# Patient Record
Sex: Male | Born: 1960 | Race: White | Hispanic: No | Marital: Married | Smoking: Never smoker
Health system: Southern US, Community
[De-identification: ages and names within clinical notes are randomized; demographics above are authoritative.]

## PROBLEM LIST (undated history)

## (undated) DIAGNOSIS — M4850XA Collapsed vertebra, not elsewhere classified, site unspecified, initial encounter for fracture: Secondary | ICD-10-CM

## (undated) DIAGNOSIS — D61818 Other pancytopenia: Secondary | ICD-10-CM

## (undated) DIAGNOSIS — C9000 Multiple myeloma not having achieved remission: Secondary | ICD-10-CM

## (undated) DIAGNOSIS — R771 Abnormality of globulin: Secondary | ICD-10-CM

## (undated) DIAGNOSIS — C7949 Secondary malignant neoplasm of other parts of nervous system: Secondary | ICD-10-CM

---

## 1983-11-07 HISTORY — PX: VASECTOMY: SHX75

## 2016-08-18 DIAGNOSIS — L812 Freckles: Secondary | ICD-10-CM | POA: Diagnosis not present

## 2016-08-18 DIAGNOSIS — D2262 Melanocytic nevi of left upper limb, including shoulder: Secondary | ICD-10-CM | POA: Diagnosis not present

## 2016-08-18 DIAGNOSIS — D2261 Melanocytic nevi of right upper limb, including shoulder: Secondary | ICD-10-CM | POA: Diagnosis not present

## 2016-08-18 DIAGNOSIS — D2272 Melanocytic nevi of left lower limb, including hip: Secondary | ICD-10-CM | POA: Diagnosis not present

## 2016-08-19 DIAGNOSIS — Z23 Encounter for immunization: Secondary | ICD-10-CM | POA: Diagnosis not present

## 2016-10-18 DIAGNOSIS — Z23 Encounter for immunization: Secondary | ICD-10-CM | POA: Diagnosis not present

## 2017-08-18 DIAGNOSIS — Z23 Encounter for immunization: Secondary | ICD-10-CM | POA: Diagnosis not present

## 2017-11-12 DIAGNOSIS — N451 Epididymitis: Secondary | ICD-10-CM | POA: Diagnosis not present

## 2017-11-12 DIAGNOSIS — M545 Low back pain: Secondary | ICD-10-CM | POA: Diagnosis not present

## 2017-11-12 DIAGNOSIS — N390 Urinary tract infection, site not specified: Secondary | ICD-10-CM | POA: Diagnosis not present

## 2017-11-12 DIAGNOSIS — N5082 Scrotal pain: Secondary | ICD-10-CM | POA: Diagnosis not present

## 2017-11-12 DIAGNOSIS — Z6841 Body Mass Index (BMI) 40.0 and over, adult: Secondary | ICD-10-CM | POA: Diagnosis not present

## 2017-11-19 DIAGNOSIS — M545 Low back pain: Secondary | ICD-10-CM | POA: Diagnosis not present

## 2017-11-22 DIAGNOSIS — N451 Epididymitis: Secondary | ICD-10-CM | POA: Diagnosis not present

## 2017-12-24 DIAGNOSIS — Z0000 Encounter for general adult medical examination without abnormal findings: Secondary | ICD-10-CM | POA: Diagnosis not present

## 2017-12-24 DIAGNOSIS — R82998 Other abnormal findings in urine: Secondary | ICD-10-CM | POA: Diagnosis not present

## 2017-12-24 DIAGNOSIS — Z125 Encounter for screening for malignant neoplasm of prostate: Secondary | ICD-10-CM | POA: Diagnosis not present

## 2017-12-31 DIAGNOSIS — Z1389 Encounter for screening for other disorder: Secondary | ICD-10-CM | POA: Diagnosis not present

## 2017-12-31 DIAGNOSIS — R740 Nonspecific elevation of levels of transaminase and lactic acid dehydrogenase [LDH]: Secondary | ICD-10-CM | POA: Diagnosis not present

## 2017-12-31 DIAGNOSIS — N451 Epididymitis: Secondary | ICD-10-CM | POA: Diagnosis not present

## 2017-12-31 DIAGNOSIS — Z0000 Encounter for general adult medical examination without abnormal findings: Secondary | ICD-10-CM | POA: Diagnosis not present

## 2017-12-31 DIAGNOSIS — M545 Low back pain: Secondary | ICD-10-CM | POA: Diagnosis not present

## 2017-12-31 DIAGNOSIS — M5489 Other dorsalgia: Secondary | ICD-10-CM | POA: Diagnosis not present

## 2018-01-01 DIAGNOSIS — M545 Low back pain: Secondary | ICD-10-CM | POA: Diagnosis not present

## 2018-01-02 DIAGNOSIS — M545 Low back pain: Secondary | ICD-10-CM | POA: Diagnosis not present

## 2018-01-03 DIAGNOSIS — Z1212 Encounter for screening for malignant neoplasm of rectum: Secondary | ICD-10-CM | POA: Diagnosis not present

## 2018-01-03 DIAGNOSIS — M545 Low back pain: Secondary | ICD-10-CM | POA: Diagnosis not present

## 2018-01-04 DIAGNOSIS — M545 Low back pain: Secondary | ICD-10-CM | POA: Diagnosis not present

## 2018-01-04 DIAGNOSIS — C9000 Multiple myeloma not having achieved remission: Secondary | ICD-10-CM

## 2018-01-04 HISTORY — DX: Multiple myeloma not having achieved remission: C90.00

## 2018-01-07 DIAGNOSIS — M545 Low back pain: Secondary | ICD-10-CM | POA: Diagnosis not present

## 2018-01-08 DIAGNOSIS — M545 Low back pain: Secondary | ICD-10-CM | POA: Diagnosis not present

## 2018-01-09 ENCOUNTER — Other Ambulatory Visit: Payer: Self-pay | Admitting: Internal Medicine

## 2018-01-09 DIAGNOSIS — M545 Low back pain: Secondary | ICD-10-CM | POA: Diagnosis not present

## 2018-01-10 ENCOUNTER — Other Ambulatory Visit (HOSPITAL_COMMUNITY): Payer: Self-pay | Admitting: Internal Medicine

## 2018-01-10 DIAGNOSIS — M5489 Other dorsalgia: Secondary | ICD-10-CM

## 2018-01-10 DIAGNOSIS — M545 Low back pain: Secondary | ICD-10-CM | POA: Diagnosis not present

## 2018-01-11 DIAGNOSIS — M545 Low back pain: Secondary | ICD-10-CM | POA: Diagnosis not present

## 2018-01-14 DIAGNOSIS — M545 Low back pain: Secondary | ICD-10-CM | POA: Diagnosis not present

## 2018-01-16 DIAGNOSIS — M545 Low back pain: Secondary | ICD-10-CM | POA: Diagnosis not present

## 2018-01-18 DIAGNOSIS — M545 Low back pain: Secondary | ICD-10-CM | POA: Diagnosis not present

## 2018-01-21 ENCOUNTER — Ambulatory Visit (HOSPITAL_COMMUNITY): Payer: BLUE CROSS/BLUE SHIELD

## 2018-01-21 DIAGNOSIS — M545 Low back pain: Secondary | ICD-10-CM | POA: Diagnosis not present

## 2018-01-23 ENCOUNTER — Encounter (HOSPITAL_COMMUNITY): Payer: Self-pay

## 2018-01-23 ENCOUNTER — Inpatient Hospital Stay (HOSPITAL_COMMUNITY)
Admission: EM | Admit: 2018-01-23 | Discharge: 2018-01-26 | DRG: 841 | Disposition: A | Discharge status: Home / Self Care | Payer: BLUE CROSS/BLUE SHIELD | Attending: Family Medicine | Admitting: Family Medicine

## 2018-01-23 ENCOUNTER — Emergency Department (HOSPITAL_COMMUNITY): Payer: BLUE CROSS/BLUE SHIELD

## 2018-01-23 ENCOUNTER — Other Ambulatory Visit: Payer: Self-pay

## 2018-01-23 DIAGNOSIS — S32000A Wedge compression fracture of unspecified lumbar vertebra, initial encounter for closed fracture: Secondary | ICD-10-CM | POA: Diagnosis not present

## 2018-01-23 DIAGNOSIS — D4989 Neoplasm of unspecified behavior of other specified sites: Secondary | ICD-10-CM | POA: Diagnosis not present

## 2018-01-23 DIAGNOSIS — D696 Thrombocytopenia, unspecified: Secondary | ICD-10-CM | POA: Diagnosis not present

## 2018-01-23 DIAGNOSIS — D7589 Other specified diseases of blood and blood-forming organs: Secondary | ICD-10-CM | POA: Diagnosis not present

## 2018-01-23 DIAGNOSIS — M545 Low back pain: Secondary | ICD-10-CM | POA: Diagnosis not present

## 2018-01-23 DIAGNOSIS — N179 Acute kidney failure, unspecified: Secondary | ICD-10-CM | POA: Diagnosis not present

## 2018-01-23 DIAGNOSIS — S32010A Wedge compression fracture of first lumbar vertebra, initial encounter for closed fracture: Secondary | ICD-10-CM | POA: Diagnosis present

## 2018-01-23 DIAGNOSIS — C9000 Multiple myeloma not having achieved remission: Principal | ICD-10-CM | POA: Diagnosis present

## 2018-01-23 DIAGNOSIS — M8458XA Pathological fracture in neoplastic disease, other specified site, initial encounter for fracture: Secondary | ICD-10-CM | POA: Diagnosis present

## 2018-01-23 DIAGNOSIS — J9811 Atelectasis: Secondary | ICD-10-CM | POA: Diagnosis not present

## 2018-01-23 DIAGNOSIS — D61818 Other pancytopenia: Secondary | ICD-10-CM | POA: Diagnosis not present

## 2018-01-23 DIAGNOSIS — K5900 Constipation, unspecified: Secondary | ICD-10-CM | POA: Diagnosis present

## 2018-01-23 DIAGNOSIS — D649 Anemia, unspecified: Secondary | ICD-10-CM

## 2018-01-23 DIAGNOSIS — R627 Adult failure to thrive: Secondary | ICD-10-CM | POA: Diagnosis not present

## 2018-01-23 DIAGNOSIS — R634 Abnormal weight loss: Secondary | ICD-10-CM | POA: Diagnosis not present

## 2018-01-23 DIAGNOSIS — M549 Dorsalgia, unspecified: Secondary | ICD-10-CM | POA: Diagnosis not present

## 2018-01-23 DIAGNOSIS — E559 Vitamin D deficiency, unspecified: Secondary | ICD-10-CM | POA: Diagnosis not present

## 2018-01-23 DIAGNOSIS — D509 Iron deficiency anemia, unspecified: Secondary | ICD-10-CM | POA: Diagnosis not present

## 2018-01-23 DIAGNOSIS — M4856XA Collapsed vertebra, not elsewhere classified, lumbar region, initial encounter for fracture: Secondary | ICD-10-CM | POA: Diagnosis not present

## 2018-01-23 DIAGNOSIS — Z6828 Body mass index (BMI) 28.0-28.9, adult: Secondary | ICD-10-CM | POA: Diagnosis not present

## 2018-01-23 DIAGNOSIS — C799 Secondary malignant neoplasm of unspecified site: Secondary | ICD-10-CM | POA: Diagnosis not present

## 2018-01-23 LAB — URINALYSIS, ROUTINE W REFLEX MICROSCOPIC
Bilirubin Urine: NEGATIVE
Glucose, UA: NEGATIVE mg/dL
KETONES UR: NEGATIVE mg/dL
Leukocytes, UA: NEGATIVE
NITRITE: NEGATIVE
PH: 5 (ref 5.0–8.0)
Protein, ur: NEGATIVE mg/dL
Specific Gravity, Urine: 1.015 (ref 1.005–1.030)

## 2018-01-23 LAB — COMPREHENSIVE METABOLIC PANEL
ALBUMIN: 4.1 g/dL (ref 3.5–5.0)
ALT: 32 U/L (ref 17–63)
ANION GAP: 13 (ref 5–15)
AST: 46 U/L — AB (ref 15–41)
Alkaline Phosphatase: 91 U/L (ref 38–126)
BUN: 51 mg/dL — ABNORMAL HIGH (ref 6–20)
CHLORIDE: 94 mmol/L — AB (ref 101–111)
CO2: 30 mmol/L (ref 22–32)
Calcium: >13 mg/dL (ref 8.9–10.3)
Creatinine, Ser: 2.62 mg/dL — ABNORMAL HIGH (ref 0.61–1.24)
GFR calc non Af Amer: 25 mL/min — ABNORMAL LOW (ref >60)
GFR, EST AFRICAN AMERICAN: 30 mL/min — AB (ref >60)
Glucose, Bld: 101 mg/dL — ABNORMAL HIGH (ref 65–99)
Potassium: 3.8 mmol/L (ref 3.5–5.1)
Sodium: 137 mmol/L (ref 135–145)
Total Bilirubin: 0.8 mg/dL (ref 0.3–1.2)
Total Protein: 6.8 g/dL (ref 6.5–8.1)

## 2018-01-23 LAB — CBG MONITORING, ED: Glucose-Capillary: 91 mg/dL (ref 65–99)

## 2018-01-23 LAB — CBC
HEMATOCRIT: 32.2 % — AB (ref 39.0–52.0)
Hemoglobin: 11 g/dL — ABNORMAL LOW (ref 13.0–17.0)
MCH: 32.4 pg (ref 26.0–34.0)
MCHC: 34.2 g/dL (ref 30.0–36.0)
MCV: 95 fL (ref 78.0–100.0)
PLATELETS: 144 10*3/uL — AB (ref 150–400)
RBC: 3.39 MIL/uL — AB (ref 4.22–5.81)
RDW: 14.2 % (ref 11.5–15.5)
WBC: 4.8 10*3/uL (ref 4.0–10.5)

## 2018-01-23 LAB — LIPASE, BLOOD: LIPASE: 31 U/L (ref 11–51)

## 2018-01-23 LAB — MAGNESIUM: Magnesium: 2.2 mg/dL (ref 1.7–2.4)

## 2018-01-23 MED ORDER — ONDANSETRON HCL 4 MG PO TABS: 4.0000 mg | ORAL_TABLET | Freq: Four times a day (QID) | ORAL | Status: DC | PRN

## 2018-01-23 MED ORDER — SODIUM CHLORIDE 0.9 % IV SOLN
Freq: Once | INTRAVENOUS | Status: AC
  Administered 2018-01-23: 23:00:00 via INTRAVENOUS

## 2018-01-23 MED ORDER — BISACODYL 10 MG RE SUPP
10.0000 mg | Freq: Every day | RECTAL | Status: DC | PRN
  Filled 2018-01-23: qty 1

## 2018-01-23 MED ORDER — CALCITONIN (SALMON) 200 UNIT/ML IJ SOLN
4.0000 [IU]/kg | Freq: Two times a day (BID) | INTRAMUSCULAR | Status: AC
  Administered 2018-01-23 – 2018-01-25 (×4): 316 [IU] via SUBCUTANEOUS
  Filled 2018-01-23 (×4): qty 1.58

## 2018-01-23 MED ORDER — SODIUM CHLORIDE 0.9 % IV SOLN
INTRAVENOUS | Status: AC
  Administered 2018-01-23 – 2018-01-24 (×2): via INTRAVENOUS

## 2018-01-23 MED ORDER — METHOCARBAMOL 1000 MG/10ML IJ SOLN
500.0000 mg | Freq: Three times a day (TID) | INTRAVENOUS | Status: DC | PRN
  Administered 2018-01-24: 500 mg via INTRAVENOUS
  Filled 2018-01-23 (×2): qty 5

## 2018-01-23 MED ORDER — ACETAMINOPHEN 650 MG RE SUPP: 650.0000 mg | Freq: Four times a day (QID) | RECTAL | Status: DC | PRN

## 2018-01-23 MED ORDER — OXYCODONE HCL 5 MG PO TABS
5.0000 mg | ORAL_TABLET | ORAL | Status: DC | PRN
  Filled 2018-01-23: qty 1

## 2018-01-23 MED ORDER — ONDANSETRON HCL 4 MG/2ML IJ SOLN
4.0000 mg | Freq: Once | INTRAMUSCULAR | Status: AC
  Administered 2018-01-23: 4 mg via INTRAVENOUS
  Filled 2018-01-23: qty 2

## 2018-01-23 MED ORDER — MORPHINE SULFATE (PF) 4 MG/ML IV SOLN
2.0000 mg | INTRAVENOUS | Status: DC | PRN
  Administered 2018-01-24: 4 mg via INTRAVENOUS
  Filled 2018-01-23: qty 1

## 2018-01-23 MED ORDER — ACETAMINOPHEN 325 MG PO TABS: 650.0000 mg | ORAL_TABLET | Freq: Four times a day (QID) | ORAL | Status: DC | PRN

## 2018-01-23 MED ORDER — ONDANSETRON HCL 4 MG/2ML IJ SOLN: 4.0000 mg | Freq: Four times a day (QID) | INTRAMUSCULAR | Status: DC | PRN

## 2018-01-23 MED ORDER — SODIUM CHLORIDE 0.9 % IV BOLUS (SEPSIS)
1000.0000 mL | Freq: Once | INTRAVENOUS | Status: AC
  Administered 2018-01-23: 1000 mL via INTRAVENOUS

## 2018-01-23 MED ORDER — SODIUM CHLORIDE 0.9 % IV SOLN
90.0000 mg | Freq: Once | INTRAVENOUS | Status: AC
  Administered 2018-01-24: 90 mg via INTRAVENOUS
  Filled 2018-01-23 (×2): qty 10

## 2018-01-23 NOTE — ED Notes (Signed)
Patient transported to X-ray 

## 2018-01-23 NOTE — ED Notes (Signed)
Pt said he has lost 30 lbs unintentionally since January due to loss of appetite.

## 2018-01-23 NOTE — ED Notes (Signed)
Pt returned to room. Admitting at bedside

## 2018-01-23 NOTE — ED Provider Notes (Signed)
  Face-to-face evaluation   History: He presents for evaluation of hyperglycemia, found today on laboratory testing greater than 16.  He has ongoing low back pain which started after shoveling snow, 3 months ago.  Calcium has been high since January, and today when he saw endocrinology for first visit it had gone from 10-16.  He also complains of decreased appetite because food does not settle well so he does not eat much.  He feels he has lost 30 pounds in 2 months.  No prior similar problem.  No known cancer.  He is sporadically taking OTC analgesia, and using multiple products, for constipation.  Physical exam: Alert, calm, cooperative.  He is lucid.  No respiratory distress.  Heart regular rate and rhythm no murmur lungs clear anteriorly.  Abdomen soft and nontender.  Medical screening examination/treatment/procedure(s) were conducted as a shared visit with non-physician practitioner(s) and myself.  I personally evaluated the patient during the encounter    Daleen Bo, MD 01/24/18 1212

## 2018-01-23 NOTE — ED Notes (Signed)
Admitting in room talking with wife, pt still in xray

## 2018-01-23 NOTE — ED Provider Notes (Signed)
[facility] EMERGENCY DEPARTMENT Provider Note   CSN: [id number] Arrival date & time: 01/23/18  1717     History   Chief Complaint Chief Complaint  Patient presents with  . Weakness    HPI Paul Green is a 57 y.o. male otherwise healthy male that was sent over by his endocrinologist today for a elevated calcium level as well as creatinine.  Patient's wife is at bedside and helps prevent history.  She has handouts of the ongoing events from the start of July 22, 2017 to today's date.  Patient had screening labs by his PCP on December 24, 2017.  At that time he was found to have an elevated calcium level (10.8).  The patient has not reported on any medication does not use Tums or other milk-alkali medications. He does not take daily medications including lithium or thiazides.  The PCP ordered a parathyroid panel which showed a PTH that was within normal limits (14 PG/mL).  He also had a normal PSA reassuring blood work. His Vitamin D level was noted to be 20.5.  Since that time he has suffered with constipation and had to take a daily stool softeners, Fleet enemas and MiraLAX.  He was referred to an endocrinologist where he reported that he had a 30 pound weight loss since January 10.  He reports he has not had an appetite and has had constant nausea and generalized fatigue as well as low back soreness.  He notes that he has had increased abdominal distention daily which he believes .  He was found to have a elevated calcium level of 16.3 on outpatient basis by his endocrinologist Madelin Rear, MD as well as a Cr of 2.6.  Patient's creatinine on 2/18 was 1.4.  The patient denies any fever, chills, chest pain, shortness of breath, cough, bowel/bladder incontinence, emesis, diarrhea, or urinary symptoms.    HPI  No past medical history on file.  There are no active problems to display for this patient.   History reviewed. No pertinent surgical  history.     Home Medications    Prior to Admission medications   Not on File    Family History No family history on file.  Social History Social History   Tobacco Use  . Smoking status: Never Smoker  . Smokeless tobacco: Never Used  Substance Use Topics  . Alcohol use: Yes    Comment: occ  . Drug use: Not on file     Allergies   Patient has no known allergies.   Review of Systems Review of Systems  All other systems reviewed and are negative.    Physical Exam Updated Vital Signs BP 132/82   Pulse 88   Temp 97.8 F (36.6 C) (Oral)   Resp 14   SpO2 100%   Physical Exam  Constitutional: He appears well-developed and well-nourished.  HENT:  Head: Normocephalic and atraumatic.  Right Ear: External ear normal.  Left Ear: External ear normal.  Nose: Nose normal.  Mouth/Throat: Uvula is midline, oropharynx is clear and moist and mucous membranes are normal. No tonsillar exudate.  Eyes: Pupils are equal, round, and reactive to light. Right eye exhibits no discharge. Left eye exhibits no discharge. No scleral icterus.  Neck: Trachea normal. Neck supple. No spinous process tenderness present. No neck rigidity. Normal range of motion present.  Cardiovascular: Normal rate, regular rhythm and intact distal pulses.  No murmur heard. Pulses:      Radial pulses are 2+ on the  right side, and 2+ on the left side.       Dorsalis pedis pulses are 2+ on the right side, and 2+ on the left side.       Posterior tibial pulses are 2+ on the right side, and 2+ on the left side.  No lower extremity swelling or edema. Calves symmetric in size bilaterally.  Pulmonary/Chest: Effort normal and breath sounds normal. He exhibits no tenderness.  Abdominal: Soft. Bowel sounds are normal. There is no tenderness. There is no rigidity, no rebound, no guarding and no CVA tenderness.  Musculoskeletal: He exhibits no edema.  Generalized tenderness to the lumbar spine.    Lymphadenopathy:     He has no cervical adenopathy.  Neurological: He is alert.  Speech clear. Follows commands. No facial droop. PERRLA. EOM grossly intact. CN III-XII grossly intact. Grossly moves all extremities 4 without ataxia. Able and appropriate strength for age to upper and lower extremities bilaterally.   Skin: Skin is warm and dry. No rash noted. He is not diaphoretic.  Psychiatric: He has a normal mood and affect.  Nursing note and vitals reviewed.    ED Treatments / Results  Labs (all labs ordered are listed, but only abnormal results are displayed) Labs Reviewed  CBC - Abnormal; Notable for the following components:      Result Value   RBC 3.39 (*)    Hemoglobin 11.0 (*)    HCT 32.2 (*)    Platelets 144 (*)    All other components within normal limits  URINALYSIS, ROUTINE W REFLEX MICROSCOPIC - Abnormal; Notable for the following components:   APPearance HAZY (*)    Hgb urine dipstick SMALL (*)    Bacteria, UA RARE (*)    Squamous Epithelial / LPF 0-5 (*)    All other components within normal limits  COMPREHENSIVE METABOLIC PANEL - Abnormal; Notable for the following components:   Chloride 94 (*)    Glucose, Bld 101 (*)    BUN 51 (*)    Creatinine, Ser 2.62 (*)    Calcium >13.0 (*)    AST 46 (*)    GFR calc non Af Amer 25 (*)    GFR calc Af Amer 30 (*)    All other components within normal limits  MAGNESIUM  LIPASE, BLOOD  CBG MONITORING, ED    EKG  EKG Interpretation  Date/Time:  Wednesday January 23 2018 18:47:44 EDT Ventricular Rate:  97 PR Interval:  138 QRS Duration: 92 QT Interval:  316 QTC Calculation: 401 R Axis:   -25 Text Interpretation:  Normal sinus rhythm Cannot rule out Anterior infarct , age undetermined Abnormal ECG No old tracing to compare Confirmed by Daleen Bo [phone]) on 01/23/2018 10:28:58 PM       Radiology No results found.  Procedures Procedures (including critical care time)  Medications Ordered in ED Medications  0.9 %  sodium  chloride infusion (not administered)  calcitonin (MIACALCIN) injection 4 Units/kg (not administered)  pamidronate (AREDIA) 90 mg in sodium chloride 0.9 % 500 mL IVPB (not administered)  sodium chloride 0.9 % bolus 1,000 mL (1,000 mLs Intravenous New Bag/Given 01/23/18 2117)  ondansetron (ZOFRAN) injection 4 mg (4 mg Intravenous Given 01/23/18 2141)     Initial Impression / Assessment and Plan / ED Course  I have reviewed the triage vital signs and the nursing notes.  Pertinent labs & imaging results that were available during my care of the patient were reviewed by me and considered in my medical  decision making (see chart for details).     57 y.o. male with no significant past medical history no no daily medications presenting to the emergency department today with generalized fatigue, muscle aches, constant nausea, 30 pound weight loss with outpatient labs showing hypercalcemia of 16.3 as well as increase of creatinine to 2.6 from baseline of 1.4 on 2/18. Labs done in the department are consistent with this.  Patient will be started on fluid bolus as well as maintenance fluids.  Will consult hospitalist for how they would like to proceed in care.   Patient's wife reports that he strained his back in September.  He has been undergoing physical therapy, TENS therapy, NSAIDs, conservative therapies since that time.  He has had reassuring x-rays.  He had outpatient MRI scheduled that was canceled due to insurance reasons.  He has had coexisting constipation with this.  There is concern of why the patient's musculoskeletal back pain has been ongoing along with his new abnormal labs.   Discussed case with [facility] hospital[facility]. They are to admit the patient.  Will order a chest x-ray and lumbar x-ray.  Spoke with pharmacist Janett Billow who advised dosing on calcitonin and bisphosphonate treatment patient's family made aware.  He appears safe for admission.  Patient case seen and discussed with Dr. Eulis Foster who  is in agreement with plan.   Please see media section for patient previous labs. I have scanned these in.   Final Clinical Impressions(s) / ED Diagnoses   Final diagnoses:  Hypercalcemia  Acute kidney injury ([place])  Weight loss  Bilateral low back pain, unspecified chronicity, with sciatica presence unspecified    ED Discharge Orders    None       Lorelle Gibbs 01/23/18 2229    Daleen Bo, MD 01/24/18 1212

## 2018-01-23 NOTE — ED Triage Notes (Signed)
Pt here for generalized weakness, abdominal pain, and back pain which has been ongoing for several days. Pt was sent here to be admitted by his endocrinologist for calcium level of 16.3 and 2.6 creatinine. Pt alert and oriented, ambulatory.

## 2018-01-23 NOTE — H&P (Signed)
Paul Green HBZ:[id number] DOB: [birth year] DOA: 01/23/2018     PCP: [name], MD   Outpatient Specialists:  endocrinologist Madelin Rear, MD    Patient arrived to ER on 01/23/18 at 1717  Patient coming from: home Lives   With family   Chief Complaint:  Chief Complaint  Patient presents with  . Weakness    HPI: Paul Green is a 57 y.o. male with No significant medical history     Presented with  Abnormal lab values.  Patient has been having generalized fatigue weight loss of 30 lb over past 2 months describes abdominal pain, loss of appetite. Reports months ago in September 2018  he hurt his back December 8th and is been hurting him ever since he was supposed to have MRI done but insurance denied payment. He started on PT in February with only partial improvement. He developed sever back spasms. The back pain has persisted, did not improve with Naproxen. Tried muscle relaxant with no improvement.  States few months back ( in February) his PCP noted slightly elevated Ca (10.8)  work up at that time was unremarkable PTH WNL 14, normal PSA, Vitamin D level 20.5 WNL he was sent to endocrinology.   Recently has been seen by endocrinologist was noted to do have calcium level up to 16.3 and increased creatinine up to 2.6 up from Feb 18 th when it was 1.4   Up from prior. He was sent to ER for further work up.  Had left testicular pain followed by urology it seemed to have resolved.  He has severe constipation partially relived by Miralax.  Had 2 BM's today No confusion but fatigue. Reports body aches. No chest pain or shortness of breath.  While in ER: Ca >13  Significant initial  Findings: Abnormal Labs Reviewed  CBC - Abnormal; Notable for the following components:      Result Value   RBC 3.39 (*)    Hemoglobin 11.0 (*)    HCT 32.2 (*)    Platelets 144 (*)    All other components within normal limits  URINALYSIS, ROUTINE W REFLEX MICROSCOPIC - Abnormal; Notable  for the following components:   APPearance HAZY (*)    Hgb urine dipstick SMALL (*)    Bacteria, UA RARE (*)    Squamous Epithelial / LPF 0-5 (*)    All other components within normal limits  COMPREHENSIVE METABOLIC PANEL - Abnormal; Notable for the following components:   Chloride 94 (*)    Glucose, Bld 101 (*)    BUN 51 (*)    Creatinine, Ser 2.62 (*)    Calcium >13.0 (*)    AST 46 (*)    GFR calc non Af Amer 25 (*)    GFR calc Af Amer 30 (*)    All other components within normal limits     Na 137 K 3.8  Cr   Up from baseline of 1.4 Lab Results  Component Value Date   CREATININE 2.62 (H) 01/23/2018   GFR: CrCl cannot be calculated (Unknown ideal weight.).  WBC  4.8      Component Value Date/Time   HGB 11.0 (L) 01/23/2018 1843   HCT 32.2 (L) 01/23/2018 1843    Lactic acid:   UA   no evidence of UTI         CXR - *NON acute   ECG:  Personally reviewed by me showing: HR : 97 Rhythm: NSR,   Ischemic changes nonspecific changes,  QTC401  Temp (24hrs), Avg:97.8 F (36.6 C), Min:97.8 F (36.6 C), Max:97.8 F (36.6 C) LABRCNTIP(cktotal:5,CKMB:5,ckmbindex:5,troponini:5) ED Triage Vitals  Enc Vitals Group     BP 01/23/18 1729 126/83     Pulse Rate 01/23/18 1729 97     Resp 01/23/18 1729 16     Temp 01/23/18 1729 97.8 F (36.6 C)     Temp Source 01/23/18 1729 Oral     SpO2 01/23/18 1729 100 %     Weight --      Height --      Head Circumference --      Peak Flow --      Pain Score 01/23/18 1842 6     Pain Loc --      Pain Edu? --      Excl. in [place]? --   TMAX(24)@     on arrival  ED Triage Vitals  Enc Vitals Group     BP 01/23/18 1729 126/83     Pulse Rate 01/23/18 1729 97     Resp 01/23/18 1729 16     Temp 01/23/18 1729 97.8 F (36.6 C)     Temp Source 01/23/18 1729 Oral     SpO2 01/23/18 1729 100 %     Weight --      Height --      Head Circumference --      Peak Flow --      Pain Score 01/23/18 1842 6     Pain Loc --      Pain Edu?  --      Excl. in [place]? --      Latest  Blood pressure (!) 142/82, pulse 83, temperature 97.8 F (36.6 C), temperature source Oral, resp. rate 18, SpO2 100 %.   Following Medications were ordered in ER: Medications  0.9 %  sodium chloride infusion (not administered)  sodium chloride 0.9 % bolus 1,000 mL (1,000 mLs Intravenous New Bag/Given 01/23/18 2117)  ondansetron [facility] [place]) injection 4 mg (4 mg Intravenous Given 01/23/18 2141)    Hospitalist was called for admission for hyperclacemia  Review of Systems:    Pertinent positives include: fatigue, weight loss, constipation,  abdominal pain,  loss of appetite, back pain.  Constitutional:  No weight loss, night sweats, Fevers, chills,  HEENT:  No headaches, Difficulty swallowing,Tooth/dental problems, Sore throat,  No sneezing, itching, ear ache, nasal congestion, post nasal drip,  Cardio-vascular:  No chest pain, Orthopnea, PND, anasarca, dizziness, palpitations.no Bilateral lower extremity swelling  GI:  No heartburn, indigestion,nausea, vomiting, diarrhea, change in bowel habits,  melena, blood in stool, hematemesis Resp:  no shortness of breath at rest. No dyspnea on exertion, No excess mucus, no productive cough, No non-productive cough, No coughing up of blood.No change in color of mucus.No wheezing. Skin:  no rash or lesions. No jaundice GU:  no dysuria, change in color of urine, no urgency or frequency. No straining to urinate.  No flank pain.  Musculoskeletal:  No joint pain or no joint swelling. No decreased range of motion. No  Psych:  No change in mood or affect. No depression or anxiety. No memory loss.  Neuro: no localizing neurological complaints, no tingling, no weakness, no double vision, no gait abnormality, no slurred speech, no confusion  As per HPI otherwise 10 point review of systems negative.   Past Medical History: Blood pressure (!) 142/82, pulse 83, temperature 97.8 F (36.6 C), temperature source Oral,  resp. rate 18, SpO2 100 %.EDMEDSNo past medical history on file.  History reviewed. No pertinent surgical history.   Social History:  Ambulatory  independently    reports that  has never smoked. he has never used smokeless tobacco. He reports that he drinks alcohol. His drug history is not on file.    Allergies:    No Known Allergies Family History:  Family History  Problem Relation Age of Onset  . Skin cancer Mother   . Atrial fibrillation Father   . Melanoma Father   . Diabetes Sister   . Melanoma Brother   . Stroke Other   . CAD Neg Hx      Prior to Admission medications   Medication Sig Start Date End Date Taking? Authorizing Provider  ondansetron (ZOFRAN-ODT) 4 MG disintegrating tablet Take 4 mg by mouth as needed. 01/23/18  Yes [provider]  polyethylene glycol (MIRALAX / GLYCOLAX) packet Take 17 g by mouth daily as needed.   Yes [provider]    Physical Exam: Prior to Admission medications   Medication Sig Start Date End Date Taking? Authorizing Provider  ondansetron (ZOFRAN-ODT) 4 MG disintegrating tablet Take 4 mg by mouth as needed. 01/23/18  Yes [provider]  polyethylene glycol (MIRALAX / GLYCOLAX) packet Take 17 g by mouth daily as needed.   Yes [provider]    1. General:  in No Acute distress   Chronically ill -appearing 2. Psychological: Alert and Oriented 3. Head/ENT:    Dry Mucous Membranes                          Head Non traumatic, neck supple                           Poor Dentition 4. SKIN:   decreased Skin turgor,  Skin clean Dry and intact no rash 5. Heart: Regular rate and rhythm no  Murmur, no Rub or gallop 6. Lungs: no wheezes or crackles   7. Abdomen: Soft,  non-tender,   non distended  bowel sounds present 8. Lower extremities: no clubbing, cyanosis, or edema 9. Neurologically   strength 5 out of 5 in all 4 extremities cranial nerves II through XII intact 10. MSK: limited range of motion  in the hips Tenderness over perispinous muscles of the lumbar spine  Patient Vitals for the past 24 hrs:  BP Temp Temp src Pulse Resp SpO2  01/23/18 2145 (!) 142/82 - - 83 18 100 %  01/23/18 2130 124/87 - - 88 13 99 %  01/23/18 2100 132/82 - - 88 14 100 %  01/23/18 2030 130/87 - - 91 (!) 21 97 %  01/23/18 2011 120/87 - - 100 12 91 %  01/23/18 1729 126/83 97.8 F (36.6 C) Oral 97 16 100 %  BMIP Recent Labs  Lab 01/23/18 1843  WBC 4.8  HGB 11.0*  HCT 32.2*  MCV 95.0  PLT 109*   Basic Metabolic Panel: Recent Labs  Lab 01/23/18 1843  NA 137  K 3.8  CL 94*  CO2 30  GLUCOSE 101*  BUN 51*  CREATININE 2.62*  CALCIUM >13.0*  MG 2.2  LABRCNTIP(wbc:5,neutroabs:5,hgb:5,hct:5,mcv:5,plt:5) Recent Labs  Lab 01/23/18 1843  NA 137  K 3.8  CL 94*  CO2 30  GLUCOSE 101*  BUN 51*  CREATININE 2.62*  CALCIUM >13.0*  MG 2.2  LABRCNTIP(ast:5,ALT:5,alkphos:5,bilitot:5,prot:5,albumin:5) Recent Labs  Lab 01/23/18 1843  LIPASE 31   No results for input(s): AMMONIA in the last  168 hours.  BNP (last 3 results) No results for input(s): PROBNP in the last 8760 hours. HbA1C: No results for input(s): HGBA1C in the last 72 hours. CBG: Recent Labs  Lab 01/23/18 1856  GLUCAP 91      Urine analysis:    Component Value Date/Time   COLORURINE YELLOW 01/23/2018 1903   APPEARANCEUR HAZY (A) 01/23/2018 1903   LABSPEC 1.015 01/23/2018 1903   PHURINE 5.0 01/23/2018 1903   GLUCOSEU NEGATIVE 01/23/2018 1903   HGBUR SMALL (A) 01/23/2018 1903   BILIRUBINUR NEGATIVE 01/23/2018 1903   KETONESUR NEGATIVE 01/23/2018 1903   PROTEINUR NEGATIVE 01/23/2018 1903   NITRITE NEGATIVE 01/23/2018 1903   LEUKOCYTESUR NEGATIVE 01/23/2018 1903     Radiological Exams on Admission: No results found.   Chart has been reviewed    Assessment/Plan  57 y.o. male with No significant medical history    Admitted for hypercalcemia  Present on Admission: . Hypercalcemia - admit to tele, rehydrate  with agressive IVF may need loop diuretics but will hold off for now given AKI, start calcitonin and pamidronate per pharmacy. Will need work up for malignancy ordered PTH related protein, SPEP/UPEP. CXR, given back pain lumbar spine imaging  . AKI (acute kidney injury) ([place]) in the setting of hypercalcemia, rehydrate, order urine electrolytes, if no improvement may need nephrology consult, work up for MM . Back pain - plain imaging showing compression fracture at L1 level. Discussed with radiology will order MRI of thoracic and Lumbar spine to evaluate further given hypercalcemia to evaluate for possible pathologic fracture.     Other plan as per orders.  DVT prophylaxis:  SCD    Code Status:  FULL CODE   as per patient    Family Communication:   Family   at  Bedside  plan of care was discussed with   Wife,    Disposition Plan:     To home once workup is complete and patient is stable                         Would benefit from PT/OT eval prior to DC  ordered                                               Consults called: none   Admission status:   inpatient       Level of care    tele     I have spent a total of 56 min on this admission   Paul Green 01/24/2018, 12:05 AM    [facility]  Pager [phone]   after 2 AM please page floor coverage PA If 7AM-7PM, please contact the day team taking care of the patient  [email or web address]  Password [email or web address]

## 2018-01-23 NOTE — ED Notes (Signed)
EDP at bedside  

## 2018-01-23 NOTE — ED Notes (Signed)
Pt. Had nausea earlier today and took one 4mg  Zofran DST at 1600 today

## 2018-01-23 NOTE — ED Notes (Signed)
Critical calcium reported to Dr. Eulis Foster

## 2018-01-24 ENCOUNTER — Other Ambulatory Visit: Payer: Self-pay

## 2018-01-24 ENCOUNTER — Encounter (HOSPITAL_COMMUNITY): Payer: Self-pay | Admitting: Radiology

## 2018-01-24 ENCOUNTER — Inpatient Hospital Stay (HOSPITAL_COMMUNITY): Payer: BLUE CROSS/BLUE SHIELD

## 2018-01-24 DIAGNOSIS — D7589 Other specified diseases of blood and blood-forming organs: Secondary | ICD-10-CM

## 2018-01-24 DIAGNOSIS — R627 Adult failure to thrive: Secondary | ICD-10-CM

## 2018-01-24 DIAGNOSIS — S32010A Wedge compression fracture of first lumbar vertebra, initial encounter for closed fracture: Secondary | ICD-10-CM

## 2018-01-24 DIAGNOSIS — D696 Thrombocytopenia, unspecified: Secondary | ICD-10-CM

## 2018-01-24 DIAGNOSIS — D509 Iron deficiency anemia, unspecified: Secondary | ICD-10-CM

## 2018-01-24 DIAGNOSIS — Z6828 Body mass index (BMI) 28.0-28.9, adult: Secondary | ICD-10-CM

## 2018-01-24 DIAGNOSIS — N179 Acute kidney failure, unspecified: Secondary | ICD-10-CM

## 2018-01-24 DIAGNOSIS — C799 Secondary malignant neoplasm of unspecified site: Secondary | ICD-10-CM

## 2018-01-24 DIAGNOSIS — M4856XA Collapsed vertebra, not elsewhere classified, lumbar region, initial encounter for fracture: Secondary | ICD-10-CM

## 2018-01-24 DIAGNOSIS — M545 Low back pain: Secondary | ICD-10-CM

## 2018-01-24 LAB — CBC WITH DIFFERENTIAL/PLATELET
BASOS PCT: 0 %
Basophils Absolute: 0 10*3/uL (ref 0.0–0.1)
EOS ABS: 0 10*3/uL (ref 0.0–0.7)
Eosinophils Relative: 0 %
HCT: 28.9 % — ABNORMAL LOW (ref 39.0–52.0)
HEMOGLOBIN: 9.9 g/dL — AB (ref 13.0–17.0)
Lymphocytes Relative: 26 %
Lymphs Abs: 0.9 10*3/uL (ref 0.7–4.0)
MCH: 32.2 pg (ref 26.0–34.0)
MCHC: 34.3 g/dL (ref 30.0–36.0)
MCV: 94.1 fL (ref 78.0–100.0)
Monocytes Absolute: 0.3 10*3/uL (ref 0.1–1.0)
Monocytes Relative: 10 %
Neutro Abs: 2.2 10*3/uL (ref 1.7–7.7)
Neutrophils Relative %: 64 %
Platelets: 110 10*3/uL — ABNORMAL LOW (ref 150–400)
RBC: 3.07 MIL/uL — AB (ref 4.22–5.81)
RDW: 14.3 % (ref 11.5–15.5)
WBC: 3.4 10*3/uL — AB (ref 4.0–10.5)

## 2018-01-24 LAB — BASIC METABOLIC PANEL
ANION GAP: 10 (ref 5–15)
ANION GAP: 7 (ref 5–15)
ANION GAP: 9 (ref 5–15)
Anion gap: 12 (ref 5–15)
BUN: 53 mg/dL — ABNORMAL HIGH (ref 6–20)
BUN: 53 mg/dL — AB (ref 6–20)
BUN: 53 mg/dL — ABNORMAL HIGH (ref 6–20)
BUN: 52 mg/dL — ABNORMAL HIGH (ref 6–20)
CALCIUM: 14.2 mg/dL — AB (ref 8.9–10.3)
CHLORIDE: 100 mmol/L — AB (ref 101–111)
CO2: 29 mmol/L (ref 22–32)
CO2: 26 mmol/L (ref 22–32)
CO2: 29 mmol/L (ref 22–32)
CO2: 26 mmol/L (ref 22–32)
CREATININE: 2.6 mg/dL — AB (ref 0.61–1.24)
Calcium: 13.3 mg/dL (ref 8.9–10.3)
Calcium: 12.8 mg/dL — ABNORMAL HIGH (ref 8.9–10.3)
Calcium: 12.2 mg/dL — ABNORMAL HIGH (ref 8.9–10.3)
Chloride: 99 mmol/L — ABNORMAL LOW (ref 101–111)
Chloride: 103 mmol/L (ref 101–111)
Chloride: 102 mmol/L (ref 101–111)
Creatinine, Ser: 2.74 mg/dL — ABNORMAL HIGH (ref 0.61–1.24)
Creatinine, Ser: 2.79 mg/dL — ABNORMAL HIGH (ref 0.61–1.24)
Creatinine, Ser: 2.72 mg/dL — ABNORMAL HIGH (ref 0.61–1.24)
GFR calc Af Amer: 28 mL/min — ABNORMAL LOW (ref >60)
GFR calc Af Amer: 27 mL/min — ABNORMAL LOW (ref >60)
GFR calc Af Amer: 28 mL/min — ABNORMAL LOW (ref >60)
GFR calc non Af Amer: 24 mL/min — ABNORMAL LOW (ref >60)
GFR calc non Af Amer: 24 mL/min — ABNORMAL LOW (ref >60)
GFR, EST AFRICAN AMERICAN: 30 mL/min — AB (ref >60)
GFR, EST NON AFRICAN AMERICAN: 26 mL/min — AB (ref >60)
GFR, EST NON AFRICAN AMERICAN: 24 mL/min — AB (ref >60)
GLUCOSE: 129 mg/dL — AB (ref 65–99)
GLUCOSE: 110 mg/dL — AB (ref 65–99)
GLUCOSE: 107 mg/dL — AB (ref 65–99)
Glucose, Bld: 108 mg/dL — ABNORMAL HIGH (ref 65–99)
POTASSIUM: 3.6 mmol/L (ref 3.5–5.1)
POTASSIUM: 3.2 mmol/L — AB (ref 3.5–5.1)
POTASSIUM: 3.3 mmol/L — AB (ref 3.5–5.1)
Potassium: 3.1 mmol/L — ABNORMAL LOW (ref 3.5–5.1)
SODIUM: 138 mmol/L (ref 135–145)
Sodium: 138 mmol/L (ref 135–145)
Sodium: 139 mmol/L (ref 135–145)
Sodium: 137 mmol/L (ref 135–145)

## 2018-01-24 LAB — CBC
HCT: 26.9 % — ABNORMAL LOW (ref 39.0–52.0)
HEMOGLOBIN: 9.1 g/dL — AB (ref 13.0–17.0)
MCH: 31.8 pg (ref 26.0–34.0)
MCHC: 33.8 g/dL (ref 30.0–36.0)
MCV: 94.1 fL (ref 78.0–100.0)
PLATELETS: 117 10*3/uL — AB (ref 150–400)
RBC: 2.86 MIL/uL — ABNORMAL LOW (ref 4.22–5.81)
RDW: 13.9 % (ref 11.5–15.5)
WBC: 3.5 10*3/uL — AB (ref 4.0–10.5)

## 2018-01-24 LAB — COMPREHENSIVE METABOLIC PANEL
ALBUMIN: 3.2 g/dL — AB (ref 3.5–5.0)
ALK PHOS: 71 U/L (ref 38–126)
ALT: 26 U/L (ref 17–63)
ANION GAP: 9 (ref 5–15)
AST: 36 U/L (ref 15–41)
BILIRUBIN TOTAL: 0.7 mg/dL (ref 0.3–1.2)
BUN: 53 mg/dL — AB (ref 6–20)
CALCIUM: 13.3 mg/dL — AB (ref 8.9–10.3)
CO2: 28 mmol/L (ref 22–32)
Chloride: 100 mmol/L — ABNORMAL LOW (ref 101–111)
Creatinine, Ser: 2.67 mg/dL — ABNORMAL HIGH (ref 0.61–1.24)
GFR calc Af Amer: 29 mL/min — ABNORMAL LOW (ref >60)
GFR calc non Af Amer: 25 mL/min — ABNORMAL LOW (ref >60)
GLUCOSE: 111 mg/dL — AB (ref 65–99)
POTASSIUM: 3.3 mmol/L — AB (ref 3.5–5.1)
SODIUM: 137 mmol/L (ref 135–145)
Total Protein: 5.6 g/dL — ABNORMAL LOW (ref 6.5–8.1)

## 2018-01-24 LAB — IRON AND TIBC
Iron: 70 ug/dL (ref 45–182)
Saturation Ratios: 32 % (ref 17.9–39.5)
TIBC: 218 ug/dL — ABNORMAL LOW (ref 250–450)
UIBC: 148 ug/dL

## 2018-01-24 LAB — CREATININE, URINE, RANDOM: CREATININE, URINE: 106.67 mg/dL

## 2018-01-24 LAB — FOLATE: FOLATE: 9.3 ng/mL (ref >5.9)

## 2018-01-24 LAB — MAGNESIUM: Magnesium: 1.8 mg/dL (ref 1.7–2.4)

## 2018-01-24 LAB — RETICULOCYTES
RBC.: 2.86 MIL/uL — ABNORMAL LOW (ref 4.22–5.81)
Retic Count, Absolute: 54.3 10*3/uL (ref 19.0–186.0)
Retic Ct Pct: 1.9 % (ref 0.4–3.1)

## 2018-01-24 LAB — PHOSPHORUS
PHOSPHORUS: 2.9 mg/dL (ref 2.5–4.6)
Phosphorus: 3.5 mg/dL (ref 2.5–4.6)

## 2018-01-24 LAB — HIV ANTIBODY (ROUTINE TESTING W REFLEX): HIV Screen 4th Generation wRfx: NONREACTIVE

## 2018-01-24 LAB — FERRITIN: FERRITIN: 1214 ng/mL — AB (ref 24–336)

## 2018-01-24 LAB — VITAMIN B12: Vitamin B-12: 186 pg/mL (ref 180–914)

## 2018-01-24 LAB — CK: CK TOTAL: 134 U/L (ref 49–397)

## 2018-01-24 LAB — TSH: TSH: 1.019 u[IU]/mL (ref 0.350–4.500)

## 2018-01-24 LAB — SODIUM, URINE, RANDOM

## 2018-01-24 MED ORDER — ENSURE ENLIVE PO LIQD
237.0000 mL | Freq: Two times a day (BID) | ORAL | Status: DC
  Administered 2018-01-24 – 2018-01-25 (×2): 237 mL via ORAL

## 2018-01-24 MED ORDER — SODIUM CHLORIDE 0.9 % IV SOLN: INTRAVENOUS | Status: AC

## 2018-01-24 NOTE — Progress Notes (Signed)
PROGRESS NOTE    Paul Green  SWH:[id number] DOB: August 01, 1961 DOA: 01/23/2018 PCP: [name], MD   Brief Narrative: Paul Green is a 57 y.o. male with no medical history. He presented with back pain and weakness and found to have metastatic disease and associated pathologic fracture. Oncology consulted.   Assessment & Plan:   Active Problems:   Hypercalcemia   AKI (acute kidney injury) ([place])   Back pain   Closed compression fracture of L1 lumbar vertebra ([facility])   Hypercalcemia Improved slightly with IV fluids -Continue IV fluids -Oncology recommendations  Metastatic cancer Unknown source. Concern for multiple myeloma. Multiple lesions down thoracic and lumbar spine. No spinal cord impingement. -Oncology consulted and will see today -SPEP/UPEP pending; other labs per oncology  Compression fracture Pathologic secondary to metastatic disease. L1, L3 lumbar vertebra. -analgesics  Acute kidney injury Unknown baseline. Possible this could be chronic. -IV fluids  Back pain Related to above. -analgesics   DVT prophylaxis: Lovenox Code Status:   Code Status: Full Code Family Communication: Wife at bedside, daughter came in near end of evaluation Disposition Plan: Pending oncology workup/improvement of hypercalcemia   Consultants:   Oncology  Procedures:   None  Antimicrobials:  None    Subjective: Feels good today.  Objective: Vitals:   01/24/18 0048 01/24/18 0601 01/24/18 0757 01/24/18 1123  BP: 116/77 117/71 119/69 128/74  Pulse: 93 75 85 79  Resp: '16 18 18 18  ' Temp: 98.1 F (36.7 C) 97.8 F (36.6 C) 97.8 F (36.6 C) 98.6 F (37 C)  TempSrc: Oral Oral Oral Oral  SpO2: 95% 95% 96% 94%  Weight: 80.6 kg (177 lb 11.2 oz)     Height: '5\' 6"'  (1.676 m)       Intake/Output Summary (Last 24 hours) at 01/24/2018 1247 Last data filed at 01/24/2018 1244 Gross per 24 hour  Intake 3340 ml  Output 1400 ml  Net 1940 ml   Filed Weights   01/23/18  2232 01/24/18 0048  Weight: 78.9 kg (174 lb) 80.6 kg (177 lb 11.2 oz)    Examination:  General exam: Appears calm and comfortable Respiratory system: Clear to auscultation. Respiratory effort normal. Cardiovascular system: S1 & S2 heard, RRR. No murmurs, rubs, gallops or clicks. Gastrointestinal system: Abdomen is nondistended, soft and nontender. No organomegaly or masses felt. Normal bowel sounds heard. Central nervous system: Alert and oriented. No focal neurological deficits. Extremities: No edema. No calf tenderness Skin: No cyanosis. No rashes Psychiatry: Judgement and insight appear normal. Mood & affect appropriate.     Data Reviewed: I have personally reviewed following labs and imaging studies  CBC: Recent Labs  Lab 01/23/18 1843 01/24/18 0109 01/24/18 0327  WBC 4.8 3.4* 3.5*  NEUTROABS  --  2.2  --   HGB 11.0* 9.9* 9.1*  HCT 32.2* 28.9* 26.9*  MCV 95.0 94.1 94.1  PLT 144* 110* 665*   Basic Metabolic Panel: Recent Labs  Lab 01/23/18 1843 01/24/18 0109 01/24/18 0327 01/24/18 0753  NA 137 138 137 138  K 3.8 3.1* 3.3* 3.6  CL 94* 99* 100* 100*  CO2 '30 29 28 26  ' GLUCOSE 101* 108* 111* 129*  BUN 51* 53* 53* 53*  CREATININE 2.62* 2.60* 2.67* 2.74*  CALCIUM >13.0* 14.2* 13.3* 13.3*  MG 2.2  --  1.8  --   PHOS  --  2.9 3.5  --    GFR: Estimated Creatinine Clearance: 29.7 mL/min (A) (by C-G formula based on SCr of 2.74 mg/dL (H)). Liver Function  Tests: Recent Labs  Lab 01/23/18 1843 01/24/18 0327  AST 46* 36  ALT 32 26  ALKPHOS 91 71  BILITOT 0.8 0.7  PROT 6.8 5.6*  ALBUMIN 4.1 3.2*   Recent Labs  Lab 01/23/18 1843  LIPASE 31   No results for input(s): AMMONIA in the last 168 hours. Coagulation Profile: No results for input(s): INR, PROTIME in the last 168 hours. Cardiac Enzymes: Recent Labs  Lab 01/24/18 0109  CKTOTAL 134   BNP (last 3 results) No results for input(s): PROBNP in the last 8760 hours. HbA1C: No results for input(s):  HGBA1C in the last 72 hours. CBG: Recent Labs  Lab 01/23/18 1856  GLUCAP 91   Lipid Profile: No results for input(s): CHOL, HDL, LDLCALC, TRIG, CHOLHDL, LDLDIRECT in the last 72 hours. Thyroid Function Tests: Recent Labs    01/24/18 0109  TSH 1.019   Anemia Panel: Recent Labs    01/24/18 0327  VITAMINB12 186  FOLATE 9.3  FERRITIN 1,214*  TIBC 218*  IRON 70  RETICCTPCT 1.9   Sepsis Labs: No results for input(s): PROCALCITON, LATICACIDVEN in the last 168 hours.  No results found for this or any previous visit (from the past 240 hour(s)).       Radiology Studies: Dg Chest 2 View  Result Date: 01/23/2018 CLINICAL DATA:  Generalized weakness. 30 pound weight loss. Hypercalcemia. EXAM: CHEST - 2 VIEW COMPARISON:  None. FINDINGS: Low lung volumes. Normal heart size and mediastinal contours with mild aortic atherosclerosis. Streaky bibasilar opacities are likely atelectasis. No evidence of focal pulmonary mass. No pulmonary edema, pleural effusion or pneumothorax. No acute osseous abnormalities are seen. IMPRESSION: Low lung volumes with bibasilar atelectasis. Electronically Signed   By: Jeb Levering M.D.   On: 01/23/2018 23:35   Dg Lumbar Spine Complete  Result Date: 01/23/2018 CLINICAL DATA:  30 pound weight loss. Hypercalcemia. Generalized weakness and back pain. EXAM: LUMBAR SPINE - COMPLETE 4+ VIEW COMPARISON:  None. FINDINGS: L1 compression fracture with approximately 40% loss of height anteriorly. No posterior cortex involvement. Remaining lumbar vertebral body heights are maintained. Diffuse endplate spurring with mild diffuse disc space narrowing. The alignment is maintained. Sacroiliac joints are congruent. No radiographic evidence of focal bone lesion. IMPRESSION: 1. L1 compression fracture with 40% loss of height anteriorly, age indeterminate. 2. Mild diffuse spondylosis with endplate spurring and diffuse disc space narrowing. Electronically Signed   By: Jeb Levering M.D.   On: 01/23/2018 23:37   Mr Thoracic Spine Wo Contrast  Result Date: 01/24/2018 CLINICAL DATA:  57 year old male with unexplained 30 lb weight loss, fatigue. Back pain since December, which recently became severe with back spasm. Hypercalcemia. L1 compression fracture on lumbar radiographs yesterday. IV contrast was deferred at this time due to renal insufficiency (estimated GFR 25). EXAM: MRI THORACIC AND LUMBAR SPINE WITHOUT CONTRAST TECHNIQUE: Multiplanar and multiecho pulse sequences of the thoracic and lumbar spine were obtained without intravenous contrast. COMPARISON:  Chest and lumbar radiographs 01/23/2018. FINDINGS: MRI THORACIC SPINE FINDINGS Limited cervical spine imaging: Diffusely abnormal bone marrow signal throughout the cervical spine, vertebral and posterior element involvement. Possible C6 ventral epidural extension of the abnormality with mild C6-C7 spinal stenosis suspected (series 19001, image 6). Similar abnormal marrow signal at the skull base. Abnormal marrow signal in the visible sternum and manubrium. Thoracic spine segmentation:  Normal. Alignment: Preserved thoracic vertebral height and kyphosis. Borderline to mild loss of mid and lower thoracic vertebral height such as T7 and T8. Vertebrae: Diffusely abnormal marrow  signal throughout the thoracic vertebrae including vertebral body, pedicle, posterior element involvement, and bilateral rib involvement. Abnormally expanded left T4 pedicle with early extension of the abnormality and to the left ventral epidural space and the left T4 neural foramen (series 8001, image 13). More moderate ventral epidural extension of tumor at the T8 and T9 levels (images 25 and 28). The appearance is more pronounced at T9 in greater on the right. But there is no significant spinal stenosis. However, there is moderate involvement of the right T9 neural foramen which appears moderately to severely narrow. Mild-to-moderate ventral epidural  extension at T11. Moderate left T11 neural foraminal involvement and mild to moderate foraminal stenosis. Similar epidural and right foraminal involvement at T12, with moderate to severe right T12 foraminal stenosis. Cord: Thoracic spinal cord signal and morphology remains normal at all levels. There is borderline to Mild spinal stenosis at T9 (series 23001, image 28). The conus medullaris is normal at T12-L1. Paraspinal and other soft tissues: Intermittent wrap artifact on axial images. Negative visible thoracic and upper abdominal viscera. The posterior paraspinal soft tissues remain within normal limits. Disc levels: No significant disc degeneration. MRI LUMBAR SPINE FINDINGS Segmentation: Normal, concordant with the thoracic spine numbering today. Alignment:  Stable alignment.  No spondylolisthesis. Vertebrae: Diffusely abnormal marrow signal throughout the visible lumbosacral spine and pelvis in keeping with the cervical and thoracic findings. Moderate compression fracture of L1 with central loss of vertebral body height up to 52%. Early ventral epidural extension of tumor on the left side (series 4001, image 9). Early left foraminal involvement without definite foraminal stenosis. Similar early ventral epidural and foraminal involvement on the left at L2. No stenosis. Mild compression fracture of the right lateral L3 vertebral body best demonstrated on series 6001, image 13. Early right L3 neural foraminal involvement without stenosis. Early ventral epidural tumor involvement at L4. No malignant stenosis at that level, although there is combined mild to moderate right L4 foraminal stenosis which is in part related to degenerative facet hypertrophy. Early ventral epidural involvement at L5.  No stenosis. Diffuse visible sacral and pelvic involvement with probable ventral presacral space extension of tumor. Conus medullaris and cauda equina: Conus extends to the T12-L1 level. No thickening of the cauda equina  nerve roots. Paraspinal and other soft tissues: Moderate edema in the medial right psoas muscle from L2-L3 to the L5 level. Patchy edema in the medial erector spinae muscles at the L3 through L5 spinous process levels (series 6001, image 1). Negative visible abdominal viscera. No retroperitoneal lymphadenopathy. Disc levels: No significant disc degeneration. IMPRESSION: 1. 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 Top differential considerations include lymphoma, multiple myeloma, and widespread metastatic disease. 2. Multilevel tumor extension into the epidural space, although generally mild. There is borderline to mild malignant spinal stenosis suspected at both C6 and T9. No spinal cord mass effect or signal abnormality. 3. Multilevel tumor extension into the neural foramina: Left T4, right T9, left T11, right T12, right L3 nerve levels. 4. Pathologic compression fractures of L1 (50%) and the right L3 (mild) vertebral bodies. Minimal to mild loss of midthoracic vertebral body height. 5. Reactive medial right psoas muscle and medial lumbar erector spinae muscle edema. 6. Negative partially visible thoracic and abdominal viscera. Electronically Signed   By: Genevie Ann M.D.   On: 01/24/2018 10:32   Mr Lumbar Spine Wo Contrast  Result Date: 01/24/2018 CLINICAL  DATA:  57 year old male with unexplained 30 lb weight loss, fatigue. Back pain since December, which recently became severe with back spasm. Hypercalcemia. L1 compression fracture on lumbar radiographs yesterday. IV contrast was deferred at this time due to renal insufficiency (estimated GFR 25). EXAM: MRI THORACIC AND LUMBAR SPINE WITHOUT CONTRAST TECHNIQUE: Multiplanar and multiecho pulse sequences of the thoracic and lumbar spine were obtained without intravenous contrast. COMPARISON:  Chest and lumbar radiographs 01/23/2018. FINDINGS:  MRI THORACIC SPINE FINDINGS Limited cervical spine imaging: Diffusely abnormal bone marrow signal throughout the cervical spine, vertebral and posterior element involvement. Possible C6 ventral epidural extension of the abnormality with mild C6-C7 spinal stenosis suspected (series 19001, image 6). Similar abnormal marrow signal at the skull base. Abnormal marrow signal in the visible sternum and manubrium. Thoracic spine segmentation:  Normal. Alignment: Preserved thoracic vertebral height and kyphosis. Borderline to mild loss of mid and lower thoracic vertebral height such as T7 and T8. Vertebrae: Diffusely abnormal marrow signal throughout the thoracic vertebrae including vertebral body, pedicle, posterior element involvement, and bilateral rib involvement. Abnormally expanded left T4 pedicle with early extension of the abnormality and to the left ventral epidural space and the left T4 neural foramen (series 8001, image 13). More moderate ventral epidural extension of tumor at the T8 and T9 levels (images 25 and 28). The appearance is more pronounced at T9 in greater on the right. But there is no significant spinal stenosis. However, there is moderate involvement of the right T9 neural foramen which appears moderately to severely narrow. Mild-to-moderate ventral epidural extension at T11. Moderate left T11 neural foraminal involvement and mild to moderate foraminal stenosis. Similar epidural and right foraminal involvement at T12, with moderate to severe right T12 foraminal stenosis. Cord: Thoracic spinal cord signal and morphology remains normal at all levels. There is borderline to Mild spinal stenosis at T9 (series 23001, image 28). The conus medullaris is normal at T12-L1. Paraspinal and other soft tissues: Intermittent wrap artifact on axial images. Negative visible thoracic and upper abdominal viscera. The posterior paraspinal soft tissues remain within normal limits. Disc levels: No significant disc  degeneration. MRI LUMBAR SPINE FINDINGS Segmentation: Normal, concordant with the thoracic spine numbering today. Alignment:  Stable alignment.  No spondylolisthesis. Vertebrae: Diffusely abnormal marrow signal throughout the visible lumbosacral spine and pelvis in keeping with the cervical and thoracic findings. Moderate compression fracture of L1 with central loss of vertebral body height up to 52%. Early ventral epidural extension of tumor on the left side (series 4001, image 9). Early left foraminal involvement without definite foraminal stenosis. Similar early ventral epidural and foraminal involvement on the left at L2. No stenosis. Mild compression fracture of the right lateral L3 vertebral body best demonstrated on series 6001, image 13. Early right L3 neural foraminal involvement without stenosis. Early ventral epidural tumor involvement at L4. No malignant stenosis at that level, although there is combined mild to moderate right L4 foraminal stenosis which is in part related to degenerative facet hypertrophy. Early ventral epidural involvement at L5.  No stenosis. Diffuse visible sacral and pelvic involvement with probable ventral presacral space extension of tumor. Conus medullaris and cauda equina: Conus extends to the T12-L1 level. No thickening of the cauda equina nerve roots. Paraspinal and other soft tissues: Moderate edema in the medial right psoas muscle from L2-L3 to the L5 level. Patchy edema in the medial erector spinae muscles at the L3 through L5 spinous process levels (series 6001, image 1). Negative visible abdominal viscera. No retroperitoneal  lymphadenopathy. Disc levels: No significant disc degeneration. IMPRESSION: 1. 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 Top differential considerations include lymphoma, multiple myeloma, and widespread metastatic disease. 2.  Multilevel tumor extension into the epidural space, although generally mild. There is borderline to mild malignant spinal stenosis suspected at both C6 and T9. No spinal cord mass effect or signal abnormality. 3. Multilevel tumor extension into the neural foramina: Left T4, right T9, left T11, right T12, right L3 nerve levels. 4. Pathologic compression fractures of L1 (50%) and the right L3 (mild) vertebral bodies. Minimal to mild loss of midthoracic vertebral body height. 5. Reactive medial right psoas muscle and medial lumbar erector spinae muscle edema. 6. Negative partially visible thoracic and abdominal viscera. Electronically Signed   By: Genevie Ann M.D.   On: 01/24/2018 10:32        Scheduled Meds: . calcitonin  4 Units/kg Subcutaneous BID  . feeding supplement (ENSURE ENLIVE)  237 mL Oral BID BM   Continuous Infusions: . methocarbamol (ROBAXIN)  IV Stopped (01/24/18 0146)     LOS: 1 day     Cordelia Poche, MD [facility] 01/24/2018, 12:47 PM Pager: [phone]  If 7PM-7AM, please contact night-coverage [email or web address] Password [email or web address] 01/24/2018, 12:47 PM

## 2018-01-24 NOTE — Consult Note (Signed)
New Hematology/Oncology Consult   Referral MD: Cordelia Poche      Reason for Referral: Anemia, hypercalcemia  HPI: [name] reports back pain beginning in December.  The back pain occurred acutely when bending over, but has persisted.  He has calcium was mildly elevated when he saw Dr. Ardeth Perfect last month.  A chemistry panel on 01/02/2018, creatinine at 1.3 with a calcium level of 11.  On 12/24/2017 the hemoglobin returned at 14.3 with a platelet count of 127,000.  A PTH level returned in the normal range. He was referred to endocrinology yesterday and was noted to have a calcium of 16.3 and creatinine of 2.6.  He was referred to the emergency room for further evaluation.  He was admitted and treated with intravenous hydration, calcitonin, and pamidronate.  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  Multilevel tumor extension was noted into the epidural space-mild.  No spinal cord mass-effect.  Multilevel tumor extension into neural foramina.  There is a 50% pathologic compression fracture in L1 and an L3 compression fracture.    History reviewed. No pertinent past medical history.:  Past Surgical History:  Procedure Laterality Date  . VASECTOMY  1985  :   Current Facility-Administered Medications:  .  acetaminophen (TYLENOL) tablet 650 mg, 650 mg, Oral, Q6H PRN **OR** acetaminophen (TYLENOL) suppository 650 mg, 650 mg, Rectal, Q6H PRN, Doutova, Anastassia, MD .  bisacodyl (DULCOLAX) suppository 10 mg, 10 mg, Rectal, Daily PRN, Doutova, Anastassia, MD .  calcitonin (MIACALCIN) injection 316 Units, 4 Units/kg, Subcutaneous, BID, Jillyn Ledger, PA-C, 316 Units at 01/24/18 1038 .  feeding supplement (ENSURE ENLIVE) (ENSURE ENLIVE) liquid 237 mL, 237 mL, Oral, BID BM, Doutova, Anastassia, MD, 237 mL at 01/24/18 1040 .  methocarbamol (ROBAXIN) 500 mg in dextrose 5 % 50 mL IVPB, 500 mg,  Intravenous, Q8H PRN, Toy Baker, MD, Stopped at 01/24/18 0146 .  morphine 4 MG/ML injection 2-4 mg, 2-4 mg, Intravenous, Q4H PRN, Doutova, Anastassia, MD, 4 mg at 01/24/18 0809 .  ondansetron (ZOFRAN) tablet 4 mg, 4 mg, Oral, Q6H PRN **OR** ondansetron (ZOFRAN) injection 4 mg, 4 mg, Intravenous, Q6H PRN, Doutova, Anastassia, MD .  oxyCODONE (Oxy IR/ROXICODONE) immediate release tablet 5 mg, 5 mg, Oral, Q4H PRN, Doutova, Anastassia, MD:  . calcitonin  4 Units/kg Subcutaneous BID  . feeding supplement (ENSURE ENLIVE)  237 mL Oral BID BM  :  No Known Allergies:  FH: No family history of cancer  SOCIAL HISTORY: He lives with his wife in [place].  He is a retired [employer] transport manager.  He does not use cigarettes.  Rare alcohol use.  No transfusion history.  No risk factor for HIV or hepatitis  Review of Systems:  Positives include: Anorexia, 30 pound weight loss over 3 months, nausea and vomiting 01/21/2018 and 01/22/2018, diffuse back pain, lethargy, left testicle pain-evaluated by urology  A complete ROS was otherwise negative.   Physical Exam:  Blood pressure 128/74, pulse 79, temperature 98.6 F (37 C), temperature source Oral, resp. rate 18, height '5\' 6"'  (1.676 m), weight 177 lb 11.2 oz (80.6 kg), SpO2 94 %.  HEENT: Oropharynx not visible mass, neck without mass Lungs: Clear bilaterally, no respiratory distress Cardiac: Regular rate and rhythm Abdomen: No hepatosplenomegaly, no mass, nontender GU: Testes without mass Vascular: No leg edema Lymph nodes: No cervical, supraclavicular, axillary, or inguinal nodes Neurologic: The motor exam appears intact in the upper  and lower extremities Skin: No rash Musculoskeletal: Tender to percussion throughout the spine and iliac regions  LABS:  Recent Labs    01/24/18 0109 01/24/18 0327  WBC 3.4* 3.5*  HGB 9.9* 9.1*  HCT 28.9* 26.9*  PLT 110* 117*  ANC 2.2  Recent Labs    01/24/18 0327 01/24/18 0753  NA 137 138  K  3.3* 3.6  CL 100* 100*  CO2 28 26  GLUCOSE 111* 129*  BUN 53* 53*  CREATININE 2.67* 2.74*  CALCIUM 13.3* 13.3*  Vitamin B12-186    RADIOLOGY:  Dg Chest 2 View  Result Date: 01/23/2018 CLINICAL DATA:  Generalized weakness. 30 pound weight loss. Hypercalcemia. EXAM: CHEST - 2 VIEW COMPARISON:  None. FINDINGS: Low lung volumes. Normal heart size and mediastinal contours with mild aortic atherosclerosis. Streaky bibasilar opacities are likely atelectasis. No evidence of focal pulmonary mass. No pulmonary edema, pleural effusion or pneumothorax. No acute osseous abnormalities are seen. IMPRESSION: Low lung volumes with bibasilar atelectasis. Electronically Signed   By: Jeb Levering M.D.   On: 01/23/2018 23:35   Dg Lumbar Spine Complete  Result Date: 01/23/2018 CLINICAL DATA:  30 pound weight loss. Hypercalcemia. Generalized weakness and back pain. EXAM: LUMBAR SPINE - COMPLETE 4+ VIEW COMPARISON:  None. FINDINGS: L1 compression fracture with approximately 40% loss of height anteriorly. No posterior cortex involvement. Remaining lumbar vertebral body heights are maintained. Diffuse endplate spurring with mild diffuse disc space narrowing. The alignment is maintained. Sacroiliac joints are congruent. No radiographic evidence of focal bone lesion. IMPRESSION: 1. L1 compression fracture with 40% loss of height anteriorly, age indeterminate. 2. Mild diffuse spondylosis with endplate spurring and diffuse disc space narrowing. Electronically Signed   By: Jeb Levering M.D.   On: 01/23/2018 23:37   Mr Thoracic Spine Wo Contrast  Result Date: 01/24/2018 CLINICAL DATA:  57 year old male with unexplained 30 lb weight loss, fatigue. Back pain since December, which recently became severe with back spasm. Hypercalcemia. L1 compression fracture on lumbar radiographs yesterday. IV contrast was deferred at this time due to renal insufficiency (estimated GFR 25). EXAM: MRI THORACIC AND LUMBAR SPINE WITHOUT  CONTRAST TECHNIQUE: Multiplanar and multiecho pulse sequences of the thoracic and lumbar spine were obtained without intravenous contrast. COMPARISON:  Chest and lumbar radiographs 01/23/2018. FINDINGS: MRI THORACIC SPINE FINDINGS Limited cervical spine imaging: Diffusely abnormal bone marrow signal throughout the cervical spine, vertebral and posterior element involvement. Possible C6 ventral epidural extension of the abnormality with mild C6-C7 spinal stenosis suspected (series 19001, image 6). Similar abnormal marrow signal at the skull base. Abnormal marrow signal in the visible sternum and manubrium. Thoracic spine segmentation:  Normal. Alignment: Preserved thoracic vertebral height and kyphosis. Borderline to mild loss of mid and lower thoracic vertebral height such as T7 and T8. Vertebrae: Diffusely abnormal marrow signal throughout the thoracic vertebrae including vertebral body, pedicle, posterior element involvement, and bilateral rib involvement. Abnormally expanded left T4 pedicle with early extension of the abnormality and to the left ventral epidural space and the left T4 neural foramen (series 8001, image 13). More moderate ventral epidural extension of tumor at the T8 and T9 levels (images 25 and 28). The appearance is more pronounced at T9 in greater on the right. But there is no significant spinal stenosis. However, there is moderate involvement of the right T9 neural foramen which appears moderately to severely narrow. Mild-to-moderate ventral epidural extension at T11. Moderate left T11 neural foraminal involvement and mild to moderate foraminal stenosis. Similar epidural and right  foraminal involvement at T12, with moderate to severe right T12 foraminal stenosis. Cord: Thoracic spinal cord signal and morphology remains normal at all levels. There is borderline to Mild spinal stenosis at T9 (series 23001, image 28). The conus medullaris is normal at T12-L1. Paraspinal and other soft tissues:  Intermittent wrap artifact on axial images. Negative visible thoracic and upper abdominal viscera. The posterior paraspinal soft tissues remain within normal limits. Disc levels: No significant disc degeneration. MRI LUMBAR SPINE FINDINGS Segmentation: Normal, concordant with the thoracic spine numbering today. Alignment:  Stable alignment.  No spondylolisthesis. Vertebrae: Diffusely abnormal marrow signal throughout the visible lumbosacral spine and pelvis in keeping with the cervical and thoracic findings. Moderate compression fracture of L1 with central loss of vertebral body height up to 52%. Early ventral epidural extension of tumor on the left side (series 4001, image 9). Early left foraminal involvement without definite foraminal stenosis. Similar early ventral epidural and foraminal involvement on the left at L2. No stenosis. Mild compression fracture of the right lateral L3 vertebral body best demonstrated on series 6001, image 13. Early right L3 neural foraminal involvement without stenosis. Early ventral epidural tumor involvement at L4. No malignant stenosis at that level, although there is combined mild to moderate right L4 foraminal stenosis which is in part related to degenerative facet hypertrophy. Early ventral epidural involvement at L5.  No stenosis. Diffuse visible sacral and pelvic involvement with probable ventral presacral space extension of tumor. Conus medullaris and cauda equina: Conus extends to the T12-L1 level. No thickening of the cauda equina nerve roots. Paraspinal and other soft tissues: Moderate edema in the medial right psoas muscle from L2-L3 to the L5 level. Patchy edema in the medial erector spinae muscles at the L3 through L5 spinous process levels (series 6001, image 1). Negative visible abdominal viscera. No retroperitoneal lymphadenopathy. Disc levels: No significant disc degeneration. IMPRESSION: 1. 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 Top differential considerations include lymphoma, multiple myeloma, and widespread metastatic disease. 2. Multilevel tumor extension into the epidural space, although generally mild. There is borderline to mild malignant spinal stenosis suspected at both C6 and T9. No spinal cord mass effect or signal abnormality. 3. Multilevel tumor extension into the neural foramina: Left T4, right T9, left T11, right T12, right L3 nerve levels. 4. Pathologic compression fractures of L1 (50%) and the right L3 (mild) vertebral bodies. Minimal to mild loss of midthoracic vertebral body height. 5. Reactive medial right psoas muscle and medial lumbar erector spinae muscle edema. 6. Negative partially visible thoracic and abdominal viscera. Electronically Signed   By: Genevie Ann M.D.   On: 01/24/2018 10:32   Mr Lumbar Spine Wo Contrast  Result Date: 01/24/2018 CLINICAL DATA:  57 year old male with unexplained 30 lb weight loss, fatigue. Back pain since December, which recently became severe with back spasm. Hypercalcemia. L1 compression fracture on lumbar radiographs yesterday. IV contrast was deferred at this time due to renal insufficiency (estimated GFR 25). EXAM: MRI THORACIC AND LUMBAR SPINE WITHOUT CONTRAST TECHNIQUE: Multiplanar and multiecho pulse sequences of the thoracic and lumbar spine were obtained without intravenous contrast. COMPARISON:  Chest and lumbar radiographs 01/23/2018. FINDINGS: MRI THORACIC SPINE FINDINGS Limited cervical spine imaging: Diffusely abnormal bone marrow signal throughout the cervical spine, vertebral and posterior element involvement. Possible C6 ventral epidural extension of the abnormality with mild C6-C7 spinal stenosis suspected (series 19001, image 6). Similar abnormal  marrow signal at the skull base. Abnormal marrow signal in the visible sternum and manubrium. Thoracic spine segmentation:  Normal.  Alignment: Preserved thoracic vertebral height and kyphosis. Borderline to mild loss of mid and lower thoracic vertebral height such as T7 and T8. Vertebrae: Diffusely abnormal marrow signal throughout the thoracic vertebrae including vertebral body, pedicle, posterior element involvement, and bilateral rib involvement. Abnormally expanded left T4 pedicle with early extension of the abnormality and to the left ventral epidural space and the left T4 neural foramen (series 8001, image 13). More moderate ventral epidural extension of tumor at the T8 and T9 levels (images 25 and 28). The appearance is more pronounced at T9 in greater on the right. But there is no significant spinal stenosis. However, there is moderate involvement of the right T9 neural foramen which appears moderately to severely narrow. Mild-to-moderate ventral epidural extension at T11. Moderate left T11 neural foraminal involvement and mild to moderate foraminal stenosis. Similar epidural and right foraminal involvement at T12, with moderate to severe right T12 foraminal stenosis. Cord: Thoracic spinal cord signal and morphology remains normal at all levels. There is borderline to Mild spinal stenosis at T9 (series 23001, image 28). The conus medullaris is normal at T12-L1. Paraspinal and other soft tissues: Intermittent wrap artifact on axial images. Negative visible thoracic and upper abdominal viscera. The posterior paraspinal soft tissues remain within normal limits. Disc levels: No significant disc degeneration. MRI LUMBAR SPINE FINDINGS Segmentation: Normal, concordant with the thoracic spine numbering today. Alignment:  Stable alignment.  No spondylolisthesis. Vertebrae: Diffusely abnormal marrow signal throughout the visible lumbosacral spine and pelvis in keeping with the cervical and thoracic findings. Moderate compression fracture of L1 with central loss of vertebral body height up to 52%. Early ventral epidural extension of tumor on the  left side (series 4001, image 9). Early left foraminal involvement without definite foraminal stenosis. Similar early ventral epidural and foraminal involvement on the left at L2. No stenosis. Mild compression fracture of the right lateral L3 vertebral body best demonstrated on series 6001, image 13. Early right L3 neural foraminal involvement without stenosis. Early ventral epidural tumor involvement at L4. No malignant stenosis at that level, although there is combined mild to moderate right L4 foraminal stenosis which is in part related to degenerative facet hypertrophy. Early ventral epidural involvement at L5.  No stenosis. Diffuse visible sacral and pelvic involvement with probable ventral presacral space extension of tumor. Conus medullaris and cauda equina: Conus extends to the T12-L1 level. No thickening of the cauda equina nerve roots. Paraspinal and other soft tissues: Moderate edema in the medial right psoas muscle from L2-L3 to the L5 level. Patchy edema in the medial erector spinae muscles at the L3 through L5 spinous process levels (series 6001, image 1). Negative visible abdominal viscera. No retroperitoneal lymphadenopathy. Disc levels: No significant disc degeneration. IMPRESSION: 1. 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 Top differential considerations include lymphoma, multiple myeloma, and widespread metastatic disease. 2. Multilevel tumor extension into the epidural space, although generally mild. There is borderline to mild malignant spinal stenosis suspected at both C6 and T9. No spinal cord mass effect or signal abnormality. 3. Multilevel tumor extension into the neural foramina: Left T4, right T9, left T11, right T12, right L3 nerve levels. 4. Pathologic compression fractures of L1 (50%) and the right L3 (mild) vertebral bodies. Minimal to mild loss of midthoracic  vertebral body  height. 5. Reactive medial right psoas muscle and medial lumbar erector spinae muscle edema. 6. Negative partially visible thoracic and abdominal viscera. Electronically Signed   By: Genevie Ann M.D.   On: 01/24/2018 10:32    Assessment and Plan:   1.  Anemia/thrombocytopenia 2.  Hypercalcemia 3.  Diffuse abnormal bone marrow signal, compression fracture of L1 4.  Back pain 5.  Anorexia/weight loss 6.  Renal failure  Paul Green is admitted with back pain and failure to thrive.  He has hypercalcemia and renal failure.  MRIs of the thoracic and lumbar spine revealed diffuse abnormal marrow signal with tumor extension into the epidural space and neural foramina.  There is a compression fracture at L1.  The clinical and laboratory findings are most consistent with a diagnosis of multiple myeloma.  The differential diagnosis includes lymphoma and other malignancies associated with hypercalcemia.  He will complete a laboratory evaluation for myeloma to include a serum immunofixation, serum protein electro pheresis, and serum light chain analysis. I will request a diagnostic bone marrow biopsy for the a.m. on 01/25/2018.  We will initiate systemic therapy if a diagnosis of myeloma is confirmed. The calcium level should improve over the next few days following the dose of pamidronate.  Recommendations: 1.  Continue intravenous hydration, follow calcium level and renal function 2.  Diagnostic bone marrow biopsy 01/25/2018 3.  Initiate systemic therapy on 01/25/2018 if a diagnosis of myeloma is confirmed 4.  Oncology will continue following him in the hospital and we will arrange for an outpatient appointment.  Betsy Coder, MD 01/24/2018, 2:33 PM

## 2018-01-24 NOTE — Progress Notes (Signed)
Chief Complaint: Patient was seen in consultation today for bone marrow biopsy at the request of Dr. Benay Spice  Referring Physician(s): Dr. Benay Spice  Supervising Physician: Arne Cleveland  Patient Status: [facility] - In-pt  History of Present Illness: Paul Green is a 57 y.o. male with pancytopenia and multiple bony lesions suspicious for myeloma. IR is asked to perform bone marrow biopsy. Chart, meds, labs, imaging, allergies reviewed. Feels pretty well aside from some back pain, surrounded by family.  History reviewed. No pertinent past medical history.  Past Surgical History:  Procedure Laterality Date  . VASECTOMY  1985    Allergies: Patient has no known allergies.  Medications:  Current Facility-Administered Medications:  .  0.9 %  sodium chloride infusion, , Intravenous, Continuous, Mariel Aloe, MD, Last Rate: 150 mL/hr at 01/24/18 1300 .  acetaminophen (TYLENOL) tablet 650 mg, 650 mg, Oral, Q6H PRN **OR** acetaminophen (TYLENOL) suppository 650 mg, 650 mg, Rectal, Q6H PRN, Doutova, Anastassia, MD .  bisacodyl (DULCOLAX) suppository 10 mg, 10 mg, Rectal, Daily PRN, Doutova, Anastassia, MD .  calcitonin (MIACALCIN) injection 316 Units, 4 Units/kg, Subcutaneous, BID, Jillyn Ledger, PA-C, 316 Units at 01/24/18 1038 .  feeding supplement (ENSURE ENLIVE) (ENSURE ENLIVE) liquid 237 mL, 237 mL, Oral, BID BM, Doutova, Anastassia, MD, 237 mL at 01/24/18 1040 .  methocarbamol (ROBAXIN) 500 mg in dextrose 5 % 50 mL IVPB, 500 mg, Intravenous, Q8H PRN, Toy Baker, MD, Stopped at 01/24/18 0146 .  morphine 4 MG/ML injection 2-4 mg, 2-4 mg, Intravenous, Q4H PRN, Doutova, Anastassia, MD, 4 mg at 01/24/18 0809 .  ondansetron (ZOFRAN) tablet 4 mg, 4 mg, Oral, Q6H PRN **OR** ondansetron (ZOFRAN) injection 4 mg, 4 mg, Intravenous, Q6H PRN, Doutova, Anastassia, MD .  oxyCODONE (Oxy IR/ROXICODONE) immediate release tablet 5 mg, 5 mg, Oral, Q4H PRN, Toy Baker, MD     Family History  Problem Relation Age of Onset  . Skin cancer Mother   . Atrial fibrillation Father   . Melanoma Father   . Diabetes Sister   . Melanoma Brother   . Stroke Other   . CAD Neg Hx     Social History   Socioeconomic History  . Marital status: Married    Spouse name: Not on file  . Number of children: Not on file  . Years of education: Not on file  . Highest education level: Not on file  Occupational History  . Not on file  Social Needs  . Financial resource strain: Not on file  . Food insecurity:    Worry: Not on file    Inability: Not on file  . Transportation needs:    Medical: Not on file    Non-medical: Not on file  Tobacco Use  . Smoking status: Never Smoker  . Smokeless tobacco: Never Used  Substance and Sexual Activity  . Alcohol use: Yes    Comment: occ  . Drug use: Not on file  . Sexual activity: Not on file  Lifestyle  . Physical activity:    Days per week: Not on file    Minutes per session: Not on file  . Stress: Not on file  Relationships  . Social connections:    Talks on phone: Not on file    Gets together: Not on file    Attends religious service: Not on file    Active member of club or organization: Not on file    Attends meetings of clubs or organizations: Not on file    Relationship  status: Not on file  Other Topics Concern  . Not on file  Social History Narrative  . Not on file     Review of Systems: A 12 point ROS discussed and pertinent positives are indicated in the HPI above.  All other systems are negative.  Review of Systems  Vital Signs: BP 128/74 (BP Location: Left Arm)   Pulse 79   Temp 98.6 F (37 C) (Oral)   Resp 18   Ht _0  (1.676 m)   Wt 177 lb 11.2 oz (80.6 kg)   SpO2 94%   BMI 28.68 kg/m   Physical Exam  Constitutional: He is oriented to person, place, and time. He appears well-developed. No distress.  HENT:  Head: Normocephalic.  Mouth/Throat: Oropharynx is clear and moist.  Neck: Normal  range of motion. No JVD present.  Cardiovascular: Normal rate, regular rhythm and normal heart sounds.  Pulmonary/Chest: Effort normal and breath sounds normal.  Neurological: He is alert and oriented to person, place, and time.  Skin: Skin is warm and dry.  Psychiatric: He has a normal mood and affect.    Imaging: Dg Chest 2 View  Result Date: 01/23/2018 CLINICAL DATA:  Generalized weakness. 30 pound weight loss. Hypercalcemia. EXAM: CHEST - 2 VIEW COMPARISON:  None. FINDINGS: Low lung volumes. Normal heart size and mediastinal contours with mild aortic atherosclerosis. Streaky bibasilar opacities are likely atelectasis. No evidence of focal pulmonary mass. No pulmonary edema, pleural effusion or pneumothorax. No acute osseous abnormalities are seen. IMPRESSION: Low lung volumes with bibasilar atelectasis. Electronically Signed   By: Jeb Levering M.D.   On: 01/23/2018 23:35   Dg Lumbar Spine Complete  Result Date: 01/23/2018 CLINICAL DATA:  30 pound weight loss. Hypercalcemia. Generalized weakness and back pain. EXAM: LUMBAR SPINE - COMPLETE 4+ VIEW COMPARISON:  None. FINDINGS: L1 compression fracture with approximately 40% loss of height anteriorly. No posterior cortex involvement. Remaining lumbar vertebral body heights are maintained. Diffuse endplate spurring with mild diffuse disc space narrowing. The alignment is maintained. Sacroiliac joints are congruent. No radiographic evidence of focal bone lesion. IMPRESSION: 1. L1 compression fracture with 40% loss of height anteriorly, age indeterminate. 2. Mild diffuse spondylosis with endplate spurring and diffuse disc space narrowing. Electronically Signed   By: Jeb Levering M.D.   On: 01/23/2018 23:37   Mr Thoracic Spine Wo Contrast  Result Date: 01/24/2018 CLINICAL DATA:  57 year old male with unexplained 30 lb weight loss, fatigue. Back pain since December, which recently became severe with back spasm. Hypercalcemia. L1 compression  fracture on lumbar radiographs yesterday. IV contrast was deferred at this time due to renal insufficiency (estimated GFR 25). EXAM: MRI THORACIC AND LUMBAR SPINE WITHOUT CONTRAST TECHNIQUE: Multiplanar and multiecho pulse sequences of the thoracic and lumbar spine were obtained without intravenous contrast. COMPARISON:  Chest and lumbar radiographs 01/23/2018. FINDINGS: MRI THORACIC SPINE FINDINGS Limited cervical spine imaging: Diffusely abnormal bone marrow signal throughout the cervical spine, vertebral and posterior element involvement. Possible C6 ventral epidural extension of the abnormality with mild C6-C7 spinal stenosis suspected (series 19001, image 6). Similar abnormal marrow signal at the skull base. Abnormal marrow signal in the visible sternum and manubrium. Thoracic spine segmentation:  Normal. Alignment: Preserved thoracic vertebral height and kyphosis. Borderline to mild loss of mid and lower thoracic vertebral height such as T7 and T8. Vertebrae: Diffusely abnormal marrow signal throughout the thoracic vertebrae including vertebral body, pedicle, posterior element involvement, and bilateral rib involvement. Abnormally expanded left T4 pedicle with early  extension of the abnormality and to the left ventral epidural space and the left T4 neural foramen (series 8001, image 13). More moderate ventral epidural extension of tumor at the T8 and T9 levels (images 25 and 28). The appearance is more pronounced at T9 in greater on the right. But there is no significant spinal stenosis. However, there is moderate involvement of the right T9 neural foramen which appears moderately to severely narrow. Mild-to-moderate ventral epidural extension at T11. Moderate left T11 neural foraminal involvement and mild to moderate foraminal stenosis. Similar epidural and right foraminal involvement at T12, with moderate to severe right T12 foraminal stenosis. Cord: Thoracic spinal cord signal and morphology remains normal  at all levels. There is borderline to Mild spinal stenosis at T9 (series 23001, image 28). The conus medullaris is normal at T12-L1. Paraspinal and other soft tissues: Intermittent wrap artifact on axial images. Negative visible thoracic and upper abdominal viscera. The posterior paraspinal soft tissues remain within normal limits. Disc levels: No significant disc degeneration. MRI LUMBAR SPINE FINDINGS Segmentation: Normal, concordant with the thoracic spine numbering today. Alignment:  Stable alignment.  No spondylolisthesis. Vertebrae: Diffusely abnormal marrow signal throughout the visible lumbosacral spine and pelvis in keeping with the cervical and thoracic findings. Moderate compression fracture of L1 with central loss of vertebral body height up to 52%. Early ventral epidural extension of tumor on the left side (series 4001, image 9). Early left foraminal involvement without definite foraminal stenosis. Similar early ventral epidural and foraminal involvement on the left at L2. No stenosis. Mild compression fracture of the right lateral L3 vertebral body best demonstrated on series 6001, image 13. Early right L3 neural foraminal involvement without stenosis. Early ventral epidural tumor involvement at L4. No malignant stenosis at that level, although there is combined mild to moderate right L4 foraminal stenosis which is in part related to degenerative facet hypertrophy. Early ventral epidural involvement at L5.  No stenosis. Diffuse visible sacral and pelvic involvement with probable ventral presacral space extension of tumor. Conus medullaris and cauda equina: Conus extends to the T12-L1 level. No thickening of the cauda equina nerve roots. Paraspinal and other soft tissues: Moderate edema in the medial right psoas muscle from L2-L3 to the L5 level. Patchy edema in the medial erector spinae muscles at the L3 through L5 spinous process levels (series 6001, image 1). Negative visible abdominal viscera. No  retroperitoneal lymphadenopathy. Disc levels: No significant disc degeneration. IMPRESSION: 1. 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 Top differential considerations include lymphoma, multiple myeloma, and widespread metastatic disease. 2. Multilevel tumor extension into the epidural space, although generally mild. There is borderline to mild malignant spinal stenosis suspected at both C6 and T9. No spinal cord mass effect or signal abnormality. 3. Multilevel tumor extension into the neural foramina: Left T4, right T9, left T11, right T12, right L3 nerve levels. 4. Pathologic compression fractures of L1 (50%) and the right L3 (mild) vertebral bodies. Minimal to mild loss of midthoracic vertebral body height. 5. Reactive medial right psoas muscle and medial lumbar erector spinae muscle edema. 6. Negative partially visible thoracic and abdominal viscera. Electronically Signed   By: Genevie Ann M.D.   On: 01/24/2018 10:32   Mr Lumbar Spine Wo Contrast  Result Date: 01/24/2018 CLINICAL DATA:  57 year old male with unexplained 30 lb weight loss, fatigue. Back pain since December, which recently became severe with back spasm. Hypercalcemia.  L1 compression fracture on lumbar radiographs yesterday. IV contrast was deferred at this time due to renal insufficiency (estimated GFR 25). EXAM: MRI THORACIC AND LUMBAR SPINE WITHOUT CONTRAST TECHNIQUE: Multiplanar and multiecho pulse sequences of the thoracic and lumbar spine were obtained without intravenous contrast. COMPARISON:  Chest and lumbar radiographs 01/23/2018. FINDINGS: MRI THORACIC SPINE FINDINGS Limited cervical spine imaging: Diffusely abnormal bone marrow signal throughout the cervical spine, vertebral and posterior element involvement. Possible C6 ventral epidural extension of the abnormality with mild C6-C7 spinal stenosis suspected  (series 19001, image 6). Similar abnormal marrow signal at the skull base. Abnormal marrow signal in the visible sternum and manubrium. Thoracic spine segmentation:  Normal. Alignment: Preserved thoracic vertebral height and kyphosis. Borderline to mild loss of mid and lower thoracic vertebral height such as T7 and T8. Vertebrae: Diffusely abnormal marrow signal throughout the thoracic vertebrae including vertebral body, pedicle, posterior element involvement, and bilateral rib involvement. Abnormally expanded left T4 pedicle with early extension of the abnormality and to the left ventral epidural space and the left T4 neural foramen (series 8001, image 13). More moderate ventral epidural extension of tumor at the T8 and T9 levels (images 25 and 28). The appearance is more pronounced at T9 in greater on the right. But there is no significant spinal stenosis. However, there is moderate involvement of the right T9 neural foramen which appears moderately to severely narrow. Mild-to-moderate ventral epidural extension at T11. Moderate left T11 neural foraminal involvement and mild to moderate foraminal stenosis. Similar epidural and right foraminal involvement at T12, with moderate to severe right T12 foraminal stenosis. Cord: Thoracic spinal cord signal and morphology remains normal at all levels. There is borderline to Mild spinal stenosis at T9 (series 23001, image 28). The conus medullaris is normal at T12-L1. Paraspinal and other soft tissues: Intermittent wrap artifact on axial images. Negative visible thoracic and upper abdominal viscera. The posterior paraspinal soft tissues remain within normal limits. Disc levels: No significant disc degeneration. MRI LUMBAR SPINE FINDINGS Segmentation: Normal, concordant with the thoracic spine numbering today. Alignment:  Stable alignment.  No spondylolisthesis. Vertebrae: Diffusely abnormal marrow signal throughout the visible lumbosacral spine and pelvis in keeping with  the cervical and thoracic findings. Moderate compression fracture of L1 with central loss of vertebral body height up to 52%. Early ventral epidural extension of tumor on the left side (series 4001, image 9). Early left foraminal involvement without definite foraminal stenosis. Similar early ventral epidural and foraminal involvement on the left at L2. No stenosis. Mild compression fracture of the right lateral L3 vertebral body best demonstrated on series 6001, image 13. Early right L3 neural foraminal involvement without stenosis. Early ventral epidural tumor involvement at L4. No malignant stenosis at that level, although there is combined mild to moderate right L4 foraminal stenosis which is in part related to degenerative facet hypertrophy. Early ventral epidural involvement at L5.  No stenosis. Diffuse visible sacral and pelvic involvement with probable ventral presacral space extension of tumor. Conus medullaris and cauda equina: Conus extends to the T12-L1 level. No thickening of the cauda equina nerve roots. Paraspinal and other soft tissues: Moderate edema in the medial right psoas muscle from L2-L3 to the L5 level. Patchy edema in the medial erector spinae muscles at the L3 through L5 spinous process levels (series 6001, image 1). Negative visible abdominal viscera. No retroperitoneal lymphadenopathy. Disc levels: No significant disc degeneration. IMPRESSION: 1. 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 Top differential considerations include lymphoma, multiple myeloma, and widespread metastatic disease. 2. Multilevel tumor extension into the epidural space, although generally mild. There is borderline to mild malignant spinal stenosis suspected at both C6 and T9. No spinal cord mass effect or signal abnormality. 3. Multilevel tumor extension into the neural foramina: Left T4, right  T9, left T11, right T12, right L3 nerve levels. 4. Pathologic compression fractures of L1 (50%) and the right L3 (mild) vertebral bodies. Minimal to mild loss of midthoracic vertebral body height. 5. Reactive medial right psoas muscle and medial lumbar erector spinae muscle edema. 6. Negative partially visible thoracic and abdominal viscera. Electronically Signed   By: Genevie Ann M.D.   On: 01/24/2018 10:32    Labs:  CBC: Recent Labs    01/23/18 1843 01/24/18 0109 01/24/18 0327  WBC 4.8 3.4* 3.5*  HGB 11.0* 9.9* 9.1*  HCT 32.2* 28.9* 26.9*  PLT 144* 110* 117*    COAGS: No results for input(s): INR, APTT in the last 8760 hours.  BMP: Recent Labs    01/24/18 0109 01/24/18 0327 01/24/18 0753 01/24/18 1351  NA 138 137 138 139  K 3.1* 3.3* 3.6 3.2*  CL 99* 100* 100* 103  CO2 _0 GLUCOSE 108* 111* 129* 110*  BUN 53* 53* 53* 53*  CALCIUM 14.2* 13.3* 13.3* 12.8*  CREATININE 2.60* 2.67* 2.74* 2.79*  GFRNONAA 26* 25* 24* 24*  GFRAA 30* 29* 28* 27*    LIVER FUNCTION TESTS: Recent Labs    01/23/18 1843 01/24/18 0327  BILITOT 0.8 0.7  AST 46* 36  ALT 32 26  ALKPHOS 91 71  PROT 6.8 5.6*  ALBUMIN 4.1 3.2*    TUMOR MARKERS: No results for input(s): AFPTM, CEA, CA199, CHROMGRNA in the last 8760 hours.  Assessment and Plan: Pancytopenia Bony lesions For CT guided bone marrow biopsy tomorrow. Labs ok Risks and benefits discussed with the patient including, but not limited to bleeding, infection, damage to adjacent structures or low yield requiring additional tests.  All of the patient's questions were answered, patient is agreeable to proceed. Consent signed and in chart.    Thank you for this interesting consult.  I greatly enjoyed meeting Frankey Malerba and look forward to participating in their care.  A copy of this report was sent to the requesting provider on this date.  Electronically Signed: Ascencion Dike, PA-C 01/24/2018, 4:10 PM   I spent a total  of 20 minutes in face to face in clinical consultation, greater than 50% of which was counseling/coordinating care for bone marrow biopsy

## 2018-01-24 NOTE — Evaluation (Signed)
Occupational Therapy Evaluation Patient Details Name: Paul Green MRN: [id number] DOB: 08/31/1961 Today's Date: 01/24/2018    History of Present Illness Paul Green is a 57 y.o. male with no medical history. 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 Pt has a reacher at home and uses a "walking stick" provide by outpatient PT. Pt with stiffness in back upon initially getting to EOB and standing and demonstrates slow, guarded movement during ADL mobility    Follow Up Recommendations    none   Equipment Recommendations    reacher   Recommendations for Other Services       Precautions / Restrictions Precautions Precautions: Back Restrictions Weight Bearing Restrictions: No      Mobility Bed Mobility Overal bed mobility: Modified Independent                Transfers Overall transfer level: Needs assistance Equipment used: None(walking stick) Transfers: Sit to/from Stand;Stand Pivot Transfers Sit to Stand: Supervision Stand pivot transfers: Supervision       General transfer comment: sup for safety, pt with  back stiffness when intially standing. slow guarded movement during ADL mobility    Balance Overall balance assessment: No apparent balance deficits (not formally assessed)                                         ADL either performed or assessed with clinical judgement   ADL Overall ADL's : At baseline;Modified independent                                       General ADL Comments: pt able to perform LB ADLs using compensatory techniques, sup for safety with toilet and shower transfers, slow guarded movement during ADL mobility     Vision Baseline Vision/History: Wears glasses Wears Glasses: Reading only Patient Visual Report: No change from baseline        Perception     Praxis      Pertinent Vitals/Pain Pain Assessment: 0-10 Pain Score: 4  Pain Descriptors / Indicators: Aching;Tightness Pain Intervention(s): Limited activity within patient's tolerance;Monitored during session;Repositioned     Hand Dominance Right   Extremity/Trunk Assessment Upper Extremity Assessment Upper Extremity Assessment: Overall WFL for tasks assessed   Lower Extremity Assessment Lower Extremity Assessment: Defer to PT evaluation       Communication Communication Communication: No difficulties   Cognition Arousal/Alertness: Awake/alert Behavior During Therapy: WFL for tasks assessed/performed Overall Cognitive Status: Within Functional Limits for tasks assessed                                     General Comments       Exercises     Shoulder Instructions      Home Living Family/patient expects to be discharged to:: Private residence Living Arrangements: Spouse/significant other Available Help at Discharge: Available 24 hours/day Type of Home: House Home Access: Stairs to enter [employer] of Steps: 2 Entrance Stairs-Rails: Right Home Layout: 1/2 bath on main level;Two level Alternate Level Stairs-Number of Steps: flight  Alternate Level Stairs-Rails: Right Bathroom Shower/Tub:  Walk-in shower   Bathroom Toilet: Handicapped height     Home Equipment: Hospital bed;Shower seat   Additional Comments: lift chair      Prior Functioning/Environment                   OT Problem List: Pain      OT Treatment/Interventions:      OT Goals(Current goals can be found in the care plan section) Acute Rehab OT Goals Patient Stated Goal: go home OT Goal Formulation: With patient/family  OT Frequency:     Barriers to D/C:    no barriers       Co-evaluation              AM-PAC PT "6 Clicks" Daily Activity     Outcome Measure Help from another person eating meals?: None Help from another person  taking care of personal grooming?: None Help from another person toileting, which includes using toliet, bedpan, or urinal?: None Help from another person bathing (including washing, rinsing, drying)?: None   Help from another person to put on and taking off regular lower body clothing?: None 6 Click Score: 20   End of Session Equipment Utilized During Treatment: Other (comment)(walking stick)  Activity Tolerance: Patient tolerated treatment well Patient left: in bed;with call bell/phone within reach;with family/visitor present  OT Visit Diagnosis: Pain;Other abnormalities of gait and mobility (R26.89) Pain - Right/Left: (bilaterally) Pain - part of body: (back)                Time: 1254-1320 OT Time Calculation (min): 26 min Charges:  OT General Charges $OT Visit: 1 Visit OT Evaluation $OT Eval Low Complexity: 1 Low G-Codes: OT G-codes **NOT FOR INPATIENT CLASS** Functional Assessment Tool Used: AM-PAC 6 Clicks Daily Activity     Britt Bottom 01/24/2018, 2:15 PM

## 2018-01-24 NOTE — Evaluation (Signed)
Physical Therapy Evaluation Patient Details Name: Paul Green MRN: [id number] DOB: 1960/11/12 Today's Date: 01/24/2018   History of Present Illness  Paul Green is a 57 y.o. male with no significant medical history. He presented with back pain and weakness and found to have metastatic disease,  Multiple lesions down thoracic and lumbar spine  and associated pathologic fractures at L1, L3--concern for multiple myeloma; Oncology consult pending;  Clinical Impression  Pt admitted with above diagnosis. Pt currently with functional limitations due to the deficits listed below (see PT Problem List).  Pt has made numerous changes at home to accommodate his decreasing level of mobility and incr back pain, he has been unable to get OOB and just recently purchased an adjustable base/mattress; He is still fairly active and has been going to OPPT for approximately 5 weeks prior to this admission; Pt able to amb 110'  today with the walking stick he got at OPPT; encouraged pt to continue to amb to bathroom with nursing staff; pt denies any falls at home; will continue to follow, Oncology pending so course of treatment may change his discharge needs;  At this time he is appropriate to return to OPPT if and when he is cleared to do so per MD;  Pt will benefit from skilled PT to increase their independence and safety with mobility to allow discharge to the venue listed below.       Follow Up Recommendations Outpatient PT;Supervision - Intermittent    Equipment Recommendations  None recommended by PT    Recommendations for Other Services       Precautions / Restrictions Precautions Precautions: Back Precaution Comments: TLSO was ordered, pt declined-- stating that it would cause pain exacerbation Restrictions Weight Bearing Restrictions: No      Mobility  Bed Mobility Overal bed mobility: Modified Independent             General bed mobility comments: pt raises HOB, has adjustable bed at  home  Transfers Overall transfer level: Needs assistance Equipment used: Ambulation equipment used(walking stick) Transfers: Sit to/from Stand Sit to Stand: Supervision Stand pivot transfers: Supervision       General transfer comment: sup for safety, pt with  back stiffness when intially standing. slow guarded movement   Ambulation/Gait Ambulation/Gait assistance: Min guard;Supervision Ambulation Distance (Feet): 110 Feet Assistive device: (walking stick) Gait Pattern/deviations: Step-through pattern;Decreased stride length;Decreased dorsiflexion - right;Decreased dorsiflexion - left;Wide base of support     General Gait Details: gait is slow and guarded, diminished heel strike and push off; min/guard for safety, no LOB although mildly unsteady  Stairs            Wheelchair Mobility    Modified Rankin (Stroke Patients Only)       Balance Overall balance assessment: No apparent balance deficits (not formally assessed)   Sitting balance-Leahy Scale: Fair Sitting balance - Comments: not tested beyond d/t pain     Standing balance-Leahy Scale: Fair Standing balance comment: nto tested futher d/t issues with pain                             Pertinent Vitals/Pain Pain Assessment: 0-10 Pain Score: 4  Pain Location: back Pain Descriptors / Indicators: Aching;Tightness Pain Intervention(s): Limited activity within patient's tolerance;Monitored during session    [place] expects to be discharged to:: Private residence Living Arrangements: Spouse/significant other Available Help at Discharge: Available 24 hours/day Type of Home: House Home Access: Stairs  to enter Entrance Stairs-Rails: Right Entrance Stairs-Number of Steps: 2 Home Layout: 1/2 bath on main level;Two level Home Equipment: Hospital bed;Shower seat Additional Comments: "motorized recliner"    Prior Function Level of Independence: Independent with assistive device(s)          Comments: pt was going to OPPT for the last 5weeks     Hand Dominance   Dominant Hand: Right    Extremity/Trunk Assessment   Upper Extremity Assessment Upper Extremity Assessment: Overall WFL for tasks assessed;Defer to OT evaluation    Lower Extremity Assessment Lower Extremity Assessment: RLE deficits/detail RLE Deficits / Details: grossly 4/5 bil RLE: (pt denies changes)    Cervical / Trunk Assessment Cervical / Trunk Assessment: Other exceptions Cervical / Trunk Exceptions: decr lumbar lordosis  Communication   Communication: No difficulties  Cognition Arousal/Alertness: Awake/alert Behavior During Therapy: WFL for tasks assessed/performed Overall Cognitive Status: Within Functional Limits for tasks assessed                                        General Comments      Exercises     Assessment/Plan    PT Assessment Patient needs continued PT services  PT Problem List Decreased activity tolerance;Decreased balance;Decreased mobility;Pain       PT Treatment Interventions Therapeutic activities;Gait training;Stair training;Functional mobility training;Therapeutic exercise;Patient/family education    PT Goals (Current goals can be found in the Care Plan section)  Acute Rehab PT Goals Patient Stated Goal: go home PT Goal Formulation: With patient Time For Goal Achievement: 02/07/18 Potential to Achieve Goals: Good    Frequency Min 2X/week   Barriers to discharge        Co-evaluation PT/OT/SLP Co-Evaluation/Treatment: Yes Reason for Co-Treatment: To address functional/ADL transfers PT goals addressed during session: Mobility/safety with mobility         AM-PAC PT "6 Clicks" Daily Activity  Outcome Measure Difficulty turning over in bed (including adjusting bedclothes, sheets and blankets)?: A Lot Difficulty moving from lying on back to sitting on the side of the bed? : A Lot Difficulty sitting down on and standing up  from a chair with arms (e.g., wheelchair, bedside commode, etc,.)?: A Lot Help needed moving to and from a bed to chair (including a wheelchair)?: A Little Help needed walking in hospital room?: A Little Help needed climbing 3-5 steps with a railing? : A Lot 6 Click Score: 14    End of Session   Activity Tolerance: Patient tolerated treatment well Patient left: in bed;with call bell/phone within reach;with family/visitor present   PT Visit Diagnosis: Other abnormalities of gait and mobility (R26.89)    Time: 5732-2025 PT Time Calculation (min) (ACUTE ONLY): 25 min   Charges:   PT Evaluation $PT Eval Low Complexity: 1 Low     PT G CodesKenyon Ana, PT Pager: [phone] 01/24/2018   [facility] 01/24/2018, 3:48 PM

## 2018-01-24 NOTE — Progress Notes (Signed)
Orthopedic Tech Progress Note Patient Details:  Paul Green Dec 27, 1960 [id number]  Patient ID: Paul Green, male   DOB: 05-24-1961, 57 y.o.   MRN: [id number]   [name] 01/24/2018, 9:30 AM RN reported that pt refused the back brace that was ordered for him.

## 2018-01-24 NOTE — H&P (Deleted)
PROGRESS NOTE    Paul Green  PIR:[id number] DOB: 1961-01-15 DOA: 01/23/2018 PCP: [name], MD   Brief Narrative: Paul Green is a 57 y.o. male with no medical history. He presented with back pain and weakness and found to have metastatic disease and associated pathologic fracture. Oncology consulted.   Assessment & Plan:   Active Problems:   Hypercalcemia   AKI (acute kidney injury) ([place])   Back pain   Closed compression fracture of L1 lumbar vertebra (HCC)   Hypercalcemia Improved slightly with IV fluids -Continue IV fluids -Oncology recommendations  Metastatic cancer Unknown source. Concern for multiple myeloma. Multiple lesions down thoracic and lumbar spine. No spinal cord impingement. -Oncology consulted and will see today -SPEP/UPEP pending; other labs per oncology  Compression fracture Pathologic secondary to metastatic disease. L1, L3 lumbar vertebra. -analgesics  Acute kidney injury Unknown baseline. Possible this could be chronic. -IV fluids  Back pain Related to above. -analgesics   DVT prophylaxis: Lovenox Code Status:   Code Status: Full Code Family Communication: Wife at bedside, daughter came in near end of evaluation Disposition Plan: Pending oncology workup/improvement of hypercalcemia   Consultants:   Oncology  Procedures:   None  Antimicrobials:  None    Subjective: Feels good today.  Objective: Vitals:   01/23/18 2345 01/24/18 0048 01/24/18 0601 01/24/18 0757  BP: 118/82 116/77 117/71 119/69  Pulse: 86 93 75 85  Resp: (!) '24 16 18 18  ' Temp:  98.1 F (36.7 C) 97.8 F (36.6 C) 97.8 F (36.6 C)  TempSrc:  Oral Oral Oral  SpO2: 95% 95% 95% 96%  Weight:  80.6 kg (177 lb 11.2 oz)    Height:  '5\' 6"'  (1.676 m)      Intake/Output Summary (Last 24 hours) at 01/24/2018 1058 Last data filed at 01/24/2018 1025 Gross per 24 hour  Intake 3340 ml  Output 950 ml  Net 2390 ml   Filed Weights   01/23/18 2232 01/24/18  0048  Weight: 78.9 kg (174 lb) 80.6 kg (177 lb 11.2 oz)    Examination:  General exam: Appears calm and comfortable Respiratory system: Clear to auscultation. Respiratory effort normal. Cardiovascular system: S1 & S2 heard, RRR. No murmurs, rubs, gallops or clicks. Gastrointestinal system: Abdomen is nondistended, soft and nontender. No organomegaly or masses felt. Normal bowel sounds heard. Central nervous system: Alert and oriented. No focal neurological deficits. Extremities: No edema. No calf tenderness Skin: No cyanosis. No rashes Psychiatry: Judgement and insight appear normal. Mood & affect appropriate.     Data Reviewed: I have personally reviewed following labs and imaging studies  CBC: Recent Labs  Lab 01/23/18 1843 01/24/18 0109 01/24/18 0327  WBC 4.8 3.4* 3.5*  NEUTROABS  --  2.2  --   HGB 11.0* 9.9* 9.1*  HCT 32.2* 28.9* 26.9*  MCV 95.0 94.1 94.1  PLT 144* 110* 630*   Basic Metabolic Panel: Recent Labs  Lab 01/23/18 1843 01/24/18 0109 01/24/18 0327 01/24/18 0753  NA 137 138 137 138  K 3.8 3.1* 3.3* 3.6  CL 94* 99* 100* 100*  CO2 '30 29 28 26  ' GLUCOSE 101* 108* 111* 129*  BUN 51* 53* 53* 53*  CREATININE 2.62* 2.60* 2.67* 2.74*  CALCIUM >13.0* 14.2* 13.3* 13.3*  MG 2.2  --  1.8  --   PHOS  --  2.9 3.5  --    GFR: Estimated Creatinine Clearance: 29.7 mL/min (A) (by C-G formula based on SCr of 2.74 mg/dL (H)). Liver Function Tests: Recent  Labs  Lab 01/23/18 1843 01/24/18 0327  AST 46* 36  ALT 32 26  ALKPHOS 91 71  BILITOT 0.8 0.7  PROT 6.8 5.6*  ALBUMIN 4.1 3.2*   Recent Labs  Lab 01/23/18 1843  LIPASE 31   No results for input(s): AMMONIA in the last 168 hours. Coagulation Profile: No results for input(s): INR, PROTIME in the last 168 hours. Cardiac Enzymes: Recent Labs  Lab 01/24/18 0109  CKTOTAL 134   BNP (last 3 results) No results for input(s): PROBNP in the last 8760 hours. HbA1C: No results for input(s): HGBA1C in the  last 72 hours. CBG: Recent Labs  Lab 01/23/18 1856  GLUCAP 91   Lipid Profile: No results for input(s): CHOL, HDL, LDLCALC, TRIG, CHOLHDL, LDLDIRECT in the last 72 hours. Thyroid Function Tests: Recent Labs    01/24/18 0109  TSH 1.019   Anemia Panel: Recent Labs    01/24/18 0327  VITAMINB12 186  FOLATE 9.3  FERRITIN 1,214*  TIBC 218*  IRON 70  RETICCTPCT 1.9   Sepsis Labs: No results for input(s): PROCALCITON, LATICACIDVEN in the last 168 hours.  No results found for this or any previous visit (from the past 240 hour(s)).       Radiology Studies: Dg Chest 2 View  Result Date: 01/23/2018 CLINICAL DATA:  Generalized weakness. 30 pound weight loss. Hypercalcemia. EXAM: CHEST - 2 VIEW COMPARISON:  None. FINDINGS: Low lung volumes. Normal heart size and mediastinal contours with mild aortic atherosclerosis. Streaky bibasilar opacities are likely atelectasis. No evidence of focal pulmonary mass. No pulmonary edema, pleural effusion or pneumothorax. No acute osseous abnormalities are seen. IMPRESSION: Low lung volumes with bibasilar atelectasis. Electronically Signed   By: Jeb Levering M.D.   On: 01/23/2018 23:35   Dg Lumbar Spine Complete  Result Date: 01/23/2018 CLINICAL DATA:  30 pound weight loss. Hypercalcemia. Generalized weakness and back pain. EXAM: LUMBAR SPINE - COMPLETE 4+ VIEW COMPARISON:  None. FINDINGS: L1 compression fracture with approximately 40% loss of height anteriorly. No posterior cortex involvement. Remaining lumbar vertebral body heights are maintained. Diffuse endplate spurring with mild diffuse disc space narrowing. The alignment is maintained. Sacroiliac joints are congruent. No radiographic evidence of focal bone lesion. IMPRESSION: 1. L1 compression fracture with 40% loss of height anteriorly, age indeterminate. 2. Mild diffuse spondylosis with endplate spurring and diffuse disc space narrowing. Electronically Signed   By: Jeb Levering M.D.    On: 01/23/2018 23:37   Mr Thoracic Spine Wo Contrast  Result Date: 01/24/2018 CLINICAL DATA:  57 year old male with unexplained 30 lb weight loss, fatigue. Back pain since December, which recently became severe with back spasm. Hypercalcemia. L1 compression fracture on lumbar radiographs yesterday. IV contrast was deferred at this time due to renal insufficiency (estimated GFR 25). EXAM: MRI THORACIC AND LUMBAR SPINE WITHOUT CONTRAST TECHNIQUE: Multiplanar and multiecho pulse sequences of the thoracic and lumbar spine were obtained without intravenous contrast. COMPARISON:  Chest and lumbar radiographs 01/23/2018. FINDINGS: MRI THORACIC SPINE FINDINGS Limited cervical spine imaging: Diffusely abnormal bone marrow signal throughout the cervical spine, vertebral and posterior element involvement. Possible C6 ventral epidural extension of the abnormality with mild C6-C7 spinal stenosis suspected (series 19001, image 6). Similar abnormal marrow signal at the skull base. Abnormal marrow signal in the visible sternum and manubrium. Thoracic spine segmentation:  Normal. Alignment: Preserved thoracic vertebral height and kyphosis. Borderline to mild loss of mid and lower thoracic vertebral height such as T7 and T8. Vertebrae: Diffusely abnormal marrow signal throughout  the thoracic vertebrae including vertebral body, pedicle, posterior element involvement, and bilateral rib involvement. Abnormally expanded left T4 pedicle with early extension of the abnormality and to the left ventral epidural space and the left T4 neural foramen (series 8001, image 13). More moderate ventral epidural extension of tumor at the T8 and T9 levels (images 25 and 28). The appearance is more pronounced at T9 in greater on the right. But there is no significant spinal stenosis. However, there is moderate involvement of the right T9 neural foramen which appears moderately to severely narrow. Mild-to-moderate ventral epidural extension at T11.  Moderate left T11 neural foraminal involvement and mild to moderate foraminal stenosis. Similar epidural and right foraminal involvement at T12, with moderate to severe right T12 foraminal stenosis. Cord: Thoracic spinal cord signal and morphology remains normal at all levels. There is borderline to Mild spinal stenosis at T9 (series 23001, image 28). The conus medullaris is normal at T12-L1. Paraspinal and other soft tissues: Intermittent wrap artifact on axial images. Negative visible thoracic and upper abdominal viscera. The posterior paraspinal soft tissues remain within normal limits. Disc levels: No significant disc degeneration. MRI LUMBAR SPINE FINDINGS Segmentation: Normal, concordant with the thoracic spine numbering today. Alignment:  Stable alignment.  No spondylolisthesis. Vertebrae: Diffusely abnormal marrow signal throughout the visible lumbosacral spine and pelvis in keeping with the cervical and thoracic findings. Moderate compression fracture of L1 with central loss of vertebral body height up to 52%. Early ventral epidural extension of tumor on the left side (series 4001, image 9). Early left foraminal involvement without definite foraminal stenosis. Similar early ventral epidural and foraminal involvement on the left at L2. No stenosis. Mild compression fracture of the right lateral L3 vertebral body best demonstrated on series 6001, image 13. Early right L3 neural foraminal involvement without stenosis. Early ventral epidural tumor involvement at L4. No malignant stenosis at that level, although there is combined mild to moderate right L4 foraminal stenosis which is in part related to degenerative facet hypertrophy. Early ventral epidural involvement at L5.  No stenosis. Diffuse visible sacral and pelvic involvement with probable ventral presacral space extension of tumor. Conus medullaris and cauda equina: Conus extends to the T12-L1 level. No thickening of the cauda equina nerve roots.  Paraspinal and other soft tissues: Moderate edema in the medial right psoas muscle from L2-L3 to the L5 level. Patchy edema in the medial erector spinae muscles at the L3 through L5 spinous process levels (series 6001, image 1). Negative visible abdominal viscera. No retroperitoneal lymphadenopathy. Disc levels: No significant disc degeneration. IMPRESSION: 1. 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 Top differential considerations include lymphoma, multiple myeloma, and widespread metastatic disease. 2. Multilevel tumor extension into the epidural space, although generally mild. There is borderline to mild malignant spinal stenosis suspected at both C6 and T9. No spinal cord mass effect or signal abnormality. 3. Multilevel tumor extension into the neural foramina: Left T4, right T9, left T11, right T12, right L3 nerve levels. 4. Pathologic compression fractures of L1 (50%) and the right L3 (mild) vertebral bodies. Minimal to mild loss of midthoracic vertebral body height. 5. Reactive medial right psoas muscle and medial lumbar erector spinae muscle edema. 6. Negative partially visible thoracic and abdominal viscera. Electronically Signed   By: Genevie Ann M.D.   On: 01/24/2018 10:32   Mr Lumbar Spine Wo Contrast  Result Date: 01/24/2018 CLINICAL DATA:  57 year old male with unexplained 30 lb weight loss, fatigue. Back pain since December, which recently became severe with back spasm. Hypercalcemia. L1 compression fracture on lumbar radiographs yesterday. IV contrast was deferred at this time due to renal insufficiency (estimated GFR 25). EXAM: MRI THORACIC AND LUMBAR SPINE WITHOUT CONTRAST TECHNIQUE: Multiplanar and multiecho pulse sequences of the thoracic and lumbar spine were obtained without intravenous contrast. COMPARISON:  Chest and lumbar radiographs 01/23/2018. FINDINGS: MRI THORACIC  SPINE FINDINGS Limited cervical spine imaging: Diffusely abnormal bone marrow signal throughout the cervical spine, vertebral and posterior element involvement. Possible C6 ventral epidural extension of the abnormality with mild C6-C7 spinal stenosis suspected (series 19001, image 6). Similar abnormal marrow signal at the skull base. Abnormal marrow signal in the visible sternum and manubrium. Thoracic spine segmentation:  Normal. Alignment: Preserved thoracic vertebral height and kyphosis. Borderline to mild loss of mid and lower thoracic vertebral height such as T7 and T8. Vertebrae: Diffusely abnormal marrow signal throughout the thoracic vertebrae including vertebral body, pedicle, posterior element involvement, and bilateral rib involvement. Abnormally expanded left T4 pedicle with early extension of the abnormality and to the left ventral epidural space and the left T4 neural foramen (series 8001, image 13). More moderate ventral epidural extension of tumor at the T8 and T9 levels (images 25 and 28). The appearance is more pronounced at T9 in greater on the right. But there is no significant spinal stenosis. However, there is moderate involvement of the right T9 neural foramen which appears moderately to severely narrow. Mild-to-moderate ventral epidural extension at T11. Moderate left T11 neural foraminal involvement and mild to moderate foraminal stenosis. Similar epidural and right foraminal involvement at T12, with moderate to severe right T12 foraminal stenosis. Cord: Thoracic spinal cord signal and morphology remains normal at all levels. There is borderline to Mild spinal stenosis at T9 (series 23001, image 28). The conus medullaris is normal at T12-L1. Paraspinal and other soft tissues: Intermittent wrap artifact on axial images. Negative visible thoracic and upper abdominal viscera. The posterior paraspinal soft tissues remain within normal limits. Disc levels: No significant disc degeneration. MRI  LUMBAR SPINE FINDINGS Segmentation: Normal, concordant with the thoracic spine numbering today. Alignment:  Stable alignment.  No spondylolisthesis. Vertebrae: Diffusely abnormal marrow signal throughout the visible lumbosacral spine and pelvis in keeping with the cervical and thoracic findings. Moderate compression fracture of L1 with central loss of vertebral body height up to 52%. Early ventral epidural extension of tumor on the left side (series 4001, image 9). Early left foraminal involvement without definite foraminal stenosis. Similar early ventral epidural and foraminal involvement on the left at L2. No stenosis. Mild compression fracture of the right lateral L3 vertebral body best demonstrated on series 6001, image 13. Early right L3 neural foraminal involvement without stenosis. Early ventral epidural tumor involvement at L4. No malignant stenosis at that level, although there is combined mild to moderate right L4 foraminal stenosis which is in part related to degenerative facet hypertrophy. Early ventral epidural involvement at L5.  No stenosis. Diffuse visible sacral and pelvic involvement with probable ventral presacral space extension of tumor. Conus medullaris and cauda equina: Conus extends to the T12-L1 level. No thickening of the cauda equina nerve roots. Paraspinal and other soft tissues: Moderate edema in the medial right psoas muscle from L2-L3 to the L5 level. Patchy edema in the medial erector spinae muscles at the L3 through L5 spinous process levels (series 6001, image 1). Negative visible abdominal viscera. No retroperitoneal lymphadenopathy. Disc  levels: No significant disc degeneration. IMPRESSION: 1. 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 Top differential considerations include lymphoma, multiple myeloma, and widespread metastatic disease. 2. Multilevel tumor  extension into the epidural space, although generally mild. There is borderline to mild malignant spinal stenosis suspected at both C6 and T9. No spinal cord mass effect or signal abnormality. 3. Multilevel tumor extension into the neural foramina: Left T4, right T9, left T11, right T12, right L3 nerve levels. 4. Pathologic compression fractures of L1 (50%) and the right L3 (mild) vertebral bodies. Minimal to mild loss of midthoracic vertebral body height. 5. Reactive medial right psoas muscle and medial lumbar erector spinae muscle edema. 6. Negative partially visible thoracic and abdominal viscera. Electronically Signed   By: Genevie Ann M.D.   On: 01/24/2018 10:32        Scheduled Meds: . calcitonin  4 Units/kg Subcutaneous BID  . feeding supplement (ENSURE ENLIVE)  237 mL Oral BID BM   Continuous Infusions: . methocarbamol (ROBAXIN)  IV Stopped (01/24/18 0146)     LOS: 1 day     Cordelia Poche, MD [facility] 01/24/2018, 10:58 AM Pager: [phone]  If 7PM-7AM, please contact night-coverage [email or web address] Password [facility] 01/24/2018, 10:58 AM

## 2018-01-24 NOTE — Progress Notes (Signed)
Pt refused to have TLSO brace applied. Pt stated " It only makes me hard to move and not comfortable wearing it". Pt educated but still refused. Will continue to monitor pt.

## 2018-01-24 NOTE — Progress Notes (Signed)
CRITICAL VALUE ALERT  Critical Value:  Calcium 14.2  Date & Time Notied:  01/24/18 0234  Provider Notified: Lamar Blinks  Orders Received/Actions taken: no new orders

## 2018-01-24 NOTE — Progress Notes (Signed)
   01/24/18 1428  Clinical Encounter Type  Visited With Patient and family together  Visit Type Spiritual support;Initial  Referral From Nurse;Patient  Consult/Referral To Chaplain  Spiritual Encounters  Spiritual Needs Emotional  Stress Factors  Patient Stress Factors Health changes  Family Stress Factors Health changes   Responded to a SCC to support the family as the patient had received news today of possible cancer of the spine.  Patient was in the bed sitting up with his 2 daughters, son-in-law and wife present.  He shared about what had gone on with his back and what brought him to the hospital.  He shared that he found out today and that the Oncologist will meet with them this afternoon.  Patient is anxious to get a game plan for treatment.  Spent some time getting to know the family and what brought them to [place].  Prayed with the family.  They are all in shock and disbelief.  I assess they will need follow up support.  Will follow and support as needed. Chaplain Katherene Ponto

## 2018-01-25 ENCOUNTER — Other Ambulatory Visit: Payer: Self-pay | Admitting: *Deleted

## 2018-01-25 ENCOUNTER — Inpatient Hospital Stay (HOSPITAL_COMMUNITY): Payer: BLUE CROSS/BLUE SHIELD

## 2018-01-25 DIAGNOSIS — D4989 Neoplasm of unspecified behavior of other specified sites: Secondary | ICD-10-CM | POA: Diagnosis not present

## 2018-01-25 DIAGNOSIS — D696 Thrombocytopenia, unspecified: Secondary | ICD-10-CM | POA: Diagnosis not present

## 2018-01-25 DIAGNOSIS — C9000 Multiple myeloma not having achieved remission: Secondary | ICD-10-CM | POA: Diagnosis not present

## 2018-01-25 LAB — BASIC METABOLIC PANEL
ANION GAP: 12 (ref 5–15)
Anion gap: 11 (ref 5–15)
BUN: 46 mg/dL — ABNORMAL HIGH (ref 6–20)
BUN: 45 mg/dL — ABNORMAL HIGH (ref 6–20)
CALCIUM: 12.5 mg/dL — AB (ref 8.9–10.3)
CHLORIDE: 104 mmol/L (ref 101–111)
CO2: 24 mmol/L (ref 22–32)
CO2: 22 mmol/L (ref 22–32)
CREATININE: 2.67 mg/dL — AB (ref 0.61–1.24)
CREATININE: 2.47 mg/dL — AB (ref 0.61–1.24)
Calcium: 11.5 mg/dL — ABNORMAL HIGH (ref 8.9–10.3)
Chloride: 104 mmol/L (ref 101–111)
GFR calc non Af Amer: 25 mL/min — ABNORMAL LOW (ref >60)
GFR, EST AFRICAN AMERICAN: 29 mL/min — AB (ref >60)
GFR, EST AFRICAN AMERICAN: 32 mL/min — AB (ref >60)
GFR, EST NON AFRICAN AMERICAN: 27 mL/min — AB (ref >60)
Glucose, Bld: 107 mg/dL — ABNORMAL HIGH (ref 65–99)
Glucose, Bld: 113 mg/dL — ABNORMAL HIGH (ref 65–99)
Potassium: 3.8 mmol/L (ref 3.5–5.1)
Potassium: 3.2 mmol/L — ABNORMAL LOW (ref 3.5–5.1)
SODIUM: 139 mmol/L (ref 135–145)
SODIUM: 138 mmol/L (ref 135–145)

## 2018-01-25 LAB — PROTEIN ELECTROPHORESIS, SERUM
A/G Ratio: 1.3 (ref 0.7–1.7)
ALBUMIN ELP: 3.2 g/dL (ref 2.9–4.4)
Alpha-1-Globulin: 0.3 g/dL (ref 0.0–0.4)
Alpha-2-Globulin: 0.8 g/dL (ref 0.4–1.0)
Beta Globulin: 0.8 g/dL (ref 0.7–1.3)
GLOBULIN, TOTAL: 2.4 g/dL (ref 2.2–3.9)
Gamma Globulin: 0.5 g/dL (ref 0.4–1.8)
Total Protein ELP: 5.6 g/dL — ABNORMAL LOW (ref 6.0–8.5)

## 2018-01-25 LAB — CBC WITH DIFFERENTIAL/PLATELET
BASOS ABS: 0 10*3/uL (ref 0.0–0.1)
BASOS PCT: 0 %
EOS ABS: 0.1 10*3/uL (ref 0.0–0.7)
Eosinophils Relative: 2 %
HCT: 31.6 % — ABNORMAL LOW (ref 39.0–52.0)
HEMOGLOBIN: 10.8 g/dL — AB (ref 13.0–17.0)
Lymphocytes Relative: 22 %
Lymphs Abs: 0.9 10*3/uL (ref 0.7–4.0)
MCH: 32.4 pg (ref 26.0–34.0)
MCHC: 34.2 g/dL (ref 30.0–36.0)
MCV: 94.9 fL (ref 78.0–100.0)
Monocytes Absolute: 0.6 10*3/uL (ref 0.1–1.0)
Monocytes Relative: 14 %
NEUTROS ABS: 2.6 10*3/uL (ref 1.7–7.7)
NEUTROS PCT: 62 %
Platelets: 143 10*3/uL — ABNORMAL LOW (ref 150–400)
RBC: 3.33 MIL/uL — AB (ref 4.22–5.81)
RDW: 14 % (ref 11.5–15.5)
WBC: 4.1 10*3/uL (ref 4.0–10.5)

## 2018-01-25 LAB — PROTIME-INR
INR: 1.13
Prothrombin Time: 14.4 seconds (ref 11.4–15.2)

## 2018-01-25 LAB — UPEP/UIFE/LIGHT CHAINS/TP, 24-HR UR
% BETA, URINE: 30.6 %
ALPHA 1 URINE: 1 %
Albumin, U: 37.6 %
Alpha 2, Urine: 10.4 %
FREE KAPPA LT CHAINS, UR: 21.3 mg/L (ref 1.35–24.19)
FREE KAPPA/LAMBDA RATIO: 0.08 — AB (ref 2.04–10.37)
Free Lambda Lt Chains,Ur: 265 mg/L — ABNORMAL HIGH (ref 0.24–6.66)
GAMMA GLOBULIN URINE: 20.3 %
M-SPIKE %, Urine: 4.8 % — ABNORMAL HIGH
Total Protein, Urine: 17.7 mg/dL

## 2018-01-25 LAB — KAPPA/LAMBDA LIGHT CHAINS
KAPPA, LAMDA LIGHT CHAIN RATIO: 0.03 — AB (ref 0.26–1.65)
Kappa free light chain: 11.1 mg/L (ref 3.3–19.4)
Lambda free light chains: 432.2 mg/L — ABNORMAL HIGH (ref 5.7–26.3)

## 2018-01-25 MED ORDER — SODIUM CHLORIDE 0.9 % IV SOLN
INTRAVENOUS | Status: DC
  Administered 2018-01-25 (×2): via INTRAVENOUS
  Administered 2018-01-26: 150 mL/h via INTRAVENOUS

## 2018-01-25 MED ORDER — SODIUM CHLORIDE 0.9 % IV SOLN
INTRAVENOUS | Status: AC
  Administered 2018-01-25: 02:00:00 via INTRAVENOUS

## 2018-01-25 MED ORDER — LIDOCAINE HCL 1 % IJ SOLN
INTRAMUSCULAR | Status: AC
  Filled 2018-01-25: qty 20

## 2018-01-25 MED ORDER — FENTANYL CITRATE (PF) 100 MCG/2ML IJ SOLN
INTRAMUSCULAR | Status: DC | PRN
  Administered 2018-01-25 (×3): 50 ug via INTRAVENOUS

## 2018-01-25 MED ORDER — SODIUM CHLORIDE 0.9 % IV SOLN
INTRAVENOUS | Status: DC | PRN
  Administered 2018-01-25: 10 mL/h via INTRAVENOUS

## 2018-01-25 MED ORDER — MIDAZOLAM HCL 2 MG/2ML IJ SOLN
INTRAMUSCULAR | Status: DC | PRN
  Administered 2018-01-25 (×2): 1 mg via INTRAVENOUS

## 2018-01-25 MED ORDER — MIDAZOLAM HCL 2 MG/2ML IJ SOLN
INTRAMUSCULAR | Status: AC
  Filled 2018-01-25: qty 4

## 2018-01-25 MED ORDER — ASPIRIN 81 MG PO CHEW
81.0000 mg | CHEWABLE_TABLET | Freq: Every day | ORAL | Status: DC
  Administered 2018-01-25 – 2018-01-26 (×2): 81 mg via ORAL
  Filled 2018-01-25 (×2): qty 1

## 2018-01-25 MED ORDER — FENTANYL CITRATE (PF) 100 MCG/2ML IJ SOLN
INTRAMUSCULAR | Status: AC
  Filled 2018-01-25: qty 4

## 2018-01-25 MED ORDER — POTASSIUM CHLORIDE 10 MEQ/100ML IV SOLN
10.0000 meq | INTRAVENOUS | Status: AC
  Administered 2018-01-25 (×4): 10 meq via INTRAVENOUS
  Filled 2018-01-25 (×4): qty 100

## 2018-01-25 MED ORDER — ENSURE ENLIVE PO LIQD: 237.0000 mL | Freq: Three times a day (TID) | ORAL | Status: DC

## 2018-01-25 MED ORDER — DEXAMETHASONE 6 MG PO TABS
40.0000 mg | ORAL_TABLET | Freq: Every day | ORAL | Status: DC
  Administered 2018-01-25 – 2018-01-26 (×2): 40 mg via ORAL
  Filled 2018-01-25 (×2): qty 1

## 2018-01-25 NOTE — Sedation Documentation (Signed)
Patient is resting comfortably. 

## 2018-01-25 NOTE — Progress Notes (Addendum)
PROGRESS NOTE    Paul Green  ZSW:[id number] DOB: Mar 29, 1961 DOA: 01/23/2018 PCP: [name], MD   Brief Narrative: Paul Green is a 57 y.o. male with no medical history. He presented with back pain and weakness and found to have metastatic disease and associated pathologic fracture. Oncology consulted. Bone marrow biopsy 3/21.   Assessment & Plan:   Active Problems:   Hypercalcemia   AKI (acute kidney injury) ([place])   Back pain   Closed compression fracture of L1 lumbar vertebra ([facility])   Hypercalcemia Improved with IV fluids, calcitonin and pamidronate but still elevated. -Continue IV fluids -Oncology recommendations -BMP  Cancer Unknown source. Concern for multiple myeloma. Multiple lesions down thoracic and lumbar spine. No spinal cord impingement. S/p bone marrow biopsy this morning. -Oncology recommendations:  -SPEP/UPEP, protein electrophoresis, PTH, PTH-rp, immunofixation electropharesis pending  Compression fracture Pathologic secondary to metastatic disease. L1, L3 lumbar vertebra. -analgesics prn  Acute kidney injury Unknown baseline. Possible this could be chronic. -IV fluids -Outpatient follow-up with [facility]; discussed with on-call nephrology  Back pain Related to above. -analgesics prn   DVT prophylaxis: Lovenox Code Status:   Code Status: Full Code Family Communication: None at bedside Disposition Plan: Pending oncology workup/improvement of hypercalcemia   Consultants:   Oncology  Procedures:   Bone marrow biopsy (3/21)  Antimicrobials:  None    Subjective: No issues overnight.  Objective: Vitals:   01/25/18 0900 01/25/18 0905 01/25/18 1022 01/25/18 1113  BP: 129/74 129/77 125/76 134/74  Pulse: 81 80 72 74  Resp: _0 Temp:    (!) 97.2 F (36.2 C)  TempSrc:    Oral  SpO2: 99% 96% 97% 98%  Weight:      Height:        Intake/Output Summary (Last 24 hours) at 01/25/2018 1414 Last data filed at 01/25/2018  1100 Gross per 24 hour  Intake 3250 ml  Output 2415 ml  Net 835 ml   Filed Weights   01/23/18 2232 01/24/18 0048  Weight: 78.9 kg (174 lb) 80.6 kg (177 lb 11.2 oz)    Examination:  General exam: Appears calm and comfortable Central nervous system: Alert and oriented. No focal neurological deficits. Psychiatry: Judgement and insight appear normal. Mood & affect appropriate.     Data Reviewed: I have personally reviewed following labs and imaging studies  CBC: Recent Labs  Lab 01/23/18 1843 01/24/18 0109 01/24/18 0327 01/25/18 0611  WBC 4.8 3.4* 3.5* 4.1  NEUTROABS  --  2.2  --  2.6  HGB 11.0* 9.9* 9.1* 10.8*  HCT 32.2* 28.9* 26.9* 31.6*  MCV 95.0 94.1 94.1 94.9  PLT 144* 110* 117* 322*   Basic Metabolic Panel: Recent Labs  Lab 01/23/18 1843 01/24/18 0109 01/24/18 0327 01/24/18 0753 01/24/18 1351 01/24/18 2132 01/25/18 0611  NA 137 138 137 138 139 137 139  K 3.8 3.1* 3.3* 3.6 3.2* 3.3* 3.8  CL 94* 99* 100* 100* 103 102 104  CO2 _1 GLUCOSE 101* 108* 111* 129* 110* 107* 107*  BUN 51* 53* 53* 53* 53* 52* 46*  CREATININE 2.62* 2.60* 2.67* 2.74* 2.79* 2.72* 2.67*  CALCIUM >13.0* 14.2* 13.3* 13.3* 12.8* 12.2* 12.5*  MG 2.2  --  1.8  --   --   --   --   PHOS  --  2.9 3.5  --   --   --   --    GFR: Estimated Creatinine Clearance: 30.4 mL/min (A) (by  C-G formula based on SCr of 2.67 mg/dL (H)). Liver Function Tests: Recent Labs  Lab 01/23/18 1843 01/24/18 0327  AST 46* 36  ALT 32 26  ALKPHOS 91 71  BILITOT 0.8 0.7  PROT 6.8 5.6*  ALBUMIN 4.1 3.2*   Recent Labs  Lab 01/23/18 1843  LIPASE 31   No results for input(s): AMMONIA in the last 168 hours. Coagulation Profile: Recent Labs  Lab 01/25/18 0611  INR 1.13   Cardiac Enzymes: Recent Labs  Lab 01/24/18 0109  CKTOTAL 134   BNP (last 3 results) No results for input(s): PROBNP in the last 8760 hours. HbA1C: No results for input(s): HGBA1C in the last 72  hours. CBG: Recent Labs  Lab 01/23/18 1856  GLUCAP 91   Lipid Profile: No results for input(s): CHOL, HDL, LDLCALC, TRIG, CHOLHDL, LDLDIRECT in the last 72 hours. Thyroid Function Tests: Recent Labs    01/24/18 0109  TSH 1.019   Anemia Panel: Recent Labs    01/24/18 0327  VITAMINB12 186  FOLATE 9.3  FERRITIN 1,214*  TIBC 218*  IRON 70  RETICCTPCT 1.9   Sepsis Labs: No results for input(s): PROCALCITON, LATICACIDVEN in the last 168 hours.  No results found for this or any previous visit (from the past 240 hour(s)).       Radiology Studies: Dg Chest 2 View  Result Date: 01/23/2018 CLINICAL DATA:  Generalized weakness. 30 pound weight loss. Hypercalcemia. EXAM: CHEST - 2 VIEW COMPARISON:  None. FINDINGS: Low lung volumes. Normal heart size and mediastinal contours with mild aortic atherosclerosis. Streaky bibasilar opacities are likely atelectasis. No evidence of focal pulmonary mass. No pulmonary edema, pleural effusion or pneumothorax. No acute osseous abnormalities are seen. IMPRESSION: Low lung volumes with bibasilar atelectasis. Electronically Signed   By: Jeb Levering M.D.   On: 01/23/2018 23:35   Dg Lumbar Spine Complete  Result Date: 01/23/2018 CLINICAL DATA:  30 pound weight loss. Hypercalcemia. Generalized weakness and back pain. EXAM: LUMBAR SPINE - COMPLETE 4+ VIEW COMPARISON:  None. FINDINGS: L1 compression fracture with approximately 40% loss of height anteriorly. No posterior cortex involvement. Remaining lumbar vertebral body heights are maintained. Diffuse endplate spurring with mild diffuse disc space narrowing. The alignment is maintained. Sacroiliac joints are congruent. No radiographic evidence of focal bone lesion. IMPRESSION: 1. L1 compression fracture with 40% loss of height anteriorly, age indeterminate. 2. Mild diffuse spondylosis with endplate spurring and diffuse disc space narrowing. Electronically Signed   By: Jeb Levering M.D.   On:  01/23/2018 23:37   Mr Thoracic Spine Wo Contrast  Result Date: 01/24/2018 CLINICAL DATA:  57 year old male with unexplained 30 lb weight loss, fatigue. Back pain since December, which recently became severe with back spasm. Hypercalcemia. L1 compression fracture on lumbar radiographs yesterday. IV contrast was deferred at this time due to renal insufficiency (estimated GFR 25). EXAM: MRI THORACIC AND LUMBAR SPINE WITHOUT CONTRAST TECHNIQUE: Multiplanar and multiecho pulse sequences of the thoracic and lumbar spine were obtained without intravenous contrast. COMPARISON:  Chest and lumbar radiographs 01/23/2018. FINDINGS: MRI THORACIC SPINE FINDINGS Limited cervical spine imaging: Diffusely abnormal bone marrow signal throughout the cervical spine, vertebral and posterior element involvement. Possible C6 ventral epidural extension of the abnormality with mild C6-C7 spinal stenosis suspected (series 19001, image 6). Similar abnormal marrow signal at the skull base. Abnormal marrow signal in the visible sternum and manubrium. Thoracic spine segmentation:  Normal. Alignment: Preserved thoracic vertebral height and kyphosis. Borderline to mild loss of mid and lower thoracic  vertebral height such as T7 and T8. Vertebrae: Diffusely abnormal marrow signal throughout the thoracic vertebrae including vertebral body, pedicle, posterior element involvement, and bilateral rib involvement. Abnormally expanded left T4 pedicle with early extension of the abnormality and to the left ventral epidural space and the left T4 neural foramen (series 8001, image 13). More moderate ventral epidural extension of tumor at the T8 and T9 levels (images 25 and 28). The appearance is more pronounced at T9 in greater on the right. But there is no significant spinal stenosis. However, there is moderate involvement of the right T9 neural foramen which appears moderately to severely narrow. Mild-to-moderate ventral epidural extension at T11.  Moderate left T11 neural foraminal involvement and mild to moderate foraminal stenosis. Similar epidural and right foraminal involvement at T12, with moderate to severe right T12 foraminal stenosis. Cord: Thoracic spinal cord signal and morphology remains normal at all levels. There is borderline to Mild spinal stenosis at T9 (series 23001, image 28). The conus medullaris is normal at T12-L1. Paraspinal and other soft tissues: Intermittent wrap artifact on axial images. Negative visible thoracic and upper abdominal viscera. The posterior paraspinal soft tissues remain within normal limits. Disc levels: No significant disc degeneration. MRI LUMBAR SPINE FINDINGS Segmentation: Normal, concordant with the thoracic spine numbering today. Alignment:  Stable alignment.  No spondylolisthesis. Vertebrae: Diffusely abnormal marrow signal throughout the visible lumbosacral spine and pelvis in keeping with the cervical and thoracic findings. Moderate compression fracture of L1 with central loss of vertebral body height up to 52%. Early ventral epidural extension of tumor on the left side (series 4001, image 9). Early left foraminal involvement without definite foraminal stenosis. Similar early ventral epidural and foraminal involvement on the left at L2. No stenosis. Mild compression fracture of the right lateral L3 vertebral body best demonstrated on series 6001, image 13. Early right L3 neural foraminal involvement without stenosis. Early ventral epidural tumor involvement at L4. No malignant stenosis at that level, although there is combined mild to moderate right L4 foraminal stenosis which is in part related to degenerative facet hypertrophy. Early ventral epidural involvement at L5.  No stenosis. Diffuse visible sacral and pelvic involvement with probable ventral presacral space extension of tumor. Conus medullaris and cauda equina: Conus extends to the T12-L1 level. No thickening of the cauda equina nerve roots.  Paraspinal and other soft tissues: Moderate edema in the medial right psoas muscle from L2-L3 to the L5 level. Patchy edema in the medial erector spinae muscles at the L3 through L5 spinous process levels (series 6001, image 1). Negative visible abdominal viscera. No retroperitoneal lymphadenopathy. Disc levels: No significant disc degeneration. IMPRESSION: 1. 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 Top differential considerations include lymphoma, multiple myeloma, and widespread metastatic disease. 2. Multilevel tumor extension into the epidural space, although generally mild. There is borderline to mild malignant spinal stenosis suspected at both C6 and T9. No spinal cord mass effect or signal abnormality. 3. Multilevel tumor extension into the neural foramina: Left T4, right T9, left T11, right T12, right L3 nerve levels. 4. Pathologic compression fractures of L1 (50%) and the right L3 (mild) vertebral bodies. Minimal to mild loss of midthoracic vertebral body height. 5. Reactive medial right psoas muscle and medial lumbar erector spinae muscle edema. 6. Negative partially visible thoracic and abdominal viscera. Electronically Signed   By: Genevie Ann M.D.   On: 01/24/2018 10:32  Mr Lumbar Spine Wo Contrast  Result Date: 01/24/2018 CLINICAL DATA:  57 year old male with unexplained 30 lb weight loss, fatigue. Back pain since December, which recently became severe with back spasm. Hypercalcemia. L1 compression fracture on lumbar radiographs yesterday. IV contrast was deferred at this time due to renal insufficiency (estimated GFR 25). EXAM: MRI THORACIC AND LUMBAR SPINE WITHOUT CONTRAST TECHNIQUE: Multiplanar and multiecho pulse sequences of the thoracic and lumbar spine were obtained without intravenous contrast. COMPARISON:  Chest and lumbar radiographs 01/23/2018. FINDINGS: MRI THORACIC  SPINE FINDINGS Limited cervical spine imaging: Diffusely abnormal bone marrow signal throughout the cervical spine, vertebral and posterior element involvement. Possible C6 ventral epidural extension of the abnormality with mild C6-C7 spinal stenosis suspected (series 19001, image 6). Similar abnormal marrow signal at the skull base. Abnormal marrow signal in the visible sternum and manubrium. Thoracic spine segmentation:  Normal. Alignment: Preserved thoracic vertebral height and kyphosis. Borderline to mild loss of mid and lower thoracic vertebral height such as T7 and T8. Vertebrae: Diffusely abnormal marrow signal throughout the thoracic vertebrae including vertebral body, pedicle, posterior element involvement, and bilateral rib involvement. Abnormally expanded left T4 pedicle with early extension of the abnormality and to the left ventral epidural space and the left T4 neural foramen (series 8001, image 13). More moderate ventral epidural extension of tumor at the T8 and T9 levels (images 25 and 28). The appearance is more pronounced at T9 in greater on the right. But there is no significant spinal stenosis. However, there is moderate involvement of the right T9 neural foramen which appears moderately to severely narrow. Mild-to-moderate ventral epidural extension at T11. Moderate left T11 neural foraminal involvement and mild to moderate foraminal stenosis. Similar epidural and right foraminal involvement at T12, with moderate to severe right T12 foraminal stenosis. Cord: Thoracic spinal cord signal and morphology remains normal at all levels. There is borderline to Mild spinal stenosis at T9 (series 23001, image 28). The conus medullaris is normal at T12-L1. Paraspinal and other soft tissues: Intermittent wrap artifact on axial images. Negative visible thoracic and upper abdominal viscera. The posterior paraspinal soft tissues remain within normal limits. Disc levels: No significant disc degeneration. MRI  LUMBAR SPINE FINDINGS Segmentation: Normal, concordant with the thoracic spine numbering today. Alignment:  Stable alignment.  No spondylolisthesis. Vertebrae: Diffusely abnormal marrow signal throughout the visible lumbosacral spine and pelvis in keeping with the cervical and thoracic findings. Moderate compression fracture of L1 with central loss of vertebral body height up to 52%. Early ventral epidural extension of tumor on the left side (series 4001, image 9). Early left foraminal involvement without definite foraminal stenosis. Similar early ventral epidural and foraminal involvement on the left at L2. No stenosis. Mild compression fracture of the right lateral L3 vertebral body best demonstrated on series 6001, image 13. Early right L3 neural foraminal involvement without stenosis. Early ventral epidural tumor involvement at L4. No malignant stenosis at that level, although there is combined mild to moderate right L4 foraminal stenosis which is in part related to degenerative facet hypertrophy. Early ventral epidural involvement at L5.  No stenosis. Diffuse visible sacral and pelvic involvement with probable ventral presacral space extension of tumor. Conus medullaris and cauda equina: Conus extends to the T12-L1 level. No thickening of the cauda equina nerve roots. Paraspinal and other soft tissues: Moderate edema in the medial right psoas muscle from L2-L3 to the L5 level. Patchy edema in the medial erector spinae muscles at the L3 through L5 spinous process levels (  series 6001, image 1). Negative visible abdominal viscera. No retroperitoneal lymphadenopathy. Disc levels: No significant disc degeneration. IMPRESSION: 1. 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 Top differential considerations include lymphoma, multiple myeloma, and widespread metastatic disease. 2. Multilevel tumor  extension into the epidural space, although generally mild. There is borderline to mild malignant spinal stenosis suspected at both C6 and T9. No spinal cord mass effect or signal abnormality. 3. Multilevel tumor extension into the neural foramina: Left T4, right T9, left T11, right T12, right L3 nerve levels. 4. Pathologic compression fractures of L1 (50%) and the right L3 (mild) vertebral bodies. Minimal to mild loss of midthoracic vertebral body height. 5. Reactive medial right psoas muscle and medial lumbar erector spinae muscle edema. 6. Negative partially visible thoracic and abdominal viscera. Electronically Signed   By: Genevie Ann M.D.   On: 01/24/2018 10:32   Ct Bone Marrow Biopsy & Aspiration  Result Date: 01/25/2018 INDICATION: 57 year old with pancytopenia and concern for myeloma. Multiple bone lesions. EXAM: CT GUIDED BONE MARROW ASPIRATES AND BIOPSY Physician: Stephan Minister. Anselm Pancoast, MD MEDICATIONS: None. ANESTHESIA/SEDATION: Fentanyl 150 mcg IV; Versed 2.0 mg IV Moderate Sedation Time:  20 minutes The patient was continuously monitored during the procedure by the interventional radiology nurse under my direct supervision. COMPLICATIONS: None immediate. PROCEDURE: The procedure was explained to the patient. The risks and benefits of the procedure were discussed and the patient's questions were addressed. Informed consent was obtained from the patient. The patient was placed prone on CT table. Images of the pelvis were obtained. The left side of back was prepped and draped in sterile fashion. The skin and left posterior ilium were anesthetized with 1% lidocaine. 11 gauge bone needle was directed into the left ilium with CT guidance. Two aspirates and 3 core biopsies were performed. Minimal fluid could be collected for aspiration. Two adequate core biopsies were obtained. Bandage placed over the puncture site. FINDINGS: Numerous lytic lesions scattered throughout the pelvic bones. In particular, there is an  expansile partially destructive lesion involving the posterior right ilium measuring up to 3.2 cm. IMPRESSION: CT guided bone marrow aspiration and core biopsy. Multiple lytic bone lesions. Imaging findings are suggestive for metastatic disease or myeloma. Electronically Signed   By: Markus Daft M.D.   On: 01/25/2018 10:53        Scheduled Meds: . feeding supplement (ENSURE ENLIVE)  237 mL Oral BID BM  . fentaNYL      . lidocaine      . midazolam       Continuous Infusions: . sodium chloride 10 mL/hr (01/25/18 0844)  . sodium chloride 150 mL/hr at 01/25/18 1303  . methocarbamol (ROBAXIN)  IV Stopped (01/24/18 0146)     LOS: 2 days     Cordelia Poche, MD [facility] 01/25/2018, 2:14 PM Pager: [phone]  If 7PM-7AM, please contact night-coverage [email or web address] Password [facility] 01/25/2018, 2:14 PM

## 2018-01-25 NOTE — Discharge Instructions (Addendum)
Nutrition Post Hospital Stay Proper nutrition can help your body recover from illness and injury.   Foods and beverages high in protein, vitamins, and minerals help rebuild muscle loss, promote healing, & reduce fall risk.   .In addition to eating healthy foods, a nutrition shake is an easy, delicious way to get the nutrition you need during and after your hospital stay  It is recommended that you continue to drink 2 bottles per day of:       Ensure for at least 1 month (30 days) after your hospital stay   Tips for adding a nutrition shake into your routine: As allowed, drink one with vitamins or medications instead of water or juice Enjoy one as a tasty mid-morning or afternoon snack Drink cold or make a milkshake out of it Drink one instead of milk with cereal or snacks Use as a coffee creamer   Available at the following grocery stores and pharmacies:           * [employer] * [employer] * [employer]  * [employer]          * [employer] * [employer]  * [facility]      * Target  * [employer]   * [facility]  * [employer]   * [facility] [phone]            For COUPONS visit: [email or web address] or [email or web address]   Suggested Substitutions Ensure Plus = Boost Plus = Carnation Breakfast Essentials = Boost Compact Ensure Active Clear = Boost Breeze Glucerna Shake = Boost Glucose Control = Carnation Breakfast Essentials SUGAR FREE       

## 2018-01-25 NOTE — Progress Notes (Addendum)
IP PROGRESS NOTE  Subjective:   No new complaint.  He feels better compared to hospital admission.  Objective: Vital signs in last 24 hours: Blood pressure (P) 125/75, pulse (P) 84, temperature (!) (P) 97.5 F (36.4 C), temperature source (P) Oral, resp. rate (P) 17, height '5\' 6"'  (1.676 m), weight 177 lb 11.2 oz (80.6 kg), SpO2 (P) 97 %.  Intake/Output from previous day: 03/21 0701 - 03/22 0700 In: 2827.5 [P.O.:820; I.V.:1707.5; IV Piggyback:300] Out: 2740 [Urine:2740]  Physical Exam:  HEENT: No thrush Abdomen: No hepatosplenomegaly Extremities: No leg edema Neurologic: Good leg and foot strength bilaterally, neck pain with left and right leg raise    Lab Results: Recent Labs    01/24/18 0109 01/24/18 0327  WBC 3.4* 3.5*  HGB 9.9* 9.1*  HCT 28.9* 26.9*  PLT 110* 117*    BMET Recent Labs    01/24/18 1351 01/24/18 2132  NA 139 137  K 3.2* 3.3*  CL 103 102  CO2 29 26  GLUCOSE 110* 107*  BUN 53* 52*  CREATININE 2.79* 2.72*  CALCIUM 12.8* 12.2*    No results found for: CEA1  Studies/Results: Dg Chest 2 View  Result Date: 01/23/2018 CLINICAL DATA:  Generalized weakness. 30 pound weight loss. Hypercalcemia. EXAM: CHEST - 2 VIEW COMPARISON:  None. FINDINGS: Low lung volumes. Normal heart size and mediastinal contours with mild aortic atherosclerosis. Streaky bibasilar opacities are likely atelectasis. No evidence of focal pulmonary mass. No pulmonary edema, pleural effusion or pneumothorax. No acute osseous abnormalities are seen. IMPRESSION: Low lung volumes with bibasilar atelectasis. Electronically Signed   By: Jeb Levering M.D.   On: 01/23/2018 23:35   Dg Lumbar Spine Complete  Result Date: 01/23/2018 CLINICAL DATA:  30 pound weight loss. Hypercalcemia. Generalized weakness and back pain. EXAM: LUMBAR SPINE - COMPLETE 4+ VIEW COMPARISON:  None. FINDINGS: L1 compression fracture with approximately 40% loss of height anteriorly. No posterior cortex  involvement. Remaining lumbar vertebral body heights are maintained. Diffuse endplate spurring with mild diffuse disc space narrowing. The alignment is maintained. Sacroiliac joints are congruent. No radiographic evidence of focal bone lesion. IMPRESSION: 1. L1 compression fracture with 40% loss of height anteriorly, age indeterminate. 2. Mild diffuse spondylosis with endplate spurring and diffuse disc space narrowing. Electronically Signed   By: Jeb Levering M.D.   On: 01/23/2018 23:37   Mr Thoracic Spine Wo Contrast  Result Date: 01/24/2018 CLINICAL DATA:  57 year old male with unexplained 30 lb weight loss, fatigue. Back pain since December, which recently became severe with back spasm. Hypercalcemia. L1 compression fracture on lumbar radiographs yesterday. IV contrast was deferred at this time due to renal insufficiency (estimated GFR 25). EXAM: MRI THORACIC AND LUMBAR SPINE WITHOUT CONTRAST TECHNIQUE: Multiplanar and multiecho pulse sequences of the thoracic and lumbar spine were obtained without intravenous contrast. COMPARISON:  Chest and lumbar radiographs 01/23/2018. FINDINGS: MRI THORACIC SPINE FINDINGS Limited cervical spine imaging: Diffusely abnormal bone marrow signal throughout the cervical spine, vertebral and posterior element involvement. Possible C6 ventral epidural extension of the abnormality with mild C6-C7 spinal stenosis suspected (series 19001, image 6). Similar abnormal marrow signal at the skull base. Abnormal marrow signal in the visible sternum and manubrium. Thoracic spine segmentation:  Normal. Alignment: Preserved thoracic vertebral height and kyphosis. Borderline to mild loss of mid and lower thoracic vertebral height such as T7 and T8. Vertebrae: Diffusely abnormal marrow signal throughout the thoracic vertebrae including vertebral body, pedicle, posterior element involvement, and bilateral rib involvement. Abnormally expanded left T4  pedicle with early extension of the  abnormality and to the left ventral epidural space and the left T4 neural foramen (series 8001, image 13). More moderate ventral epidural extension of tumor at the T8 and T9 levels (images 25 and 28). The appearance is more pronounced at T9 in greater on the right. But there is no significant spinal stenosis. However, there is moderate involvement of the right T9 neural foramen which appears moderately to severely narrow. Mild-to-moderate ventral epidural extension at T11. Moderate left T11 neural foraminal involvement and mild to moderate foraminal stenosis. Similar epidural and right foraminal involvement at T12, with moderate to severe right T12 foraminal stenosis. Cord: Thoracic spinal cord signal and morphology remains normal at all levels. There is borderline to Mild spinal stenosis at T9 (series 23001, image 28). The conus medullaris is normal at T12-L1. Paraspinal and other soft tissues: Intermittent wrap artifact on axial images. Negative visible thoracic and upper abdominal viscera. The posterior paraspinal soft tissues remain within normal limits. Disc levels: No significant disc degeneration. MRI LUMBAR SPINE FINDINGS Segmentation: Normal, concordant with the thoracic spine numbering today. Alignment:  Stable alignment.  No spondylolisthesis. Vertebrae: Diffusely abnormal marrow signal throughout the visible lumbosacral spine and pelvis in keeping with the cervical and thoracic findings. Moderate compression fracture of L1 with central loss of vertebral body height up to 52%. Early ventral epidural extension of tumor on the left side (series 4001, image 9). Early left foraminal involvement without definite foraminal stenosis. Similar early ventral epidural and foraminal involvement on the left at L2. No stenosis. Mild compression fracture of the right lateral L3 vertebral body best demonstrated on series 6001, image 13. Early right L3 neural foraminal involvement without stenosis. Early ventral epidural  tumor involvement at L4. No malignant stenosis at that level, although there is combined mild to moderate right L4 foraminal stenosis which is in part related to degenerative facet hypertrophy. Early ventral epidural involvement at L5.  No stenosis. Diffuse visible sacral and pelvic involvement with probable ventral presacral space extension of tumor. Conus medullaris and cauda equina: Conus extends to the T12-L1 level. No thickening of the cauda equina nerve roots. Paraspinal and other soft tissues: Moderate edema in the medial right psoas muscle from L2-L3 to the L5 level. Patchy edema in the medial erector spinae muscles at the L3 through L5 spinous process levels (series 6001, image 1). Negative visible abdominal viscera. No retroperitoneal lymphadenopathy. Disc levels: No significant disc degeneration. IMPRESSION: 1. 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 Top differential considerations include lymphoma, multiple myeloma, and widespread metastatic disease. 2. Multilevel tumor extension into the epidural space, although generally mild. There is borderline to mild malignant spinal stenosis suspected at both C6 and T9. No spinal cord mass effect or signal abnormality. 3. Multilevel tumor extension into the neural foramina: Left T4, right T9, left T11, right T12, right L3 nerve levels. 4. Pathologic compression fractures of L1 (50%) and the right L3 (mild) vertebral bodies. Minimal to mild loss of midthoracic vertebral body height. 5. Reactive medial right psoas muscle and medial lumbar erector spinae muscle edema. 6. Negative partially visible thoracic and abdominal viscera. Electronically Signed   By: Genevie Ann M.D.   On: 01/24/2018 10:32   Mr Lumbar Spine Wo Contrast  Result Date: 01/24/2018 CLINICAL DATA:  57 year old male with unexplained 30 lb weight loss, fatigue. Back pain since  December, which recently became severe  with back spasm. Hypercalcemia. L1 compression fracture on lumbar radiographs yesterday. IV contrast was deferred at this time due to renal insufficiency (estimated GFR 25). EXAM: MRI THORACIC AND LUMBAR SPINE WITHOUT CONTRAST TECHNIQUE: Multiplanar and multiecho pulse sequences of the thoracic and lumbar spine were obtained without intravenous contrast. COMPARISON:  Chest and lumbar radiographs 01/23/2018. FINDINGS: MRI THORACIC SPINE FINDINGS Limited cervical spine imaging: Diffusely abnormal bone marrow signal throughout the cervical spine, vertebral and posterior element involvement. Possible C6 ventral epidural extension of the abnormality with mild C6-C7 spinal stenosis suspected (series 19001, image 6). Similar abnormal marrow signal at the skull base. Abnormal marrow signal in the visible sternum and manubrium. Thoracic spine segmentation:  Normal. Alignment: Preserved thoracic vertebral height and kyphosis. Borderline to mild loss of mid and lower thoracic vertebral height such as T7 and T8. Vertebrae: Diffusely abnormal marrow signal throughout the thoracic vertebrae including vertebral body, pedicle, posterior element involvement, and bilateral rib involvement. Abnormally expanded left T4 pedicle with early extension of the abnormality and to the left ventral epidural space and the left T4 neural foramen (series 8001, image 13). More moderate ventral epidural extension of tumor at the T8 and T9 levels (images 25 and 28). The appearance is more pronounced at T9 in greater on the right. But there is no significant spinal stenosis. However, there is moderate involvement of the right T9 neural foramen which appears moderately to severely narrow. Mild-to-moderate ventral epidural extension at T11. Moderate left T11 neural foraminal involvement and mild to moderate foraminal stenosis. Similar epidural and right foraminal involvement at T12, with moderate to severe right  T12 foraminal stenosis. Cord: Thoracic spinal cord signal and morphology remains normal at all levels. There is borderline to Mild spinal stenosis at T9 (series 23001, image 28). The conus medullaris is normal at T12-L1. Paraspinal and other soft tissues: Intermittent wrap artifact on axial images. Negative visible thoracic and upper abdominal viscera. The posterior paraspinal soft tissues remain within normal limits. Disc levels: No significant disc degeneration. MRI LUMBAR SPINE FINDINGS Segmentation: Normal, concordant with the thoracic spine numbering today. Alignment:  Stable alignment.  No spondylolisthesis. Vertebrae: Diffusely abnormal marrow signal throughout the visible lumbosacral spine and pelvis in keeping with the cervical and thoracic findings. Moderate compression fracture of L1 with central loss of vertebral body height up to 52%. Early ventral epidural extension of tumor on the left side (series 4001, image 9). Early left foraminal involvement without definite foraminal stenosis. Similar early ventral epidural and foraminal involvement on the left at L2. No stenosis. Mild compression fracture of the right lateral L3 vertebral body best demonstrated on series 6001, image 13. Early right L3 neural foraminal involvement without stenosis. Early ventral epidural tumor involvement at L4. No malignant stenosis at that level, although there is combined mild to moderate right L4 foraminal stenosis which is in part related to degenerative facet hypertrophy. Early ventral epidural involvement at L5.  No stenosis. Diffuse visible sacral and pelvic involvement with probable ventral presacral space extension of tumor. Conus medullaris and cauda equina: Conus extends to the T12-L1 level. No thickening of the cauda equina nerve roots. Paraspinal and other soft tissues: Moderate edema in the medial right psoas muscle from L2-L3 to the L5 level. Patchy edema in the medial erector spinae muscles at the L3 through L5  spinous process levels (series 6001, image 1). Negative visible abdominal viscera. No retroperitoneal lymphadenopathy. Disc levels: No significant disc degeneration. IMPRESSION: 1. 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 Top differential considerations include lymphoma, multiple myeloma, and widespread metastatic disease. 2. Multilevel tumor extension into the epidural space, although generally mild. There is borderline to mild malignant spinal stenosis suspected at both C6 and T9. No spinal cord mass effect or signal abnormality. 3. Multilevel tumor extension into the neural foramina: Left T4, right T9, left T11, right T12, right L3 nerve levels. 4. Pathologic compression fractures of L1 (50%) and the right L3 (mild) vertebral bodies. Minimal to mild loss of midthoracic vertebral body height. 5. Reactive medial right psoas muscle and medial lumbar erector spinae muscle edema. 6. Negative partially visible thoracic and abdominal viscera. Electronically Signed   By: Genevie Ann M.D.   On: 01/24/2018 10:32    Medications: I have reviewed the patient's current medications.  Assessment/Plan:  1.  Anemia/thrombocytopenia 2.  Hypercalcemia 3.  Diffuse abnormal bone marrow signal, compression fracture of L1 4.  Back pain 5.  Anorexia/weight loss 6.  Renal failure  [name] appears unchanged.  The hypercalcemia is improved compared to on hospital admission.  He is scheduled for a diagnostic bone marrow biopsy this morning.  I will review the bone marrow findings with the hematopathologist today and initiate a treatment plan.  Addendum: The bone marrow could not be aspirated.  Biopsies were obtained.  I discussed the case with the hematopathologist.  There are malignant cells on the touch prep stains.  The cells are very anaplastic and do have some plasmacytoid features.  The  differential diagnosis includes multiple myeloma, metastatic carcinoma, and lymphoma.  We will wait on immunohistochemical stains and the biopsy review to arrive at a definitive diagnosis.  I discussed the bone marrow findings with Mr. Radoncic by telephone at approximately 2:30 PM.  He reports tolerating the bone marrow biopsy procedure well.  I feel there is still a high likelihood of a myeloma diagnosis.  The laboratory evaluation for myeloma is pending.  The plan is to begin a course of pulse Decadron for palliation of the hypercalcemia, pain, and renal failure.  I reviewed the risk associated with high-dose Decadron therapy including the chance for insomnia, anxiety, psychosis, peptic ulcer disease, and hyperglycemia.  He agrees to proceed.  Recommendations: 1.  Begin 4 days of pulse Decadron. 2.  Continue intravenous hydration, follow-up calcium and creatinine levels 01/26/2018 3.  Continue calcitonin 4.  Increase ambulation as tolerated 5.  Begin aspirin for DVT prophylaxis 6.  Outpatient follow-up will be scheduled at the Cancer center for 01/29/2018  He will likely be stable for discharge 01/26/2018.  Please call Oncology as needed on 01/26/2018.    LOS: 2 days   Betsy Coder, MD   01/25/2018, 7:02 AM

## 2018-01-25 NOTE — Progress Notes (Signed)
Initial Nutrition Assessment  DOCUMENTATION CODES:   Not applicable  INTERVENTION:   -Increase Ensure Enlive po to TID, each supplement provides 350 kcal and 20 grams of protein  NUTRITION DIAGNOSIS:   Increased nutrient needs related to cancer and cancer related treatments as evidenced by estimated needs.  GOAL:   Patient will meet greater than or equal to 90% of their needs  MONITOR:   PO intake, Supplement acceptance, Labs, Weight trends, Skin, I & O's  REASON FOR ASSESSMENT:   Malnutrition Screening Tool    ASSESSMENT:   57 y.o. male with No significant medical history admitted for hypercalcemia  Pt admitted with hypercalcemia. Pt just diagnosed with metastatic cancer.   3/22- s/p bone marrow biospy  Attempted to see pt x 3, within the past 24 hours, however, pt either down for procedures or working with therapies at times of visits.   Meal completion is variable; PO: 25-100%. Pt is taking Ensure supplements.  Per MST report, pt reports wt loss, however, no history available to confirm this statement.   Unable to complete Nutrition-Focused physical exam at this time.   Given variable intake and recently diagnosed metastatic disease, pt would benefit from continued nutritional supplements.   Labs reviewed: K: 3.2 .   Diet Order:  Diet regular Room service appropriate? Yes; Fluid consistency: Thin  EDUCATION NEEDS:   No education needs have been identified at this time  Skin:  Skin Assessment: Reviewed RN Assessment  Last BM:  01/25/18  Height:   Ht Readings from Last 1 Encounters:  01/24/18 5\' 6"  (1.676 m)    Weight:   Wt Readings from Last 1 Encounters:  01/24/18 177 lb 11.2 oz (80.6 kg)    Ideal Body Weight:  64.5 kg  BMI:  Body mass index is 28.68 kg/m.  Estimated Nutritional Needs:   Kcal:  1800-2000  Protein:  90-105 grams  Fluid:  1.8-2.0 L    Ivania Teagarden A. Jimmye Norman, RD, LDN, CDE Pager: [phone] After hours Pager: [phone]

## 2018-01-25 NOTE — Procedures (Signed)
CT guided bone marrow biopsy.  Minimal aspirate material obtained.  Total of 3 cores performed and 2 adequate cores obtained.  Lytic lesions throughout pelvis and bone marrow sample obtained from the left ilium. Minimal blood loss and no immediate complication.

## 2018-01-26 LAB — BASIC METABOLIC PANEL
Anion gap: 10 (ref 5–15)
BUN: 53 mg/dL — AB (ref 6–20)
CALCIUM: 10.2 mg/dL (ref 8.9–10.3)
CO2: 23 mmol/L (ref 22–32)
Chloride: 104 mmol/L (ref 101–111)
Creatinine, Ser: 2.32 mg/dL — ABNORMAL HIGH (ref 0.61–1.24)
GFR calc Af Amer: 34 mL/min — ABNORMAL LOW (ref >60)
GFR, EST NON AFRICAN AMERICAN: 30 mL/min — AB (ref >60)
Glucose, Bld: 134 mg/dL — ABNORMAL HIGH (ref 65–99)
POTASSIUM: 4.1 mmol/L (ref 3.5–5.1)
SODIUM: 137 mmol/L (ref 135–145)

## 2018-01-26 MED ORDER — METHOCARBAMOL 500 MG PO TABS: 500.0000 mg | ORAL_TABLET | Freq: Three times a day (TID) | ORAL | 0 refills | Status: DC | PRN

## 2018-01-26 MED ORDER — ACETAMINOPHEN 325 MG PO TABS: 650.0000 mg | ORAL_TABLET | Freq: Four times a day (QID) | ORAL | Status: DC | PRN

## 2018-01-26 MED ORDER — OXYCODONE HCL 5 MG PO TABS: 5.0000 mg | ORAL_TABLET | Freq: Four times a day (QID) | ORAL | 0 refills | Status: DC | PRN

## 2018-01-26 MED ORDER — ENSURE ENLIVE PO LIQD: 237.0000 mL | Freq: Three times a day (TID) | ORAL | 0 refills | Status: DC

## 2018-01-26 MED ORDER — ASPIRIN 81 MG PO CHEW: 81.0000 mg | CHEWABLE_TABLET | Freq: Every day | ORAL | 0 refills | Status: DC

## 2018-01-26 MED ORDER — DEXAMETHASONE 4 MG PO TABS: 40.0000 mg | ORAL_TABLET | Freq: Every day | ORAL | 0 refills | Status: DC

## 2018-01-26 NOTE — Progress Notes (Signed)
I was asked to discuss the management plan for the patient.  Family had questions regarding the diagnosis as well as options of therapy.  He reports he has been doing reasonably well over the last 24 hours.  His pain is reasonably controlled and has been ambulating in the hallway.  He denies any falls or syncope.  He denied any ecchymosis or petechiae.  His appetite has improved.  He has been on dexamethasone started on 01/25/2018 and have taken 2 doses already.  Laboratory data were personally reviewed today and showed his creatinine to continue to improve currently down to 2.32 compared to 2.62 on admission.  His calcium is down to 10.2.  His light chain showed increased lambda 432.  These findings were discussed today with the patient and his family in detail.  Results of the bone marrow biopsy are pending.  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  I reiterated that this dexamethasone is part of the treatment plan and I agree with continuing 4 days total of dexamethasone total final diagnosis is made.  At that point additional therapy can be added in the form of Velcade, Cytoxan or Revlimid.  The patient and his family expressed interest of getting very definitive care at [facility].  They were inquiring about the logistics of this process.  I explained that at this point I do not see the need for hospital to hospital transfer given his medical stability.  An outpatient appointment would be sufficient once the final diagnosis is made.  All her questions were answered today to their satisfaction.  He is agreeable to be discharged today and will call Dr. Sherrill on Monday to inquire about his final diagnosis and arrangements for follow-up visit.  25  minutes was spent with the patient face-to-face today.  More than 50% of time was dedicated to patient counseling, education, answering questions  and coordination of his multifaceted care.  

## 2018-01-26 NOTE — Progress Notes (Signed)
Patient ready for discharge to home with family present. Daughter/patient have inquired about receiving radiology reports and films as pt will be treated at [facility]. Family /patient will stop at admissions and sign release of information on their way to discharging.   Patient displays no s/sx of distress. Voices no c/o pain. All personal belongings with pt. All discharge information, meds, follow up appts reviewed with patient and family.  Dr. Teryl Lucy has been instrumental in making connections for pt care at [facility] with help from patient daughter.

## 2018-01-26 NOTE — Progress Notes (Signed)
Noted consult for Outpatient Physical Therapy; patient stated that he is already going to Outpatient Physical Therapy at [place] for 3 weeks but now he is planning on going to [facility]; family members in room, all questions answered; Aneta Mins [phone]

## 2018-01-26 NOTE — Progress Notes (Signed)
Family request to speak with Dr. Teryl Lucy regarding transfer of pt to [facility] for care. Call placed to Dr. Teryl Lucy at pt/family request.

## 2018-01-26 NOTE — Discharge Summary (Signed)
Physician Discharge Summary  Latif Nazareno ZHG:[id number] DOB: 1961-02-27 DOA: 01/23/2018  PCP: [name], MD  Admit date: 01/23/2018 Discharge date: 01/26/2018  Admitted From: Home Disposition: Home  Recommendations for Outpatient Follow-up:  1. Follow up with PCP in 1 week 2. Follow up with oncology on 3/26 3. Follow up with nephrology outpatient 4. Please obtain BMP/CBC in one week 5. Please follow up on the following pending results: Bone biopsy, chromozone analysis  Home Health: None. Outpatient physical therapy Equipment/Devices: None  Discharge Condition: Stable CODE STATUS: Full code Diet recommendation: Heart healthy   Brief/Interim Summary:  Admission HPI written by Toy Baker, MD   HPI: Paul Green is a 57 y.o. male with No significant medical history     Presented with  Abnormal lab values.  Patient has been having generalized fatigue weight loss of 30 lb over past 2 months describes abdominal pain, loss of appetite. Reports months ago in September 2018  he hurt his back December 8th and is been hurting him ever since he was supposed to have MRI done but insurance denied payment. He started on PT in February with only partial improvement. He developed sever back spasms. The back pain has persisted, did not improve with Naproxen. Tried muscle relaxant with no improvement.  States few months back ( in February) his PCP noted slightly elevated Ca (10.8)  work up at that time was unremarkable PTH WNL 14, normal PSA, Vitamin D level 20.5 WNL he was sent to endocrinology.   Recently has been seen by endocrinologist was noted to do have calcium level up to 16.3 and increased creatinine up to 2.6 up from Feb 18 th when it was 1.4   Up from prior. He was sent to ER for further work up.  Had left testicular pain followed by urology it seemed to have resolved.  He has severe constipation partially relived by Miralax.  Had 2 BM's today No confusion but fatigue.  Reports body aches. No chest pain or shortness of breath.  While in ER: Ca [facility] course:  Hypercalcemia 14.2 on admission. Improved with IV fluids, calcitonin and pamidronate. Down to 10.2 prior to discharge.  Malignant bone lesions Multiple lesions along T/L spine. Concern for multiple myeloma. Bone biopsy performed and results are pending. SPEP/UPEP significant for increased lambda light chains, increased m-spike. Oncology started on high dose decadron and will follow-up outpatient.  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 L1, L3 lumbar vertebra. Analgesics as needed.  Acute kidney injury Baseline close to 1 (undocumented in this system). Since admission, improvement with IV fluids. Outpatient follow-up with nephrology.  Back pain Related to above. Analgesics as mentioned above.   Discharge Diagnoses:  Active Problems:   Hypercalcemia   AKI (acute kidney injury) ([place])   Back pain   Closed compression fracture of L1 lumbar vertebra [facility])    Discharge Instructions  Discharge Instructions    Call MD for:  difficulty breathing, headache or visual disturbances   Complete by:  As directed    Call MD for:  persistant nausea and vomiting   Complete by:  As directed    Call MD for:  severe uncontrolled pain   Complete by:  As directed    Call MD for:  temperature >100.4   Complete by:  As directed    Diet - low sodium heart healthy   Complete by:  As directed    Increase activity slowly   Complete by:  As directed  Allergies as of 01/26/2018   No Known Allergies     Medication List    TAKE these medications   acetaminophen 325 MG tablet Commonly known as:  TYLENOL Take 2 tablets (650 mg total) by mouth every 6 (six) hours as needed for mild pain or moderate pain.   aspirin 81 MG chewable tablet Chew 1 tablet (81 mg total) by mouth daily. Start taking on:  01/27/2018   dexamethasone 4 MG tablet Commonly known as:   DECADRON Take 10 tablets (40 mg total) by mouth daily for 2 days. Start taking on:  01/27/2018   feeding supplement (ENSURE ENLIVE) Liqd Take 237 mLs by mouth 3 (three) times daily between meals.   methocarbamol 500 MG tablet Commonly known as:  ROBAXIN Take 1 tablet (500 mg total) by mouth every 8 (eight) hours as needed for muscle spasms.   ondansetron 4 MG disintegrating tablet Commonly known as:  ZOFRAN-ODT Take 4 mg by mouth as needed.   oxyCODONE 5 MG immediate release tablet Commonly known as:  Oxy IR/ROXICODONE Take 1 tablet (5 mg total) by mouth every 6 (six) hours as needed for severe pain.   polyethylene glycol packet Commonly known as:  MIRALAX / GLYCOLAX Take 17 g by mouth daily as needed.      Follow-up Information    [name], MD. Schedule an appointment as soon as possible for a visit.   Specialty:  Internal Medicine Contact information: [place] [address] [phone]        Edrick Oh, MD. Schedule an appointment as soon as possible for a visit.   Specialty:  Nephrology Why:  You will be called for an appointment. Follow-up on kidneys Contact information: [place] [address] [phone]        [name], MD. Go on 01/29/2018.   Specialty:  Oncology Why:  8:00AM Contact information: [place] [address] [phone]          No Known Allergies  Consultations:  Oncology  Nephrology (telephone)   Procedures/Studies: Dg Chest 2 View  Result Date: 01/23/2018 CLINICAL DATA:  Generalized weakness. 30 pound weight loss. Hypercalcemia. EXAM: CHEST - 2 VIEW COMPARISON:  None. FINDINGS: Low lung volumes. Normal heart size and mediastinal contours with mild aortic atherosclerosis. Streaky bibasilar opacities are likely atelectasis. No evidence of focal pulmonary mass. No pulmonary edema, pleural effusion or pneumothorax. No acute osseous abnormalities are seen.  IMPRESSION: Low lung volumes with bibasilar atelectasis. Electronically Signed   By: Jeb Levering M.D.   On: 01/23/2018 23:35   Dg Lumbar Spine Complete  Result Date: 01/23/2018 CLINICAL DATA:  30 pound weight loss. Hypercalcemia. Generalized weakness and back pain. EXAM: LUMBAR SPINE - COMPLETE 4+ VIEW COMPARISON:  None. FINDINGS: L1 compression fracture with approximately 40% loss of height anteriorly. No posterior cortex involvement. Remaining lumbar vertebral body heights are maintained. Diffuse endplate spurring with mild diffuse disc space narrowing. The alignment is maintained. Sacroiliac joints are congruent. No radiographic evidence of focal bone lesion. IMPRESSION: 1. L1 compression fracture with 40% loss of height anteriorly, age indeterminate. 2. Mild diffuse spondylosis with endplate spurring and diffuse disc space narrowing. Electronically Signed   By: Jeb Levering M.D.   On: 01/23/2018 23:37   Mr Thoracic Spine Wo Contrast  Result Date: 01/24/2018 CLINICAL DATA:  57 year old male with unexplained 30 lb weight loss, fatigue. Back pain since December, which recently became severe with back spasm. Hypercalcemia. L1 compression fracture on  lumbar radiographs yesterday. IV contrast was deferred at this time due to renal insufficiency (estimated GFR 25). EXAM: MRI THORACIC AND LUMBAR SPINE WITHOUT CONTRAST TECHNIQUE: Multiplanar and multiecho pulse sequences of the thoracic and lumbar spine were obtained without intravenous contrast. COMPARISON:  Chest and lumbar radiographs 01/23/2018. FINDINGS: MRI THORACIC SPINE FINDINGS Limited cervical spine imaging: Diffusely abnormal bone marrow signal throughout the cervical spine, vertebral and posterior element involvement. Possible C6 ventral epidural extension of the abnormality with mild C6-C7 spinal stenosis suspected (series 19001, image 6). Similar abnormal marrow signal at the skull base. Abnormal marrow signal in the visible sternum and  manubrium. Thoracic spine segmentation:  Normal. Alignment: Preserved thoracic vertebral height and kyphosis. Borderline to mild loss of mid and lower thoracic vertebral height such as T7 and T8. Vertebrae: Diffusely abnormal marrow signal throughout the thoracic vertebrae including vertebral body, pedicle, posterior element involvement, and bilateral rib involvement. Abnormally expanded left T4 pedicle with early extension of the abnormality and to the left ventral epidural space and the left T4 neural foramen (series 8001, image 13). More moderate ventral epidural extension of tumor at the T8 and T9 levels (images 25 and 28). The appearance is more pronounced at T9 in greater on the right. But there is no significant spinal stenosis. However, there is moderate involvement of the right T9 neural foramen which appears moderately to severely narrow. Mild-to-moderate ventral epidural extension at T11. Moderate left T11 neural foraminal involvement and mild to moderate foraminal stenosis. Similar epidural and right foraminal involvement at T12, with moderate to severe right T12 foraminal stenosis. Cord: Thoracic spinal cord signal and morphology remains normal at all levels. There is borderline to Mild spinal stenosis at T9 (series 23001, image 28). The conus medullaris is normal at T12-L1. Paraspinal and other soft tissues: Intermittent wrap artifact on axial images. Negative visible thoracic and upper abdominal viscera. The posterior paraspinal soft tissues remain within normal limits. Disc levels: No significant disc degeneration. MRI LUMBAR SPINE FINDINGS Segmentation: Normal, concordant with the thoracic spine numbering today. Alignment:  Stable alignment.  No spondylolisthesis. Vertebrae: Diffusely abnormal marrow signal throughout the visible lumbosacral spine and pelvis in keeping with the cervical and thoracic findings. Moderate compression fracture of L1 with central loss of vertebral body height up to 52%.  Early ventral epidural extension of tumor on the left side (series 4001, image 9). Early left foraminal involvement without definite foraminal stenosis. Similar early ventral epidural and foraminal involvement on the left at L2. No stenosis. Mild compression fracture of the right lateral L3 vertebral body best demonstrated on series 6001, image 13. Early right L3 neural foraminal involvement without stenosis. Early ventral epidural tumor involvement at L4. No malignant stenosis at that level, although there is combined mild to moderate right L4 foraminal stenosis which is in part related to degenerative facet hypertrophy. Early ventral epidural involvement at L5.  No stenosis. Diffuse visible sacral and pelvic involvement with probable ventral presacral space extension of tumor. Conus medullaris and cauda equina: Conus extends to the T12-L1 level. No thickening of the cauda equina nerve roots. Paraspinal and other soft tissues: Moderate edema in the medial right psoas muscle from L2-L3 to the L5 level. Patchy edema in the medial erector spinae muscles at the L3 through L5 spinous process levels (series 6001, image 1). Negative visible abdominal viscera. No retroperitoneal lymphadenopathy. Disc levels: No significant disc degeneration. IMPRESSION: 1. Diffuse malignant infiltration of the bone marrow throughout the visible skeleton including the skull base, cervical through sacral  spine, visible pelvis and thorax. Top differential considerations include lymphoma, multiple myeloma, and widespread metastatic disease. 2. Multilevel tumor extension into the epidural space, although generally mild. There is borderline to mild malignant spinal stenosis suspected at both C6 and T9. No spinal cord mass effect or signal abnormality. 3. Multilevel tumor extension into the neural foramina: Left T4, right T9, left T11, right T12, right L3 nerve levels. 4. Pathologic compression fractures of L1 (50%) and the right L3 (mild)  vertebral bodies. Minimal to mild loss of midthoracic vertebral body height. 5. Reactive medial right psoas muscle and medial lumbar erector spinae muscle edema. 6. Negative partially visible thoracic and abdominal viscera. Electronically Signed   By: Genevie Ann M.D.   On: 01/24/2018 10:32   Mr Lumbar Spine Wo Contrast  Result Date: 01/24/2018 CLINICAL DATA:  57 year old male with unexplained 30 lb weight loss, fatigue. Back pain since December, which recently became severe with back spasm. Hypercalcemia. L1 compression fracture on lumbar radiographs yesterday. IV contrast was deferred at this time due to renal insufficiency (estimated GFR 25). EXAM: MRI THORACIC AND LUMBAR SPINE WITHOUT CONTRAST TECHNIQUE: Multiplanar and multiecho pulse sequences of the thoracic and lumbar spine were obtained without intravenous contrast. COMPARISON:  Chest and lumbar radiographs 01/23/2018. FINDINGS: MRI THORACIC SPINE FINDINGS Limited cervical spine imaging: Diffusely abnormal bone marrow signal throughout the cervical spine, vertebral and posterior element involvement. Possible C6 ventral epidural extension of the abnormality with mild C6-C7 spinal stenosis suspected (series 19001, image 6). Similar abnormal marrow signal at the skull base. Abnormal marrow signal in the visible sternum and manubrium. Thoracic spine segmentation:  Normal. Alignment: Preserved thoracic vertebral height and kyphosis. Borderline to mild loss of mid and lower thoracic vertebral height such as T7 and T8. Vertebrae: Diffusely abnormal marrow signal throughout the thoracic vertebrae including vertebral body, pedicle, posterior element involvement, and bilateral rib involvement. Abnormally expanded left T4 pedicle with early extension of the abnormality and to the left ventral epidural space and the left T4 neural foramen (series 8001, image 13). More moderate ventral epidural extension of tumor at the T8 and T9 levels (images 25 and 28). The  appearance is more pronounced at T9 in greater on the right. But there is no significant spinal stenosis. However, there is moderate involvement of the right T9 neural foramen which appears moderately to severely narrow. Mild-to-moderate ventral epidural extension at T11. Moderate left T11 neural foraminal involvement and mild to moderate foraminal stenosis. Similar epidural and right foraminal involvement at T12, with moderate to severe right T12 foraminal stenosis. Cord: Thoracic spinal cord signal and morphology remains normal at all levels. There is borderline to Mild spinal stenosis at T9 (series 23001, image 28). The conus medullaris is normal at T12-L1. Paraspinal and other soft tissues: Intermittent wrap artifact on axial images. Negative visible thoracic and upper abdominal viscera. The posterior paraspinal soft tissues remain within normal limits. Disc levels: No significant disc degeneration. MRI LUMBAR SPINE FINDINGS Segmentation: Normal, concordant with the thoracic spine numbering today. Alignment:  Stable alignment.  No spondylolisthesis. Vertebrae: Diffusely abnormal marrow signal throughout the visible lumbosacral spine and pelvis in keeping with the cervical and thoracic findings. Moderate compression fracture of L1 with central loss of vertebral body height up to 52%. Early ventral epidural extension of tumor on the left side (series 4001, image 9). Early left foraminal involvement without definite foraminal stenosis. Similar early ventral epidural and foraminal involvement on the left at L2. No stenosis. Mild compression fracture of the right  lateral L3 vertebral body best demonstrated on series 6001, image 13. Early right L3 neural foraminal involvement without stenosis. Early ventral epidural tumor involvement at L4. No malignant stenosis at that level, although there is combined mild to moderate right L4 foraminal stenosis which is in part related to degenerative facet hypertrophy. Early  ventral epidural involvement at L5.  No stenosis. Diffuse visible sacral and pelvic involvement with probable ventral presacral space extension of tumor. Conus medullaris and cauda equina: Conus extends to the T12-L1 level. No thickening of the cauda equina nerve roots. Paraspinal and other soft tissues: Moderate edema in the medial right psoas muscle from L2-L3 to the L5 level. Patchy edema in the medial erector spinae muscles at the L3 through L5 spinous process levels (series 6001, image 1). Negative visible abdominal viscera. No retroperitoneal lymphadenopathy. Disc levels: No significant disc degeneration. IMPRESSION: 1. 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 Top differential considerations include lymphoma, multiple myeloma, and widespread metastatic disease. 2. Multilevel tumor extension into the epidural space, although generally mild. There is borderline to mild malignant spinal stenosis suspected at both C6 and T9. No spinal cord mass effect or signal abnormality. 3. Multilevel tumor extension into the neural foramina: Left T4, right T9, left T11, right T12, right L3 nerve levels. 4. Pathologic compression fractures of L1 (50%) and the right L3 (mild) vertebral bodies. Minimal to mild loss of midthoracic vertebral body height. 5. Reactive medial right psoas muscle and medial lumbar erector spinae muscle edema. 6. Negative partially visible thoracic and abdominal viscera. Electronically Signed   By: Genevie Ann M.D.   On: 01/24/2018 10:32   Ct Bone Marrow Biopsy & Aspiration  Result Date: 01/25/2018 INDICATION: 57 year old with pancytopenia and concern for myeloma. Multiple bone lesions. EXAM: CT GUIDED BONE MARROW ASPIRATES AND BIOPSY Physician: Stephan Minister. Anselm Pancoast, MD MEDICATIONS: None. ANESTHESIA/SEDATION: Fentanyl 150 mcg IV; Versed 2.0 mg IV Moderate Sedation Time:  20 minutes The  patient was continuously monitored during the procedure by the interventional radiology nurse under my direct supervision. COMPLICATIONS: None immediate. PROCEDURE: The procedure was explained to the patient. The risks and benefits of the procedure were discussed and the patient's questions were addressed. Informed consent was obtained from the patient. The patient was placed prone on CT table. Images of the pelvis were obtained. The left side of back was prepped and draped in sterile fashion. The skin and left posterior ilium were anesthetized with 1% lidocaine. 11 gauge bone needle was directed into the left ilium with CT guidance. Two aspirates and 3 core biopsies were performed. Minimal fluid could be collected for aspiration. Two adequate core biopsies were obtained. Bandage placed over the puncture site. FINDINGS: Numerous lytic lesions scattered throughout the pelvic bones. In particular, there is an expansile partially destructive lesion involving the posterior right ilium measuring up to 3.2 cm. IMPRESSION: CT guided bone marrow aspiration and core biopsy. Multiple lytic bone lesions. Imaging findings are suggestive for metastatic disease or myeloma. Electronically Signed   By: Markus Daft M.D.   On: 01/25/2018 10:53      Subjective: No issues overnight.  Discharge Exam: Vitals:   01/25/18 2046 01/26/18 0521  BP: 133/76 123/76  Pulse: 87 76  Resp: 16 16  Temp: 98.2 F (36.8 C) 97.6 F (36.4 C)  SpO2: 96% 95%   Vitals:   01/25/18 1113 01/25/18 1623 01/25/18 2046 01/26/18 0521  BP: 134/74  131/81 133/76 123/76  Pulse: 74 86 87 76  Resp: '14  16 16  ' Temp: (!) 97.2 F (36.2 C) 98.2 F (36.8 C) 98.2 F (36.8 C) 97.6 F (36.4 C)  TempSrc: Oral Oral Oral Oral  SpO2: 98% 98% 96% 95%  Weight:    83.7 kg (184 lb 8 oz)  Height:        General: Pt is alert, awake, not in acute distress   The results of significant diagnostics from this hospitalization (including imaging, microbiology,  ancillary and laboratory) are listed below for reference.     Microbiology: No results found for this or any previous visit (from the past 240 hour(s)).   Labs: BNP (last 3 results) No results for input(s): BNP in the last 8760 hours. Basic Metabolic Panel: Recent Labs  Lab 01/23/18 1843 01/24/18 0109 01/24/18 0327  01/24/18 1351 01/24/18 2132 01/25/18 0611 01/25/18 1456 01/26/18 0246  NA 137 138 137   < > 139 137 139 138 137  K 3.8 3.1* 3.3*   < > 3.2* 3.3* 3.8 3.2* 4.1  CL 94* 99* 100*   < > 103 102 104 104 104  CO2 '30 29 28   ' < > '29 26 24 22 23  ' GLUCOSE 101* 108* 111*   < > 110* 107* 107* 113* 134*  BUN 51* 53* 53*   < > 53* 52* 46* 45* 53*  CREATININE 2.62* 2.60* 2.67*   < > 2.79* 2.72* 2.67* 2.47* 2.32*  CALCIUM >13.0* 14.2* 13.3*   < > 12.8* 12.2* 12.5* 11.5* 10.2  MG 2.2  --  1.8  --   --   --   --   --   --   PHOS  --  2.9 3.5  --   --   --   --   --   --    < > = values in this interval not displayed.   Liver Function Tests: Recent Labs  Lab 01/23/18 1843 01/24/18 0327  AST 46* 36  ALT 32 26  ALKPHOS 91 71  BILITOT 0.8 0.7  PROT 6.8 5.6*  ALBUMIN 4.1 3.2*   Recent Labs  Lab 01/23/18 1843  LIPASE 31   No results for input(s): AMMONIA in the last 168 hours. CBC: Recent Labs  Lab 01/23/18 1843 01/24/18 0109 01/24/18 0327 01/25/18 0611  WBC 4.8 3.4* 3.5* 4.1  NEUTROABS  --  2.2  --  2.6  HGB 11.0* 9.9* 9.1* 10.8*  HCT 32.2* 28.9* 26.9* 31.6*  MCV 95.0 94.1 94.1 94.9  PLT 144* 110* 117* 143*   Cardiac Enzymes: Recent Labs  Lab 01/24/18 0109  CKTOTAL 134   BNP: Invalid input(s): POCBNP CBG: Recent Labs  Lab 01/23/18 1856  GLUCAP 91   D-Dimer No results for input(s): DDIMER in the last 72 hours. Hgb A1c No results for input(s): HGBA1C in the last 72 hours. Lipid Profile No results for input(s): CHOL, HDL, LDLCALC, TRIG, CHOLHDL, LDLDIRECT in the last 72 hours. Thyroid function studies Recent Labs    01/24/18 0109  TSH 1.019    Anemia work up Recent Labs    01/24/18 0327  VITAMINB12 186  FOLATE 9.3  FERRITIN 1,214*  TIBC 218*  IRON 70  RETICCTPCT 1.9   Urinalysis    Component Value Date/Time   COLORURINE YELLOW 01/23/2018 1903   APPEARANCEUR HAZY (A) 01/23/2018 1903   LABSPEC 1.015 01/23/2018 1903   PHURINE 5.0 01/23/2018 1903   GLUCOSEU NEGATIVE 01/23/2018 1903  HGBUR SMALL (A) 01/23/2018 1903   BILIRUBINUR NEGATIVE 01/23/2018 1903   KETONESUR NEGATIVE 01/23/2018 1903   PROTEINUR NEGATIVE 01/23/2018 1903   NITRITE NEGATIVE 01/23/2018 1903   LEUKOCYTESUR NEGATIVE 01/23/2018 1903     SIGNED:   Cordelia Poche, MD [facility] 01/26/2018, 8:35 AM Pager [phone]  If 7PM-7AM, please contact night-coverage [email or web address] Password [email or web address]

## 2018-01-28 ENCOUNTER — Other Ambulatory Visit: Payer: BLUE CROSS/BLUE SHIELD

## 2018-01-28 ENCOUNTER — Ambulatory Visit: Payer: BLUE CROSS/BLUE SHIELD | Admitting: Oncology

## 2018-01-28 ENCOUNTER — Ambulatory Visit: Payer: BLUE CROSS/BLUE SHIELD

## 2018-01-28 ENCOUNTER — Telehealth: Payer: Self-pay

## 2018-01-28 ENCOUNTER — Other Ambulatory Visit: Payer: Self-pay | Admitting: Oncology

## 2018-01-28 DIAGNOSIS — C9000 Multiple myeloma not having achieved remission: Secondary | ICD-10-CM | POA: Insufficient documentation

## 2018-01-28 NOTE — Telephone Encounter (Signed)
Called to inform of updated appts. Pt voiced understanding.

## 2018-01-28 NOTE — Progress Notes (Signed)
START ON PATHWAY REGIMEN - Multiple Myeloma and Other Plasma Cell Dyscrasias     A cycle is every 21 days:     Bortezomib      Cyclophosphamide      Dexamethasone   **Always confirm dose/schedule in your pharmacy ordering system**    Patient Characteristics: Newly Diagnosed, Transplant Eligible, Unknown or Awaiting Test Results R-ISS Staging: Not Applicable Disease Classification: Newly Diagnosed Is Patient Eligible for Transplant<= Transplant Eligible Risk Status: Awaiting Test Results Intent of Therapy: Non-Curative / Palliative Intent, Discussed with Patient

## 2018-01-28 NOTE — Telephone Encounter (Signed)
Returned call to pt wife. Wife inquiring about results of recent blood work. She states the pt is in pain and not feeling well. Gave him a dose of his prescribed steroids and "he is feeling better". Wife mentioned interest in [facility] referral. Made MD aware. Per Benay Spice he will call and speak with pt and wife. Pt and wife voiced understanding and will call back with lab work drawn today at a different office.

## 2018-01-29 ENCOUNTER — Ambulatory Visit: Payer: BLUE CROSS/BLUE SHIELD

## 2018-01-29 ENCOUNTER — Telehealth: Payer: Self-pay | Admitting: Pharmacist

## 2018-01-29 ENCOUNTER — Inpatient Hospital Stay: Payer: BLUE CROSS/BLUE SHIELD

## 2018-01-29 ENCOUNTER — Telehealth: Payer: Self-pay | Admitting: Nurse Practitioner

## 2018-01-29 ENCOUNTER — Other Ambulatory Visit: Payer: BLUE CROSS/BLUE SHIELD

## 2018-01-29 ENCOUNTER — Inpatient Hospital Stay: Payer: BLUE CROSS/BLUE SHIELD | Attending: Oncology | Admitting: Nurse Practitioner

## 2018-01-29 ENCOUNTER — Telehealth: Payer: Self-pay | Admitting: Pharmacy Technician

## 2018-01-29 ENCOUNTER — Encounter: Payer: Self-pay | Admitting: Nurse Practitioner

## 2018-01-29 VITALS — BP 132/71 | HR 93 | Temp 98.0°F | Resp 16

## 2018-01-29 VITALS — BP 132/83 | HR 61 | Temp 97.5°F | Resp 18 | Ht 66.0 in | Wt 188.5 lb

## 2018-01-29 DIAGNOSIS — R634 Abnormal weight loss: Secondary | ICD-10-CM

## 2018-01-29 DIAGNOSIS — D696 Thrombocytopenia, unspecified: Secondary | ICD-10-CM | POA: Insufficient documentation

## 2018-01-29 DIAGNOSIS — D649 Anemia, unspecified: Secondary | ICD-10-CM | POA: Diagnosis not present

## 2018-01-29 DIAGNOSIS — G893 Neoplasm related pain (acute) (chronic): Secondary | ICD-10-CM | POA: Diagnosis not present

## 2018-01-29 DIAGNOSIS — N19 Unspecified kidney failure: Secondary | ICD-10-CM | POA: Insufficient documentation

## 2018-01-29 DIAGNOSIS — R600 Localized edema: Secondary | ICD-10-CM | POA: Insufficient documentation

## 2018-01-29 DIAGNOSIS — C9000 Multiple myeloma not having achieved remission: Secondary | ICD-10-CM

## 2018-01-29 DIAGNOSIS — Z79899 Other long term (current) drug therapy: Secondary | ICD-10-CM | POA: Diagnosis not present

## 2018-01-29 DIAGNOSIS — M4856XS Collapsed vertebra, not elsewhere classified, lumbar region, sequela of fracture: Secondary | ICD-10-CM | POA: Diagnosis not present

## 2018-01-29 DIAGNOSIS — Z5111 Encounter for antineoplastic chemotherapy: Secondary | ICD-10-CM | POA: Insufficient documentation

## 2018-01-29 DIAGNOSIS — M549 Dorsalgia, unspecified: Secondary | ICD-10-CM | POA: Diagnosis not present

## 2018-01-29 DIAGNOSIS — Z7189 Other specified counseling: Secondary | ICD-10-CM | POA: Insufficient documentation

## 2018-01-29 LAB — PARATHYROID HORMONE, INTACT (NO CA)

## 2018-01-29 LAB — IMMUNOFIXATION ELECTROPHORESIS
IgA: 22 mg/dL — ABNORMAL LOW (ref 90–386)
IgG (Immunoglobin G), Serum: 518 mg/dL — ABNORMAL LOW (ref 700–1600)
IgM (Immunoglobulin M), Srm: 25 mg/dL (ref 20–172)
TOTAL PROTEIN ELP: 5.6 g/dL — AB (ref 6.0–8.5)

## 2018-01-29 MED ORDER — PROCHLORPERAZINE MALEATE 10 MG PO TABS
10.0000 mg | ORAL_TABLET | Freq: Once | ORAL | Status: AC
  Administered 2018-01-29: 10 mg via ORAL

## 2018-01-29 MED ORDER — BORTEZOMIB CHEMO SQ INJECTION 3.5 MG (2.5MG/ML)
1.3000 mg/m2 | Freq: Once | INTRAMUSCULAR | Status: AC
  Administered 2018-01-29: 2.5 mg via SUBCUTANEOUS
  Filled 2018-01-29: qty 2.5

## 2018-01-29 MED ORDER — ACYCLOVIR 400 MG PO TABS: 400.0000 mg | ORAL_TABLET | Freq: Every day | ORAL | 3 refills | Status: DC

## 2018-01-29 MED ORDER — PROCHLORPERAZINE MALEATE 10 MG PO TABS
ORAL_TABLET | ORAL | Status: AC
  Filled 2018-01-29: qty 1

## 2018-01-29 NOTE — Progress Notes (Signed)
Ok to treat per Dr. Benay Spice with SCr 2.3

## 2018-01-29 NOTE — Progress Notes (Signed)
[place] OFFICE PROGRESS NOTE   Diagnosis: Anemia/thrombocytopenia, hypercalcemia  INTERVAL HISTORY:   [name] returns for first outpatient follow-up since hospital discharge.  He continues to have back pain.  He does not take oxycodone.  He notes some relief with Robaxin.  He continues to have leg edema.  He also notes edema at the waist and involving the testicles.  The edema is better today.  He notes improvement in his appetite.  He has upper anterior bilateral rib pain today.  Objective:  Vital signs in last 24 hours:  Blood pressure 132/83, pulse 61, temperature (!) 97.5 F (36.4 C), temperature source Oral, resp. rate 18, height '5\' 6"'  (1.676 m), weight 188 lb 8 oz (85.5 kg), SpO2 98 %.    HEENT: No thrush. Resp: Lungs clear bilaterally. Cardio: Regular rate and rhythm. GI: Abdomen soft and nontender. Vascular: Pitting edema at the lower legs bilaterally. Neuro: Alert and oriented.    Lab Results:  Lab Results  Component Value Date   WBC 4.1 01/25/2018   HGB 10.8 (L) 01/25/2018   HCT 31.6 (L) 01/25/2018   MCV 94.9 01/25/2018   PLT 143 (L) 01/25/2018   NEUTROABS 2.6 01/25/2018    Imaging:  No results found.  Medications: I have reviewed the patient's current medications.  Assessment/Plan: 1. Anemia/thrombocytopenia status post bone marrow biopsy 01/25/2018; 01/24/2018 SPEP with no M spike, elevated serum lambda free light chains (432)  Dexamethasone 40 mg daily times 4 days beginning 01/25/2018  Velcade 01/29/2018 2. Hypercalcemia status post pamidronate 01/24/2018 3. Diffuse abnormal bone marrow signal, compression fracture of L1 4. Back pain secondary to multiple myeloma, compression fracture  5. Anorexia/weight loss 6. Renal failure.  Improved 01/29/2018  Disposition: Mr. Ralph appears stable.  The final bone marrow report is pending.  Dr. Benay Spice spoke with Dr. Gari Crown who confirms a diagnosis of multiple myeloma.  Dr. Benay Spice  recommends initiating treatment with Revlimid/Velcade/dexamethasone (he completed a 4-day course of dexamethasone 40 mg daily beginning 01/25/2018).  We reviewed potential toxicities associated with this regimen.  He is attending a chemotherapy education class today.  The [facility] pharmacist will meet with him today as well.  He will take the Revlimid 14 days on/7 days off; Velcade/dexamethasone weekly x3 followed by a 1 week break.  We discussed the increased risk of blood clots on this regimen.  He will continue aspirin 81 mg daily.  We also discussed the need for acyclovir prophylaxis and will send a prescription to his pharmacy (dose adjusted due to renal failure).  He will receive the first Velcade today.    Dr. Benay Spice also recommends monthly Zometa.  We reviewed potential toxicities including fever/flulike symptoms, osteonecrosis of the jaw.  He reports he is up-to-date on dental visits.  He will make his dentist aware that he is receiving Zometa.    We will see him in follow-up in 1 week.  He will contact the office in the interim with any problems.  Patient seen with Dr. Benay Spice.  25 minutes were spent face-to-face at today's visit with the majority of that time involved in counseling/coordination of care.  Ned Card ANP/GNP-BC   01/29/2018  12:06 PM  This was insured visit with Ned Card.  The bone marrow biopsy confirms a diagnosis of multiple myeloma.  I discussed the bone marrow findings with Dr. Gari Crown.  The bone marrow is extensively involved with malignant cells which appear anaplastic.  Immunohistochemical stains are consistent with involvement by plasma cells. The  serum and urine lambda light chains are elevated and serum immunofixation confirms monoclonal lambda free light chains.  We discussed the diagnosis of myeloma, prognosis, and treatment options with Mr. Kurtzman and his wife.  I recommend induction therapy with RVD.  We will refer him to a transplant center to  consider stem cell therapy.  We reviewed the potential toxicities associated with Revlimid, Velcade, and Decadron.  He agrees to proceed.  He met with the Cancer center pharmacist today.  He completed 4 days of pulse Decadron last week in the setting of hypercalcemia and renal failure.  He will begin Velcade therapy today.  He will receive another dose of Decadron with the day 8 Velcade.  Mr. Witherington will begin aspirin and acyclovir prophylaxis.  Julieanne Manson, MD

## 2018-01-29 NOTE — Patient Instructions (Signed)
[facility] Discharge Instructions for Patients Receiving Chemotherapy  Today you received the following chemotherapy agents velcade   To help prevent nausea and vomiting after your treatment, we encourage you to take your nausea medication as directed  If you develop nausea and vomiting that is not controlled by your nausea medication, call the clinic.   BELOW ARE SYMPTOMS THAT SHOULD BE REPORTED IMMEDIATELY:  *FEVER GREATER THAN 100.5 F  *CHILLS WITH OR WITHOUT FEVER  NAUSEA AND VOMITING THAT IS NOT CONTROLLED WITH YOUR NAUSEA MEDICATION  *UNUSUAL SHORTNESS OF BREATH  *UNUSUAL BRUISING OR BLEEDING  TENDERNESS IN MOUTH AND THROAT WITH OR WITHOUT PRESENCE OF ULCERS  *URINARY PROBLEMS  *BOWEL PROBLEMS  UNUSUAL RASH Items with * indicate a potential emergency and should be followed up as soon as possible.  Feel free to call the clinic you have any questions or concerns. The clinic phone number is [phone].  

## 2018-01-29 NOTE — Telephone Encounter (Signed)
Oral Chemotherapy Pharmacist Encounter   I spoke with patient and wife in exam room for overview of: Revlimid.   Counseled patient on administration, dosing, side effects, monitoring, drug-food interactions, safe handling, storage, and disposal.  Patient will take Revlimid 10 mg capsules, 1 capsule by mouth once daily, without regard to food, with a full glass of water. Revlimid will be given 14 days on, 7 days off, for cycle 1.  If cycle 1 is tolerated, patient will take Revlimid 10 mg capsules, 1 capsule by mouth once daily for 21 days on, 7 days off, repeat every 28 days.  Revlimid dose may be increased in the future based on improvement of renal function.  Patient will take dexamethasone 4mg  tablets, 10 tablets (40 mg) by mouth once weekly with breakfast.  Velcade will be given at 1.3 mg/m once weekly for 3 weeks on, one-week off, repeat every 4 weeks.  Revlimid start date: TBD, pending medication acquisition  Side effects of Revlimid include but not limited to: nausea, constipation, diarrhea, abdominal pain, rash, fatigue, drug fever, and decreased blood counts.    Reviewed with patient importance of keeping a medication schedule and plan for any missed doses.  Mr. and Mrs. Charney voiced understanding and appreciation.   All questions answered. Medication reconciliation performed and medication/allergy list updated.  Acyclovir prescription sent today to local pharmacy.  Patient knows to start this as soon as possible. Acyclovir dose reduced to once daily due to renal dysfunction.  Dose may increase in the future based upon improvement in renal function. Patient counseled on importance of daily aspirin 81mg  for thromboprophylaxis.  Patient has already started on this medication.  Insurance authorization was required, was submitted, and is approved. Prescription will be sent to appropriate specialty pharmacy for dispensing once [employer] authorization is obtained from  collaborative practice RN.  Patient knows to call the office with questions or concerns. Oral Oncology Clinic will continue to follow.  Johny Drilling, PharmD, BCPS, BCOP 01/29/2018    3:51 PM Oral Oncology Clinic [phone]

## 2018-01-29 NOTE — Telephone Encounter (Signed)
Oral Oncology Patient Advocate Encounter  Received notification from [facility] that prior authorization for Revlimid is required.  PA submitted on CoverMyMeds Key [id number] Status is pending  Oral Oncology Clinic will continue to follow.  Fabio Asa. Melynda Keller, [place] Patient [place] [phone] 01/29/2018 2:48 PM

## 2018-01-29 NOTE — Telephone Encounter (Signed)
Oral Oncology Pharmacist Encounter  Received new prescription for Revlimid (lenalidomide) for the treatment of newly diagnosed multiple myeloma in conjunction with dexamethasone and Velcade, planned duration 4-6 cycles and reassessment.  Labs from Epic assessed, okay for treatment. Noted SCr=2.48, est CrCl ~ 40 mL/min, Revlimid will be dosed at 10 mg once daily per manufacturer recommendation for CrCl 30-60 mL/min  Current medication list in Epic reviewed, no DDIs with Revlimid identified. Notification on daily aspirin for thromboprophylaxis with the use of Revlimid and dexamethasone. Acyclovir will be added to patient's medication regimen for VZV prophylaxis due to proteosome inhibition.   Medication will be appropriately dose reduced for renal function.  Prescription will be e-scribed to [place] for benefits analysis and approval once [employer] authorization is obtained as Revlimid is a limited distribution medication.  Oral Oncology Clinic will continue to follow for insurance authorization, copayment issues, initial counseling and start date.  Johny Drilling, PharmD, BCPS, BCOP 01/29/2018 2:50 PM Oral Oncology Clinic [phone]

## 2018-01-29 NOTE — Telephone Encounter (Signed)
Oral Oncology Patient Advocate Encounter  Prior Authorization for Revlimid has been approved.    Effective dates: 01/29/2018 through 01/28/2019  Oral Oncology Clinic will continue to follow.   Fabio Asa. Melynda Keller, [place] Patient [place] [phone] 01/29/2018 2:49 PM

## 2018-01-29 NOTE — Telephone Encounter (Signed)
Scheduled appt per 3/26 los - left message with appt date and time and sent reminder letter in the mail with appt date and time.

## 2018-01-30 ENCOUNTER — Telehealth: Payer: Self-pay

## 2018-01-30 DIAGNOSIS — C9000 Multiple myeloma not having achieved remission: Secondary | ICD-10-CM

## 2018-01-30 MED ORDER — LENALIDOMIDE 10 MG PO CAPS: 10.0000 mg | ORAL_CAPSULE | Freq: Every day | ORAL | 0 refills | Status: DC

## 2018-01-30 NOTE — Telephone Encounter (Signed)
Oral Oncology Pharmacist Encounter  Received Revlimid prescription was [employer] authorization number from collaborative practice RN.  Revlimid prescription E scribed to [facility] for dispensing as Revlimid is a limited distribution medication. Supporting information faxed to pharmacy.  We will follow-up with pharmacy tomorrow to ensure they are in network with patient's prescription insurance coverage and they are able to fill patient's Revlimid prescription.  Patient will be updated about dispensing pharmacy and provided pharmacy's phone number once this is confirmed.  Oral Oncology Clinic will continue to follow.  Johny Drilling, PharmD, BCPS, BCOP 01/30/2018 5:00 PM Oral Oncology Clinic [phone]

## 2018-01-30 NOTE — Telephone Encounter (Signed)
Pt called to ask about medications. Pt states his prescribed narcotic and muscle relaxer aren't relieveing pain, "I took one dose of the oxycodone and it put me to sleep, just didn't help much with pain". Also wondering if "there is something I can have for sleep". Per Lattie Haw NP pt to continue pain regimen with oxy and robaxin, pt can use benadryl for sleep and also heat/ice on back if it feels good. Pt voiced understanding.

## 2018-01-31 LAB — PTH-RELATED PEPTIDE

## 2018-01-31 MED ORDER — LENALIDOMIDE 10 MG PO CAPS: 10.0000 mg | ORAL_CAPSULE | Freq: Every day | ORAL | 0 refills | Status: DC

## 2018-01-31 NOTE — Telephone Encounter (Signed)
Oral Oncology Pharmacist Encounter  Received notification from [facility] thatthey not in network with patient's prescription insurance coverage.  Revlimid prescription has been e- scribed to [facility] per insurance requirement (phone: [phone]).  I called and updated Mr. and Mrs. Laur about dispensing pharmacy and provided phone number to pharmacy. Patient instructed to reach out to dispensing pharmacy on Monday, 02/04/2018 if they have not heard from them by then.  Confirmed with patient that he has started on his daily acyclovir.  All questions answered.  Patient and wife expressed understanding and appreciation.  Patient knows to call the office with any additional questions or concerns.  Oral Oncology Clinic will continue to follow.  Johny Drilling, PharmD, BCPS, BCOP 01/31/2018 3:20 PM Oral Oncology Clinic [phone]

## 2018-02-01 ENCOUNTER — Telehealth: Payer: Self-pay | Admitting: *Deleted

## 2018-02-01 NOTE — Telephone Encounter (Signed)
-----   Message from Arty Baumgartner, RN sent at 01/29/2018  3:21 PM EDT ----- Regarding: First Chemo/[place]  First time chemo/velcade.  Tolerated well.  Dr. Benay Spice

## 2018-02-01 NOTE — Telephone Encounter (Signed)
TCT patient to follow up with him after his 1st velcade injections this week. Spoke with patient. He states he is feeling well. "So far so good". Denies any fever/chils/nausea/diarrhea/constipation. Eating well and drinking adequate fluids. He voiced appreciation for phone call. He is aware of his upcoming appts.

## 2018-02-03 ENCOUNTER — Other Ambulatory Visit: Payer: Self-pay | Admitting: Oncology

## 2018-02-05 ENCOUNTER — Inpatient Hospital Stay: Payer: BLUE CROSS/BLUE SHIELD

## 2018-02-05 ENCOUNTER — Inpatient Hospital Stay: Payer: BLUE CROSS/BLUE SHIELD | Attending: Oncology

## 2018-02-05 ENCOUNTER — Encounter: Payer: Self-pay | Admitting: Nurse Practitioner

## 2018-02-05 ENCOUNTER — Telehealth: Payer: Self-pay | Admitting: Nurse Practitioner

## 2018-02-05 ENCOUNTER — Inpatient Hospital Stay (HOSPITAL_BASED_OUTPATIENT_CLINIC_OR_DEPARTMENT_OTHER): Payer: BLUE CROSS/BLUE SHIELD | Admitting: Nurse Practitioner

## 2018-02-05 VITALS — BP 131/81 | HR 92 | Temp 98.3°F | Resp 19 | Ht 66.0 in | Wt 177.5 lb

## 2018-02-05 DIAGNOSIS — M62830 Muscle spasm of back: Secondary | ICD-10-CM | POA: Diagnosis not present

## 2018-02-05 DIAGNOSIS — D649 Anemia, unspecified: Secondary | ICD-10-CM

## 2018-02-05 DIAGNOSIS — J301 Allergic rhinitis due to pollen: Secondary | ICD-10-CM | POA: Insufficient documentation

## 2018-02-05 DIAGNOSIS — R600 Localized edema: Secondary | ICD-10-CM

## 2018-02-05 DIAGNOSIS — M549 Dorsalgia, unspecified: Secondary | ICD-10-CM | POA: Diagnosis not present

## 2018-02-05 DIAGNOSIS — R040 Epistaxis: Secondary | ICD-10-CM | POA: Insufficient documentation

## 2018-02-05 DIAGNOSIS — Z792 Long term (current) use of antibiotics: Secondary | ICD-10-CM | POA: Diagnosis not present

## 2018-02-05 DIAGNOSIS — D701 Agranulocytosis secondary to cancer chemotherapy: Secondary | ICD-10-CM | POA: Insufficient documentation

## 2018-02-05 DIAGNOSIS — R509 Fever, unspecified: Secondary | ICD-10-CM | POA: Diagnosis not present

## 2018-02-05 DIAGNOSIS — N19 Unspecified kidney failure: Secondary | ICD-10-CM | POA: Insufficient documentation

## 2018-02-05 DIAGNOSIS — R634 Abnormal weight loss: Secondary | ICD-10-CM

## 2018-02-05 DIAGNOSIS — R630 Anorexia: Secondary | ICD-10-CM | POA: Insufficient documentation

## 2018-02-05 DIAGNOSIS — C9000 Multiple myeloma not having achieved remission: Secondary | ICD-10-CM | POA: Diagnosis not present

## 2018-02-05 DIAGNOSIS — Z5111 Encounter for antineoplastic chemotherapy: Secondary | ICD-10-CM | POA: Diagnosis not present

## 2018-02-05 DIAGNOSIS — D696 Thrombocytopenia, unspecified: Secondary | ICD-10-CM | POA: Insufficient documentation

## 2018-02-05 DIAGNOSIS — G893 Neoplasm related pain (acute) (chronic): Secondary | ICD-10-CM | POA: Insufficient documentation

## 2018-02-05 DIAGNOSIS — R21 Rash and other nonspecific skin eruption: Secondary | ICD-10-CM | POA: Diagnosis not present

## 2018-02-05 DIAGNOSIS — R5383 Other fatigue: Secondary | ICD-10-CM | POA: Insufficient documentation

## 2018-02-05 DIAGNOSIS — M8448XS Pathological fracture, other site, sequela: Secondary | ICD-10-CM | POA: Insufficient documentation

## 2018-02-05 DIAGNOSIS — J069 Acute upper respiratory infection, unspecified: Secondary | ICD-10-CM | POA: Diagnosis not present

## 2018-02-05 DIAGNOSIS — L299 Pruritus, unspecified: Secondary | ICD-10-CM | POA: Diagnosis not present

## 2018-02-05 DIAGNOSIS — D709 Neutropenia, unspecified: Secondary | ICD-10-CM | POA: Diagnosis not present

## 2018-02-05 DIAGNOSIS — T451X5S Adverse effect of antineoplastic and immunosuppressive drugs, sequela: Secondary | ICD-10-CM | POA: Diagnosis not present

## 2018-02-05 DIAGNOSIS — Z79899 Other long term (current) drug therapy: Secondary | ICD-10-CM | POA: Insufficient documentation

## 2018-02-05 LAB — CBC WITH DIFFERENTIAL (CANCER CENTER ONLY)
BASOS ABS: 0 10*3/uL (ref 0.0–0.1)
BASOS PCT: 0 %
EOS ABS: 0 10*3/uL (ref 0.0–0.5)
Eosinophils Relative: 2 %
HEMATOCRIT: 26.2 % — AB (ref 38.4–49.9)
HEMOGLOBIN: 8.8 g/dL — AB (ref 13.0–17.1)
Lymphocytes Relative: 18 %
Lymphs Abs: 0.5 10*3/uL — ABNORMAL LOW (ref 0.9–3.3)
MCH: 32.7 pg (ref 27.2–33.4)
MCHC: 33.6 g/dL (ref 32.0–36.0)
MCV: 97.3 fL (ref 79.3–98.0)
Monocytes Absolute: 0.2 10*3/uL (ref 0.1–0.9)
Monocytes Relative: 7 %
NEUTROS ABS: 1.9 10*3/uL (ref 1.5–6.5)
Neutrophils Relative %: 73 %
Platelet Count: 127 10*3/uL — ABNORMAL LOW (ref 140–400)
RBC: 2.69 MIL/uL — AB (ref 4.20–5.82)
RDW: 15.1 % — ABNORMAL HIGH (ref 11.0–14.6)
WBC: 2.6 10*3/uL — AB (ref 4.0–10.3)

## 2018-02-05 LAB — CMP (CANCER CENTER ONLY)
ALBUMIN: 3.7 g/dL (ref 3.5–5.0)
ALK PHOS: 207 U/L — AB (ref 40–150)
ALT: 40 U/L (ref 0–55)
AST: 19 U/L (ref 5–34)
Anion gap: 8 (ref 3–11)
BILIRUBIN TOTAL: 0.4 mg/dL (ref 0.2–1.2)
BUN: 23 mg/dL (ref 7–26)
CALCIUM: 8.5 mg/dL (ref 8.4–10.4)
CO2: 25 mmol/L (ref 22–29)
CREATININE: 1.72 mg/dL — AB (ref 0.70–1.30)
Chloride: 108 mmol/L (ref 98–109)
GFR, Est AFR Am: 49 mL/min — ABNORMAL LOW (ref >60)
GFR, Estimated: 42 mL/min — ABNORMAL LOW (ref >60)
GLUCOSE: 101 mg/dL (ref 70–140)
Potassium: 4.1 mmol/L (ref 3.5–5.1)
SODIUM: 141 mmol/L (ref 136–145)
TOTAL PROTEIN: 6.4 g/dL (ref 6.4–8.3)

## 2018-02-05 MED ORDER — DEXAMETHASONE 4 MG PO TABS
ORAL_TABLET | ORAL | Status: AC
Start: 2018-02-05 — End: ?
  Filled 2018-02-05: qty 10

## 2018-02-05 MED ORDER — DEXAMETHASONE 4 MG PO TABS
40.0000 mg | ORAL_TABLET | ORAL | Status: DC
  Administered 2018-02-05: 40 mg via ORAL

## 2018-02-05 MED ORDER — PROCHLORPERAZINE MALEATE 10 MG PO TABS
ORAL_TABLET | ORAL | Status: AC
  Filled 2018-02-05: qty 1

## 2018-02-05 MED ORDER — PROCHLORPERAZINE MALEATE 10 MG PO TABS
10.0000 mg | ORAL_TABLET | Freq: Once | ORAL | Status: AC
  Administered 2018-02-05: 10 mg via ORAL

## 2018-02-05 MED ORDER — METHOCARBAMOL 500 MG PO TABS: 500.0000 mg | ORAL_TABLET | Freq: Three times a day (TID) | ORAL | 0 refills | Status: DC | PRN

## 2018-02-05 MED ORDER — BORTEZOMIB CHEMO SQ INJECTION 3.5 MG (2.5MG/ML)
1.3000 mg/m2 | Freq: Once | INTRAMUSCULAR | Status: AC
  Administered 2018-02-05: 2.5 mg via SUBCUTANEOUS
  Filled 2018-02-05: qty 2.5

## 2018-02-05 NOTE — Telephone Encounter (Signed)
Scheduled appt per 4/2 los - Gave patient AVS and calender per los.  

## 2018-02-05 NOTE — Patient Instructions (Signed)
[facility] Discharge Instructions for Patients Receiving Chemotherapy  Today you received the following chemotherapy agents Velcade To help prevent nausea and vomiting after your treatment, we encourage you to take your nausea medication as prescribed.   If you develop nausea and vomiting that is not controlled by your nausea medication, call the clinic.   BELOW ARE SYMPTOMS THAT SHOULD BE REPORTED IMMEDIATELY:  *FEVER GREATER THAN 100.5 F  *CHILLS WITH OR WITHOUT FEVER  NAUSEA AND VOMITING THAT IS NOT CONTROLLED WITH YOUR NAUSEA MEDICATION  *UNUSUAL SHORTNESS OF BREATH  *UNUSUAL BRUISING OR BLEEDING  TENDERNESS IN MOUTH AND THROAT WITH OR WITHOUT PRESENCE OF ULCERS  *URINARY PROBLEMS  *BOWEL PROBLEMS  UNUSUAL RASH Items with * indicate a potential emergency and should be followed up as soon as possible.  Feel free to call the clinic should you have any questions or concerns. The clinic phone number is [phone].  Please show the CHEMO ALERT CARD at check-in to the Emergency Department and triage nurse.   

## 2018-02-05 NOTE — Progress Notes (Signed)
Per Ned Card, NP / Dr Benay Spice OK to give Velcade with CRT of 1.72

## 2018-02-05 NOTE — Progress Notes (Addendum)
  [facility] OFFICE PROGRESS NOTE   Diagnosis: Multiple myeloma  INTERVAL HISTORY:   Paul Green returns as scheduled.  He completed cycle 1 week 1 Velcade 01/29/2018.  He denies nausea/vomiting.  No mouth sores.  No diarrhea or constipation.  He continues daily MiraLAX.  "Back spasms" have improved.  He attributes this to sleeping in a recliner the past few nights.  He continues Robaxin.  He expects to receive the first shipment of Revlimid this week.  Leg swelling is better.  Improved appetite.  Objective:  Vital signs in last 24 hours:  Blood pressure 131/81, pulse 92, temperature 98.3 F (36.8 C), temperature source Oral, resp. rate 19, height _0  (1.676 m), weight 177 lb 8 oz (80.5 kg), SpO2 100 %.    HEENT: No thrush or ulcers. Resp: Lungs clear bilaterally. Cardio: Regular rate and rhythm. GI: Abdomen soft and nontender.  No hepatomegaly. Vascular: Trace bilateral ankle edema.  Calves soft and nontender. Neuro: Alert and oriented. Skin: No rash.   Lab Results:  Lab Results  Component Value Date   WBC 2.6 (L) 02/05/2018   HGB 10.8 (L) 01/25/2018   HCT 26.2 (L) 02/05/2018   MCV 97.3 02/05/2018   PLT 127 (L) 02/05/2018   NEUTROABS 1.9 02/05/2018    Imaging:  No results found.  Medications: I have reviewed the patient's current medications.  Assessment/Plan: 1. Multiple myeloma   Anemia/thrombocytopenia  Bone marrow biopsy 01/25/2018-hypercellular bone marrow with extensive involvement by plasma cell neoplasm; flow cytometry with a population of cells with plasmacytic differentiation (13%), no monoclonal B-cell population identified, T cells with nonspecific changes.  01/24/2018 SPEP-no M spike, elevated serum lambda free light chains (432)  Dexamethasone 40 mg daily times 4 days beginning 01/25/2018  Velcade/dexamethasone weekly x3 followed by a 1 week break beginning 01/29/2018 (weekly dexamethasone initiated 02/05/2018) 2. Hypercalcemia status  post pamidronate 01/24/2018 3. Diffuse abnormal bone marrow signal, compression fracture of L1 4. Back pain secondary to multiple myeloma, compression fracture.  Improved. 5. Anorexia/weight loss 6. Renal failure.  Improved 01/29/2018, 02/05/2018.     Disposition: Paul Green appears stable.  Plan to proceed with cycle 1 week 2 Velcade/dexamethasone today as scheduled.  He will begin Revlimid later this week.  He will contact the office with a start date for Revlimid.  Plan for Zometa approximately 1 month post pamidronate which was given 01/24/2018.  We reviewed today's labs.  CBC is adequate for treatment.  Calcium remains in normal range.  Creatinine is better.  Back pain overall is better.  He will continue Robaxin as needed.  We will call a new prescription to his pharmacy.  He will return for lab, follow-up and Velcade/dexamethasone in 1 week.  He will contact the office in the interim with any problems.    Patient seen with Dr. Benay Spice.    Ned Card ANP/GNP-BC   02/05/2018  2:18 PM  This was a shared visit with Ned Card.  Paul Green has experienced significant improvement in pain over the past week.  He appears to be tolerating treatment well.  The plan is to continue cycle 1 Of RVD.  He will begin Revlimid later this week.  Julieanne Manson, MD

## 2018-02-06 ENCOUNTER — Other Ambulatory Visit: Payer: Self-pay | Admitting: Nurse Practitioner

## 2018-02-06 DIAGNOSIS — C9000 Multiple myeloma not having achieved remission: Secondary | ICD-10-CM

## 2018-02-07 ENCOUNTER — Telehealth: Payer: Self-pay | Admitting: *Deleted

## 2018-02-07 ENCOUNTER — Other Ambulatory Visit: Payer: Self-pay | Admitting: *Deleted

## 2018-02-07 DIAGNOSIS — C9000 Multiple myeloma not having achieved remission: Secondary | ICD-10-CM

## 2018-02-07 MED ORDER — METHOCARBAMOL 500 MG PO TABS: 500.0000 mg | ORAL_TABLET | Freq: Three times a day (TID) | ORAL | 0 refills | Status: DC | PRN

## 2018-02-07 NOTE — Telephone Encounter (Signed)
Received call from pt's wife stating they are having trouble getting Revlimid delivered.  She states script was sent to [facility] & she was on hold 2 hr & 30 min & was told they were waiting to hear back from MD office.  Called [facility] myself & was informed that script looked ok & not sure why there was a question.  This person said they would send it back through to pharmacist.  ICD 10 code given.  Informed pt's wife that call was made & hopefully everything will go through but if she has questions may call [phone] to check on script.  Pt & husband expressed appreciation.

## 2018-02-07 NOTE — Telephone Encounter (Signed)
Message from wife reporting pharmacy did not receive Robaxin script. E-scribed 4/2 order.

## 2018-02-08 ENCOUNTER — Telehealth: Payer: Self-pay | Admitting: Oncology

## 2018-02-08 NOTE — Telephone Encounter (Signed)
Faxed records to [facility] at [facility].  Once ins is approved [facility] will call pt with appt.

## 2018-02-08 NOTE — Telephone Encounter (Signed)
Oral Oncology Patient Advocate Encounter  Received notification from [facility] that delivery of [name] Revlimid has been scheduled for 02/09/2018.   I left a message for Mr.and Mrs. Meiners to offer any further assistance if needed.    Fabio Asa. Melynda Keller, [place] Patient [place] [phone] 02/08/2018 11:07 AM

## 2018-02-10 ENCOUNTER — Other Ambulatory Visit: Payer: Self-pay | Admitting: Oncology

## 2018-02-11 ENCOUNTER — Encounter (HOSPITAL_COMMUNITY): Payer: Self-pay | Admitting: Oncology

## 2018-02-12 ENCOUNTER — Inpatient Hospital Stay: Payer: BLUE CROSS/BLUE SHIELD

## 2018-02-12 ENCOUNTER — Inpatient Hospital Stay (HOSPITAL_BASED_OUTPATIENT_CLINIC_OR_DEPARTMENT_OTHER): Payer: BLUE CROSS/BLUE SHIELD | Admitting: Oncology

## 2018-02-12 VITALS — BP 131/81 | HR 99 | Temp 98.7°F | Resp 19 | Ht 66.0 in | Wt 175.7 lb

## 2018-02-12 DIAGNOSIS — R21 Rash and other nonspecific skin eruption: Secondary | ICD-10-CM

## 2018-02-12 DIAGNOSIS — N19 Unspecified kidney failure: Secondary | ICD-10-CM | POA: Diagnosis not present

## 2018-02-12 DIAGNOSIS — G893 Neoplasm related pain (acute) (chronic): Secondary | ICD-10-CM

## 2018-02-12 DIAGNOSIS — C9000 Multiple myeloma not having achieved remission: Secondary | ICD-10-CM

## 2018-02-12 DIAGNOSIS — J069 Acute upper respiratory infection, unspecified: Secondary | ICD-10-CM | POA: Diagnosis not present

## 2018-02-12 DIAGNOSIS — D696 Thrombocytopenia, unspecified: Secondary | ICD-10-CM | POA: Diagnosis not present

## 2018-02-12 DIAGNOSIS — R634 Abnormal weight loss: Secondary | ICD-10-CM

## 2018-02-12 DIAGNOSIS — D649 Anemia, unspecified: Secondary | ICD-10-CM | POA: Diagnosis not present

## 2018-02-12 DIAGNOSIS — R040 Epistaxis: Secondary | ICD-10-CM | POA: Diagnosis not present

## 2018-02-12 DIAGNOSIS — R600 Localized edema: Secondary | ICD-10-CM

## 2018-02-12 DIAGNOSIS — Z792 Long term (current) use of antibiotics: Secondary | ICD-10-CM | POA: Diagnosis not present

## 2018-02-12 DIAGNOSIS — J301 Allergic rhinitis due to pollen: Secondary | ICD-10-CM | POA: Diagnosis not present

## 2018-02-12 DIAGNOSIS — M62830 Muscle spasm of back: Secondary | ICD-10-CM

## 2018-02-12 DIAGNOSIS — Z5111 Encounter for antineoplastic chemotherapy: Secondary | ICD-10-CM | POA: Diagnosis not present

## 2018-02-12 DIAGNOSIS — D701 Agranulocytosis secondary to cancer chemotherapy: Secondary | ICD-10-CM | POA: Diagnosis not present

## 2018-02-12 DIAGNOSIS — L299 Pruritus, unspecified: Secondary | ICD-10-CM | POA: Diagnosis not present

## 2018-02-12 DIAGNOSIS — D709 Neutropenia, unspecified: Secondary | ICD-10-CM | POA: Diagnosis not present

## 2018-02-12 DIAGNOSIS — T451X5S Adverse effect of antineoplastic and immunosuppressive drugs, sequela: Secondary | ICD-10-CM | POA: Diagnosis not present

## 2018-02-12 DIAGNOSIS — R509 Fever, unspecified: Secondary | ICD-10-CM | POA: Diagnosis not present

## 2018-02-12 LAB — CMP (CANCER CENTER ONLY)
ALT: 24 U/L (ref 0–55)
AST: 12 U/L (ref 5–34)
Albumin: 3.8 g/dL (ref 3.5–5.0)
Alkaline Phosphatase: 336 U/L — ABNORMAL HIGH (ref 40–150)
Anion gap: 7 (ref 3–11)
BILIRUBIN TOTAL: 0.5 mg/dL (ref 0.2–1.2)
BUN: 19 mg/dL (ref 7–26)
CALCIUM: 9 mg/dL (ref 8.4–10.4)
CHLORIDE: 106 mmol/L (ref 98–109)
CO2: 25 mmol/L (ref 22–29)
CREATININE: 1.47 mg/dL — AB (ref 0.70–1.30)
GFR, EST AFRICAN AMERICAN: 59 mL/min — AB (ref >60)
GFR, EST NON AFRICAN AMERICAN: 51 mL/min — AB (ref >60)
Glucose, Bld: 103 mg/dL (ref 70–140)
Potassium: 3.8 mmol/L (ref 3.5–5.1)
Sodium: 138 mmol/L (ref 136–145)
TOTAL PROTEIN: 6.4 g/dL (ref 6.4–8.3)

## 2018-02-12 LAB — CBC WITH DIFFERENTIAL (CANCER CENTER ONLY)
BASOS ABS: 0 10*3/uL (ref 0.0–0.1)
BASOS PCT: 0 %
EOS ABS: 0.1 10*3/uL (ref 0.0–0.5)
Eosinophils Relative: 6 %
HCT: 27.6 % — ABNORMAL LOW (ref 38.4–49.9)
Hemoglobin: 9.5 g/dL — ABNORMAL LOW (ref 13.0–17.1)
Lymphocytes Relative: 14 %
Lymphs Abs: 0.3 10*3/uL — ABNORMAL LOW (ref 0.9–3.3)
MCH: 33.1 pg (ref 27.2–33.4)
MCHC: 34.4 g/dL (ref 32.0–36.0)
MCV: 96.2 fL (ref 79.3–98.0)
Monocytes Absolute: 0.1 10*3/uL (ref 0.1–0.9)
Monocytes Relative: 6 %
NEUTROS PCT: 74 %
Neutro Abs: 1.6 10*3/uL (ref 1.5–6.5)
PLATELETS: 117 10*3/uL — AB (ref 140–400)
RBC: 2.87 MIL/uL — AB (ref 4.20–5.82)
RDW: 16.2 % — ABNORMAL HIGH (ref 11.0–14.6)
WBC: 2.1 10*3/uL — AB (ref 4.0–10.3)

## 2018-02-12 LAB — CHROMOSOME ANALYSIS, BONE MARROW

## 2018-02-12 LAB — TISSUE HYBRIDIZATION (BONE MARROW)-NCBH

## 2018-02-12 MED ORDER — PROCHLORPERAZINE MALEATE 10 MG PO TABS
10.0000 mg | ORAL_TABLET | Freq: Once | ORAL | Status: AC
  Administered 2018-02-12: 10 mg via ORAL

## 2018-02-12 MED ORDER — BORTEZOMIB CHEMO SQ INJECTION 3.5 MG (2.5MG/ML)
1.3000 mg/m2 | Freq: Once | INTRAMUSCULAR | Status: AC
  Administered 2018-02-12: 2.5 mg via SUBCUTANEOUS
  Filled 2018-02-12: qty 2.5

## 2018-02-12 MED ORDER — DEXAMETHASONE 4 MG PO TABS
40.0000 mg | ORAL_TABLET | ORAL | Status: DC
Start: 2018-02-12 — End: 2018-02-12
  Administered 2018-02-12: 40 mg via ORAL

## 2018-02-12 MED ORDER — PROCHLORPERAZINE MALEATE 10 MG PO TABS
ORAL_TABLET | ORAL | Status: AC
  Filled 2018-02-12: qty 1

## 2018-02-12 MED ORDER — DEXAMETHASONE 4 MG PO TABS
ORAL_TABLET | ORAL | Status: AC
  Filled 2018-02-12: qty 10

## 2018-02-12 NOTE — Progress Notes (Signed)
  [place] OFFICE PROGRESS NOTE   Diagnosis: Myeloma  INTERVAL HISTORY:   [name] returns as scheduled.  He completed week to of Velcade/Decadron on 02/05/2018.  He began Revlimid on 02/09/2018.  No nausea or diarrhea.  Back pain has improved.  He has a good appetite, but has lost weight.  He has noted a rash beneath the breast.  He also has pruritus at the posterior scalp.  Objective:  Vital signs in last 24 hours:  Blood pressure 131/81, pulse 99, temperature 98.7 F (37.1 C), temperature source Oral, resp. rate 19, height '5\' 6"'$  (1.676 m), weight 175 lb 11.2 oz (79.7 kg), SpO2 100 %.    HEENT: No thrush Resp: Lungs clear bilaterally Cardio: Regular rate and rhythm GI: No hepatosplenomegaly, nontender Vascular: No leg edema  Skin: Acne type rash inferior to the breast bilaterally   Lab Results:  Lab Results  Component Value Date   WBC 2.1 (L) 02/12/2018   HGB 10.8 (L) 01/25/2018   HCT 27.6 (L) 02/12/2018   MCV 96.2 02/12/2018   PLT 117 (L) 02/12/2018   NEUTROABS 1.6 02/12/2018    CMP     Component Value Date/Time   NA 138 02/12/2018 1423   K 3.8 02/12/2018 1423   CL 106 02/12/2018 1423   CO2 25 02/12/2018 1423   GLUCOSE 103 02/12/2018 1423   BUN 19 02/12/2018 1423   CREATININE 1.47 (H) 02/12/2018 1423   CALCIUM 9.0 02/12/2018 1423   PROT 6.4 02/12/2018 1423   ALBUMIN 3.8 02/12/2018 1423   AST 12 02/12/2018 1423   ALT 24 02/12/2018 1423   ALKPHOS 336 (H) 02/12/2018 1423   BILITOT 0.5 02/12/2018 1423   GFRNONAA 51 (L) 02/12/2018 1423   GFRAA 59 (L) 02/12/2018 1423     Medications: I have reviewed the patient's current medications.   Assessment/Plan: 1. Multiple myeloma   Anemia/thrombocytopenia  Bone marrow biopsy 01/25/2018-hypercellular bone marrow with extensive involvement by plasma cell neoplasm; flow cytometry with a population of cells with plasmacytic differentiation (13%), no monoclonal B-cell population identified, T cells  with nonspecific changes.  Cytogenetics- multiple rearrangements, FISH panel negative for cytogenetic abnormalities to be associated with multiple myeloma including loss of p53  01/24/2018 SPEP-no M spike,elevated serum lambda free light chains (432)  Dexamethasone 40 mg daily times 4 days beginning 01/25/2018  Velcade/dexamethasone weekly x3 followed by a 1 week break beginning 01/29/2018 (weekly dexamethasone initiated 02/05/2018, Revlimid initiated 02/09/2018) 2. Hypercalcemia status post pamidronate 01/24/2018, resolved 3. Diffuse abnormal bone marrow signal, compression fracture of L1 4. Back painsecondary to multiple myeloma, compression fracture.  Improved. 5. Anorexia/weight loss 6. Renal failure-secondary to multiple myeloma and hypercalcemia-improved      Disposition: [name] is completing a first cycle of RVD.  He is tolerating the treatment well.  Hypercalcemia has resolved.  His back pain is improved.  The renal failure has improved.  He will complete the first cycle of RVD over the next week.  He will return for an office visit prior to beginning cycle 2 on 02/26/2018.  We will keep the Revlimid dose at 10 mg daily for cycle 2.  We will consider dose escalating the Revlimid with cycle 3.  He is scheduled to see Dr. Ok Edwards for a transplant evaluation on 03/12/2018.  25 minutes were spent with the patient today.  The majority of the time was used for counseling and coordination of care.  Betsy Coder, MD  02/12/2018  5:06 PM

## 2018-02-12 NOTE — Patient Instructions (Signed)
[facility] Discharge Instructions for Patients Receiving Chemotherapy  Today you received the following chemotherapy agents Velcade.  To help prevent nausea and vomiting after your treatment, we encourage you to take your nausea medication as directed.  If you develop nausea and vomiting that is not controlled by your nausea medication, call the clinic.   BELOW ARE SYMPTOMS THAT SHOULD BE REPORTED IMMEDIATELY:  *FEVER GREATER THAN 100.5 F  *CHILLS WITH OR WITHOUT FEVER  NAUSEA AND VOMITING THAT IS NOT CONTROLLED WITH YOUR NAUSEA MEDICATION  *UNUSUAL SHORTNESS OF BREATH  *UNUSUAL BRUISING OR BLEEDING  TENDERNESS IN MOUTH AND THROAT WITH OR WITHOUT PRESENCE OF ULCERS  *URINARY PROBLEMS  *BOWEL PROBLEMS  UNUSUAL RASH Items with * indicate a potential emergency and should be followed up as soon as possible.  Feel free to call the clinic should you have any questions or concerns. The clinic phone number is [phone].  Please show the CHEMO ALERT CARD at check-in to the Emergency Department and triage nurse.   

## 2018-02-15 ENCOUNTER — Inpatient Hospital Stay: Payer: BLUE CROSS/BLUE SHIELD

## 2018-02-15 ENCOUNTER — Inpatient Hospital Stay (HOSPITAL_BASED_OUTPATIENT_CLINIC_OR_DEPARTMENT_OTHER): Payer: BLUE CROSS/BLUE SHIELD | Admitting: Medical

## 2018-02-15 ENCOUNTER — Telehealth: Payer: Self-pay

## 2018-02-15 VITALS — BP 132/74 | HR 94 | Temp 98.2°F | Resp 18 | Ht 66.0 in | Wt 182.2 lb

## 2018-02-15 DIAGNOSIS — Z792 Long term (current) use of antibiotics: Secondary | ICD-10-CM | POA: Diagnosis not present

## 2018-02-15 DIAGNOSIS — D696 Thrombocytopenia, unspecified: Secondary | ICD-10-CM | POA: Diagnosis not present

## 2018-02-15 DIAGNOSIS — D709 Neutropenia, unspecified: Secondary | ICD-10-CM | POA: Diagnosis not present

## 2018-02-15 DIAGNOSIS — D649 Anemia, unspecified: Secondary | ICD-10-CM

## 2018-02-15 DIAGNOSIS — T451X5S Adverse effect of antineoplastic and immunosuppressive drugs, sequela: Secondary | ICD-10-CM | POA: Diagnosis not present

## 2018-02-15 DIAGNOSIS — C9000 Multiple myeloma not having achieved remission: Secondary | ICD-10-CM

## 2018-02-15 DIAGNOSIS — R5383 Other fatigue: Secondary | ICD-10-CM

## 2018-02-15 DIAGNOSIS — L299 Pruritus, unspecified: Secondary | ICD-10-CM | POA: Diagnosis not present

## 2018-02-15 DIAGNOSIS — Z5111 Encounter for antineoplastic chemotherapy: Secondary | ICD-10-CM | POA: Diagnosis not present

## 2018-02-15 DIAGNOSIS — Z79899 Other long term (current) drug therapy: Secondary | ICD-10-CM

## 2018-02-15 DIAGNOSIS — R600 Localized edema: Secondary | ICD-10-CM | POA: Diagnosis not present

## 2018-02-15 DIAGNOSIS — R509 Fever, unspecified: Secondary | ICD-10-CM

## 2018-02-15 DIAGNOSIS — R634 Abnormal weight loss: Secondary | ICD-10-CM | POA: Diagnosis not present

## 2018-02-15 DIAGNOSIS — G301 Alzheimer's disease with late onset: Secondary | ICD-10-CM

## 2018-02-15 DIAGNOSIS — M8448XS Pathological fracture, other site, sequela: Secondary | ICD-10-CM

## 2018-02-15 DIAGNOSIS — D701 Agranulocytosis secondary to cancer chemotherapy: Secondary | ICD-10-CM | POA: Diagnosis not present

## 2018-02-15 DIAGNOSIS — J069 Acute upper respiratory infection, unspecified: Secondary | ICD-10-CM | POA: Diagnosis not present

## 2018-02-15 DIAGNOSIS — J301 Allergic rhinitis due to pollen: Secondary | ICD-10-CM | POA: Diagnosis not present

## 2018-02-15 DIAGNOSIS — G893 Neoplasm related pain (acute) (chronic): Secondary | ICD-10-CM | POA: Diagnosis not present

## 2018-02-15 DIAGNOSIS — R040 Epistaxis: Secondary | ICD-10-CM

## 2018-02-15 DIAGNOSIS — R21 Rash and other nonspecific skin eruption: Secondary | ICD-10-CM | POA: Diagnosis not present

## 2018-02-15 DIAGNOSIS — M549 Dorsalgia, unspecified: Secondary | ICD-10-CM

## 2018-02-15 LAB — CBC WITH DIFFERENTIAL (CANCER CENTER ONLY)
BASOS ABS: 0 10*3/uL (ref 0.0–0.1)
BASOS PCT: 1 %
Eosinophils Absolute: 0.2 10*3/uL (ref 0.0–0.5)
Eosinophils Relative: 8 %
HEMATOCRIT: 27.1 % — AB (ref 38.4–49.9)
Hemoglobin: 9.1 g/dL — ABNORMAL LOW (ref 13.0–17.1)
LYMPHS PCT: 12 %
Lymphs Abs: 0.4 10*3/uL — ABNORMAL LOW (ref 0.9–3.3)
MCH: 32.7 pg (ref 27.2–33.4)
MCHC: 33.6 g/dL (ref 32.0–36.0)
MCV: 97.5 fL (ref 79.3–98.0)
MONO ABS: 0.3 10*3/uL (ref 0.1–0.9)
Monocytes Relative: 11 %
NEUTROS ABS: 2 10*3/uL (ref 1.5–6.5)
NEUTROS PCT: 68 %
Platelet Count: 120 10*3/uL — ABNORMAL LOW (ref 140–400)
RBC: 2.78 MIL/uL — AB (ref 4.20–5.82)
RDW: 16.6 % — AB (ref 11.0–14.6)
WBC: 2.9 10*3/uL — AB (ref 4.0–10.3)

## 2018-02-15 MED ORDER — AZITHROMYCIN 250 MG PO TABS: ORAL_TABLET | ORAL | 0 refills | Status: DC

## 2018-02-15 NOTE — Telephone Encounter (Signed)
Returned call to pt. Pt reports "fever last night around 101. I took tylenol and knocked it down. Around 4am I had a lot of sinus pressure and noticed blood when I blew my nose. I had some chills this morning after I woke up and drank cold water.   Per Dr. Benay Spice, pt to come in to see Sandi Mealy. Pt voiced understanding and is agreeable with plan.

## 2018-02-18 DIAGNOSIS — J309 Allergic rhinitis, unspecified: Secondary | ICD-10-CM | POA: Insufficient documentation

## 2018-02-18 NOTE — Progress Notes (Signed)
[facility] Progress Note   Paul Green [id number] [birth year] 57 y.o.  Paul Green is managed by Dr. [name]  Actively treated with chemotherapy: yes  Current Therapy: Velcade and Revlimid  Last Treated: 04 / 09 / 2019 (cycle 1, day 15)  Assessment: Plan:    Fever, unspecified fever cause - Plan: azithromycin (ZITHROMAX Z-PAK) 250 MG tablet  Multiple myeloma not having achieved remission ([place])  Allergic rhinitis due to pollen, unspecified seasonality   Fever: The patient was given a prescription for a Z-Pak.  He was instructed to call or follow-up should his fever continue despite an antibiotic or should he develop new symptoms.  Multiple myeloma: The patient continues to be treated with Velcade and Revlimid under the care of Dr. [name].  He will see Dr. Benay Spice next on 02/26/2018.  Allergic rhinitis: The patient was instructed to begin over-the-counter Flonase, 1 spray in each nostril twice daily.  Please see After Visit Summary for patient specific instructions.  Future Appointments  Date Time Provider [place]  02/26/2018  3:00 PM [facility] LAB 4 [facility] None  02/26/2018  3:30 PM [name], MD [facility] None  02/26/2018  4:00 PM [facility] F20 [facility] None  03/05/2018  2:00 PM [facility] LAB 3 [facility] None  03/05/2018  3:00 PM [facility] C8 [facility] None  03/11/2018  2:00 PM [facility] LAB 4 [facility] None  03/11/2018  3:00 PM [facility] B6 [facility] None    No orders of the defined types were placed in this encounter.      Subjective:   Patient ID:  Paul Green is a 57 y.o. (DOB [birth year]) male.  Chief Complaint:  Chief Complaint  Patient presents with  . Fever  . Fatigue  . Epistaxis  . Chills    HPI Paul Green is a 57 year old male with a history of multiple myeloma who is managed by Dr. [name].  The patient is currently being treated with Velcade and Revlimid.   He was most recently treated with cycle 1, day 15 of Velcade which was dosed on 02/12/2018.  He additionally takes Revlimid 10 mg each evening for 2 weeks on and one week off.  He presents to the office today with a report of a fever of 101 last evening and 100 this morning.  He has had intermittent epistaxis and reports fatigue and chills.  He denies nausea, vomiting, facial pressure, sinus pain, dysuria, or a cough.  He continues to have back pain at the site of a compression fracture.  He is ambulating with the use of a rolling walker.  Medications: I have reviewed the patient's current medications.  Allergies: No Known Allergies  No past medical history on file.  Past Surgical History:  Procedure Laterality Date  . VASECTOMY  1985    Family History  Problem Relation Age of Onset  . Skin cancer Mother   . Atrial fibrillation Father   . Melanoma Father   . Diabetes Sister   . Melanoma Brother   . Stroke Other   . CAD Neg Hx     Social History   Socioeconomic History  . Marital status: Married    Spouse name: Not on file  . Number of children: Not on file  . Years of education: Not on file  . Highest education level: Not on file  Occupational History  . Not on file  Social Needs  . Financial resource strain: Not on file  . Food insecurity:  Worry: Not on file    Inability: Not on file  . Transportation needs:    Medical: Not on file    Non-medical: Not on file  Tobacco Use  . Smoking status: Never Smoker  . Smokeless tobacco: Never Used  Substance and Sexual Activity  . Alcohol use: Yes    Comment: occ  . Drug use: Not on file  . Sexual activity: Not on file  Lifestyle  . Physical activity:    Days per week: Not on file    Minutes per session: Not on file  . Stress: Not on file  Relationships  . Social connections:    Talks on phone: Not on file    Gets together: Not on file    Attends religious service: Not on file    Active member of club or  organization: Not on file    Attends meetings of clubs or organizations: Not on file    Relationship status: Not on file  . Intimate partner violence:    Fear of current or ex partner: Not on file    Emotionally abused: Not on file    Physically abused: Not on file    Forced sexual activity: Not on file  Other Topics Concern  . Not on file  Social History Narrative  . Not on file    Past Medical History, Surgical history, Social history, and Family history were reviewed and updated as appropriate.   Please see review of systems for further details on the patient's review from today.   Review of Systems:  Review of Systems  Constitutional: Positive for chills, fatigue and fever. Negative for diaphoresis.  HENT: Negative for congestion, postnasal drip, rhinorrhea, sinus pressure, sinus pain, sneezing and sore throat.   Respiratory: Negative for cough, choking, chest tightness, shortness of breath and wheezing.   Cardiovascular: Negative for chest pain and palpitations.  Musculoskeletal: Positive for back pain.  Neurological: Negative for headaches.    Objective:   Physical Exam:  BP 132/74 (BP Location: Left Arm, Patient Position: Sitting)   Pulse 94   Temp 98.2 F (36.8 C) (Oral)   Resp 18   Ht '5\' 6"'  (1.676 m)   Wt 182 lb 3.2 oz (82.6 kg)   SpO2 100%   BMI 29.41 kg/m  ECOG: 0  Physical Exam  Constitutional: No distress.  HENT:  Head: Normocephalic and atraumatic.  Right Ear: External ear normal.  Left Ear: External ear normal.  Mouth/Throat: Oropharynx is clear and moist. No oropharyngeal exudate.  Turbinates are pale bilaterally.  There is a small area of dried blood in the right nares.  Neck: Normal range of motion. Neck supple.  Cardiovascular: Normal rate, regular rhythm and normal heart sounds. Exam reveals no gallop and no friction rub.  No murmur heard. Pulmonary/Chest: Effort normal and breath sounds normal. No respiratory distress. He has no wheezes. He  has no rales.  Lymphadenopathy:    He has no cervical adenopathy.  Neurological: He is alert. Coordination (The patient is ambulating with the use of a rolling walker.) abnormal.  Skin: Skin is warm and dry. No rash noted. He is not diaphoretic. No erythema.  Psychiatric: He has a normal mood and affect. His behavior is normal. Judgment and thought content normal.    Lab Review:     Component Value Date/Time   NA 138 02/12/2018 1423   K 3.8 02/12/2018 1423   CL 106 02/12/2018 1423   CO2 25 02/12/2018 1423   GLUCOSE 103  02/12/2018 1423   BUN 19 02/12/2018 1423   CREATININE 1.47 (H) 02/12/2018 1423   CALCIUM 9.0 02/12/2018 1423   PROT 6.4 02/12/2018 1423   ALBUMIN 3.8 02/12/2018 1423   AST 12 02/12/2018 1423   ALT 24 02/12/2018 1423   ALKPHOS 336 (H) 02/12/2018 1423   BILITOT 0.5 02/12/2018 1423   GFRNONAA 51 (L) 02/12/2018 1423   GFRAA 59 (L) 02/12/2018 1423       Component Value Date/Time   WBC 2.9 (L) 02/15/2018 1122   WBC 4.1 01/25/2018 0611   RBC 2.78 (L) 02/15/2018 1122   HGB 10.8 (L) 01/25/2018 0611   HCT 27.1 (L) 02/15/2018 1122   PLT 120 (L) 02/15/2018 1122   MCV 97.5 02/15/2018 1122   MCH 32.7 02/15/2018 1122   MCHC 33.6 02/15/2018 1122   RDW 16.6 (H) 02/15/2018 1122   LYMPHSABS 0.4 (L) 02/15/2018 1122   MONOABS 0.3 02/15/2018 1122   EOSABS 0.2 02/15/2018 1122   BASOSABS 0.0 02/15/2018 1122   -------------------------------  Imaging from last 24 hours (if applicable):  Radiology interpretation: Dg Chest 2 View  Result Date: 01/23/2018 CLINICAL DATA:  Generalized weakness. 30 pound weight loss. Hypercalcemia. EXAM: CHEST - 2 VIEW COMPARISON:  None. FINDINGS: Low lung volumes. Normal heart size and mediastinal contours with mild aortic atherosclerosis. Streaky bibasilar opacities are likely atelectasis. No evidence of focal pulmonary mass. No pulmonary edema, pleural effusion or pneumothorax. No acute osseous abnormalities are seen. IMPRESSION: Low lung  volumes with bibasilar atelectasis. Electronically Signed   By: Jeb Levering M.D.   On: 01/23/2018 23:35   Dg Lumbar Spine Complete  Result Date: 01/23/2018 CLINICAL DATA:  30 pound weight loss. Hypercalcemia. Generalized weakness and back pain. EXAM: LUMBAR SPINE - COMPLETE 4+ VIEW COMPARISON:  None. FINDINGS: L1 compression fracture with approximately 40% loss of height anteriorly. No posterior cortex involvement. Remaining lumbar vertebral body heights are maintained. Diffuse endplate spurring with mild diffuse disc space narrowing. The alignment is maintained. Sacroiliac joints are congruent. No radiographic evidence of focal bone lesion. IMPRESSION: 1. L1 compression fracture with 40% loss of height anteriorly, age indeterminate. 2. Mild diffuse spondylosis with endplate spurring and diffuse disc space narrowing. Electronically Signed   By: Jeb Levering M.D.   On: 01/23/2018 23:37   Mr Thoracic Spine Wo Contrast  Result Date: 01/24/2018 CLINICAL DATA:  57 year old male with unexplained 30 lb weight loss, fatigue. Back pain since December, which recently became severe with back spasm. Hypercalcemia. L1 compression fracture on lumbar radiographs yesterday. IV contrast was deferred at this time due to renal insufficiency (estimated GFR 25). EXAM: MRI THORACIC AND LUMBAR SPINE WITHOUT CONTRAST TECHNIQUE: Multiplanar and multiecho pulse sequences of the thoracic and lumbar spine were obtained without intravenous contrast. COMPARISON:  Chest and lumbar radiographs 01/23/2018. FINDINGS: MRI THORACIC SPINE FINDINGS Limited cervical spine imaging: Diffusely abnormal bone marrow signal throughout the cervical spine, vertebral and posterior element involvement. Possible C6 ventral epidural extension of the abnormality with mild C6-C7 spinal stenosis suspected (series 19001, image 6). Similar abnormal marrow signal at the skull base. Abnormal marrow signal in the visible sternum and manubrium. Thoracic  spine segmentation:  Normal. Alignment: Preserved thoracic vertebral height and kyphosis. Borderline to mild loss of mid and lower thoracic vertebral height such as T7 and T8. Vertebrae: Diffusely abnormal marrow signal throughout the thoracic vertebrae including vertebral body, pedicle, posterior element involvement, and bilateral rib involvement. Abnormally expanded left T4 pedicle with early extension of the abnormality and to the  left ventral epidural space and the left T4 neural foramen (series 8001, image 13). More moderate ventral epidural extension of tumor at the T8 and T9 levels (images 25 and 28). The appearance is more pronounced at T9 in greater on the right. But there is no significant spinal stenosis. However, there is moderate involvement of the right T9 neural foramen which appears moderately to severely narrow. Mild-to-moderate ventral epidural extension at T11. Moderate left T11 neural foraminal involvement and mild to moderate foraminal stenosis. Similar epidural and right foraminal involvement at T12, with moderate to severe right T12 foraminal stenosis. Cord: Thoracic spinal cord signal and morphology remains normal at all levels. There is borderline to Mild spinal stenosis at T9 (series 23001, image 28). The conus medullaris is normal at T12-L1. Paraspinal and other soft tissues: Intermittent wrap artifact on axial images. Negative visible thoracic and upper abdominal viscera. The posterior paraspinal soft tissues remain within normal limits. Disc levels: No significant disc degeneration. MRI LUMBAR SPINE FINDINGS Segmentation: Normal, concordant with the thoracic spine numbering today. Alignment:  Stable alignment.  No spondylolisthesis. Vertebrae: Diffusely abnormal marrow signal throughout the visible lumbosacral spine and pelvis in keeping with the cervical and thoracic findings. Moderate compression fracture of L1 with central loss of vertebral body height up to 52%. Early ventral  epidural extension of tumor on the left side (series 4001, image 9). Early left foraminal involvement without definite foraminal stenosis. Similar early ventral epidural and foraminal involvement on the left at L2. No stenosis. Mild compression fracture of the right lateral L3 vertebral body best demonstrated on series 6001, image 13. Early right L3 neural foraminal involvement without stenosis. Early ventral epidural tumor involvement at L4. No malignant stenosis at that level, although there is combined mild to moderate right L4 foraminal stenosis which is in part related to degenerative facet hypertrophy. Early ventral epidural involvement at L5.  No stenosis. Diffuse visible sacral and pelvic involvement with probable ventral presacral space extension of tumor. Conus medullaris and cauda equina: Conus extends to the T12-L1 level. No thickening of the cauda equina nerve roots. Paraspinal and other soft tissues: Moderate edema in the medial right psoas muscle from L2-L3 to the L5 level. Patchy edema in the medial erector spinae muscles at the L3 through L5 spinous process levels (series 6001, image 1). Negative visible abdominal viscera. No retroperitoneal lymphadenopathy. Disc levels: No significant disc degeneration. IMPRESSION: 1. 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 Top differential considerations include lymphoma, multiple myeloma, and widespread metastatic disease. 2. Multilevel tumor extension into the epidural space, although generally mild. There is borderline to mild malignant spinal stenosis suspected at both C6 and T9. No spinal cord mass effect or signal abnormality. 3. Multilevel tumor extension into the neural foramina: Left T4, right T9, left T11, right T12, right L3 nerve levels. 4. Pathologic compression fractures of L1 (50%) and the right L3 (mild) vertebral bodies.  Minimal to mild loss of midthoracic vertebral body height. 5. Reactive medial right psoas muscle and medial lumbar erector spinae muscle edema. 6. Negative partially visible thoracic and abdominal viscera. Electronically Signed   By: Genevie Ann M.D.   On: 01/24/2018 10:32   Mr Lumbar Spine Wo Contrast  Result Date: 01/24/2018 CLINICAL DATA:  57 year old male with unexplained 30 lb weight loss, fatigue. Back pain since December, which recently became severe with back spasm. Hypercalcemia. L1 compression fracture on lumbar radiographs yesterday.  IV contrast was deferred at this time due to renal insufficiency (estimated GFR 25). EXAM: MRI THORACIC AND LUMBAR SPINE WITHOUT CONTRAST TECHNIQUE: Multiplanar and multiecho pulse sequences of the thoracic and lumbar spine were obtained without intravenous contrast. COMPARISON:  Chest and lumbar radiographs 01/23/2018. FINDINGS: MRI THORACIC SPINE FINDINGS Limited cervical spine imaging: Diffusely abnormal bone marrow signal throughout the cervical spine, vertebral and posterior element involvement. Possible C6 ventral epidural extension of the abnormality with mild C6-C7 spinal stenosis suspected (series 19001, image 6). Similar abnormal marrow signal at the skull base. Abnormal marrow signal in the visible sternum and manubrium. Thoracic spine segmentation:  Normal. Alignment: Preserved thoracic vertebral height and kyphosis. Borderline to mild loss of mid and lower thoracic vertebral height such as T7 and T8. Vertebrae: Diffusely abnormal marrow signal throughout the thoracic vertebrae including vertebral body, pedicle, posterior element involvement, and bilateral rib involvement. Abnormally expanded left T4 pedicle with early extension of the abnormality and to the left ventral epidural space and the left T4 neural foramen (series 8001, image 13). More moderate ventral epidural extension of tumor at the T8 and T9 levels (images 25 and 28). The appearance is more  pronounced at T9 in greater on the right. But there is no significant spinal stenosis. However, there is moderate involvement of the right T9 neural foramen which appears moderately to severely narrow. Mild-to-moderate ventral epidural extension at T11. Moderate left T11 neural foraminal involvement and mild to moderate foraminal stenosis. Similar epidural and right foraminal involvement at T12, with moderate to severe right T12 foraminal stenosis. Cord: Thoracic spinal cord signal and morphology remains normal at all levels. There is borderline to Mild spinal stenosis at T9 (series 23001, image 28). The conus medullaris is normal at T12-L1. Paraspinal and other soft tissues: Intermittent wrap artifact on axial images. Negative visible thoracic and upper abdominal viscera. The posterior paraspinal soft tissues remain within normal limits. Disc levels: No significant disc degeneration. MRI LUMBAR SPINE FINDINGS Segmentation: Normal, concordant with the thoracic spine numbering today. Alignment:  Stable alignment.  No spondylolisthesis. Vertebrae: Diffusely abnormal marrow signal throughout the visible lumbosacral spine and pelvis in keeping with the cervical and thoracic findings. Moderate compression fracture of L1 with central loss of vertebral body height up to 52%. Early ventral epidural extension of tumor on the left side (series 4001, image 9). Early left foraminal involvement without definite foraminal stenosis. Similar early ventral epidural and foraminal involvement on the left at L2. No stenosis. Mild compression fracture of the right lateral L3 vertebral body best demonstrated on series 6001, image 13. Early right L3 neural foraminal involvement without stenosis. Early ventral epidural tumor involvement at L4. No malignant stenosis at that level, although there is combined mild to moderate right L4 foraminal stenosis which is in part related to degenerative facet hypertrophy. Early ventral epidural  involvement at L5.  No stenosis. Diffuse visible sacral and pelvic involvement with probable ventral presacral space extension of tumor. Conus medullaris and cauda equina: Conus extends to the T12-L1 level. No thickening of the cauda equina nerve roots. Paraspinal and other soft tissues: Moderate edema in the medial right psoas muscle from L2-L3 to the L5 level. Patchy edema in the medial erector spinae muscles at the L3 through L5 spinous process levels (series 6001, image 1). Negative visible abdominal viscera. No retroperitoneal lymphadenopathy. Disc levels: No significant disc degeneration. IMPRESSION: 1. Diffuse malignant infiltration of the bone marrow throughout the visible skeleton including the skull base, cervical through sacral spine, visible pelvis  and thorax. Top differential considerations include lymphoma, multiple myeloma, and widespread metastatic disease. 2. Multilevel tumor extension into the epidural space, although generally mild. There is borderline to mild malignant spinal stenosis suspected at both C6 and T9. No spinal cord mass effect or signal abnormality. 3. Multilevel tumor extension into the neural foramina: Left T4, right T9, left T11, right T12, right L3 nerve levels. 4. Pathologic compression fractures of L1 (50%) and the right L3 (mild) vertebral bodies. Minimal to mild loss of midthoracic vertebral body height. 5. Reactive medial right psoas muscle and medial lumbar erector spinae muscle edema. 6. Negative partially visible thoracic and abdominal viscera. Electronically Signed   By: Genevie Ann M.D.   On: 01/24/2018 10:32   Ct Bone Marrow Biopsy & Aspiration  Result Date: 01/25/2018 INDICATION: 57 year old with pancytopenia and concern for myeloma. Multiple bone lesions. EXAM: CT GUIDED BONE MARROW ASPIRATES AND BIOPSY Physician: Stephan Minister. Anselm Pancoast, MD MEDICATIONS: None. ANESTHESIA/SEDATION: Fentanyl 150 mcg IV; Versed 2.0 mg IV Moderate Sedation Time:  20 minutes The patient was  continuously monitored during the procedure by the interventional radiology nurse under my direct supervision. COMPLICATIONS: None immediate. PROCEDURE: The procedure was explained to the patient. The risks and benefits of the procedure were discussed and the patient's questions were addressed. Informed consent was obtained from the patient. The patient was placed prone on CT table. Images of the pelvis were obtained. The left side of back was prepped and draped in sterile fashion. The skin and left posterior ilium were anesthetized with 1% lidocaine. 11 gauge bone needle was directed into the left ilium with CT guidance. Two aspirates and 3 core biopsies were performed. Minimal fluid could be collected for aspiration. Two adequate core biopsies were obtained. Bandage placed over the puncture site. FINDINGS: Numerous lytic lesions scattered throughout the pelvic bones. In particular, there is an expansile partially destructive lesion involving the posterior right ilium measuring up to 3.2 cm. IMPRESSION: CT guided bone marrow aspiration and core biopsy. Multiple lytic bone lesions. Imaging findings are suggestive for metastatic disease or myeloma. Electronically Signed   By: Markus Daft M.D.   On: 01/25/2018 10:53    This was a shared visit with Sandi Mealy.  Paul Green was interviewed and examined. He presents today with an upper respiratory infection, most likely viral.  He will complete a course of azithromycin since he is immunocompromised.  Julieanne Manson, MD

## 2018-02-22 ENCOUNTER — Other Ambulatory Visit: Payer: Self-pay | Admitting: Oncology

## 2018-02-22 ENCOUNTER — Other Ambulatory Visit: Payer: Self-pay

## 2018-02-22 DIAGNOSIS — C9000 Multiple myeloma not having achieved remission: Secondary | ICD-10-CM

## 2018-02-22 MED ORDER — LENALIDOMIDE 10 MG PO CAPS: 10.0000 mg | ORAL_CAPSULE | Freq: Every day | ORAL | 0 refills | Status: DC

## 2018-02-24 ENCOUNTER — Other Ambulatory Visit: Payer: Self-pay | Admitting: Oncology

## 2018-02-25 ENCOUNTER — Other Ambulatory Visit: Payer: Self-pay | Admitting: Oncology

## 2018-02-26 ENCOUNTER — Inpatient Hospital Stay: Payer: BLUE CROSS/BLUE SHIELD

## 2018-02-26 ENCOUNTER — Inpatient Hospital Stay (HOSPITAL_BASED_OUTPATIENT_CLINIC_OR_DEPARTMENT_OTHER): Payer: BLUE CROSS/BLUE SHIELD | Admitting: Oncology

## 2018-02-26 ENCOUNTER — Telehealth: Payer: Self-pay | Admitting: Oncology

## 2018-02-26 VITALS — BP 127/91 | HR 103 | Temp 98.1°F | Resp 18 | Ht 66.0 in | Wt 177.0 lb

## 2018-02-26 DIAGNOSIS — D696 Thrombocytopenia, unspecified: Secondary | ICD-10-CM | POA: Diagnosis not present

## 2018-02-26 DIAGNOSIS — R600 Localized edema: Secondary | ICD-10-CM | POA: Diagnosis not present

## 2018-02-26 DIAGNOSIS — M549 Dorsalgia, unspecified: Secondary | ICD-10-CM | POA: Diagnosis not present

## 2018-02-26 DIAGNOSIS — C9000 Multiple myeloma not having achieved remission: Secondary | ICD-10-CM

## 2018-02-26 DIAGNOSIS — M8448XS Pathological fracture, other site, sequela: Secondary | ICD-10-CM

## 2018-02-26 DIAGNOSIS — G893 Neoplasm related pain (acute) (chronic): Secondary | ICD-10-CM

## 2018-02-26 DIAGNOSIS — R509 Fever, unspecified: Secondary | ICD-10-CM | POA: Diagnosis not present

## 2018-02-26 DIAGNOSIS — R5383 Other fatigue: Secondary | ICD-10-CM

## 2018-02-26 DIAGNOSIS — D649 Anemia, unspecified: Secondary | ICD-10-CM

## 2018-02-26 DIAGNOSIS — R630 Anorexia: Secondary | ICD-10-CM | POA: Diagnosis not present

## 2018-02-26 DIAGNOSIS — D701 Agranulocytosis secondary to cancer chemotherapy: Secondary | ICD-10-CM | POA: Diagnosis not present

## 2018-02-26 DIAGNOSIS — Z79899 Other long term (current) drug therapy: Secondary | ICD-10-CM

## 2018-02-26 DIAGNOSIS — N189 Chronic kidney disease, unspecified: Secondary | ICD-10-CM | POA: Diagnosis not present

## 2018-02-26 DIAGNOSIS — L299 Pruritus, unspecified: Secondary | ICD-10-CM | POA: Diagnosis not present

## 2018-02-26 DIAGNOSIS — Z792 Long term (current) use of antibiotics: Secondary | ICD-10-CM | POA: Diagnosis not present

## 2018-02-26 DIAGNOSIS — J069 Acute upper respiratory infection, unspecified: Secondary | ICD-10-CM | POA: Diagnosis not present

## 2018-02-26 DIAGNOSIS — Z5111 Encounter for antineoplastic chemotherapy: Secondary | ICD-10-CM | POA: Diagnosis not present

## 2018-02-26 DIAGNOSIS — T451X5S Adverse effect of antineoplastic and immunosuppressive drugs, sequela: Secondary | ICD-10-CM | POA: Diagnosis not present

## 2018-02-26 DIAGNOSIS — D709 Neutropenia, unspecified: Secondary | ICD-10-CM

## 2018-02-26 DIAGNOSIS — R21 Rash and other nonspecific skin eruption: Secondary | ICD-10-CM | POA: Diagnosis not present

## 2018-02-26 DIAGNOSIS — J301 Allergic rhinitis due to pollen: Secondary | ICD-10-CM | POA: Diagnosis not present

## 2018-02-26 DIAGNOSIS — R040 Epistaxis: Secondary | ICD-10-CM | POA: Diagnosis not present

## 2018-02-26 LAB — CBC WITH DIFFERENTIAL (CANCER CENTER ONLY)
Basophils Absolute: 0 10*3/uL (ref 0.0–0.1)
Basophils Relative: 2 %
Eosinophils Absolute: 0.1 10*3/uL (ref 0.0–0.5)
Eosinophils Relative: 9 %
HEMATOCRIT: 26.3 % — AB (ref 38.4–49.9)
HEMOGLOBIN: 8.9 g/dL — AB (ref 13.0–17.1)
LYMPHS ABS: 0.6 10*3/uL — AB (ref 0.9–3.3)
LYMPHS PCT: 52 %
MCH: 32.8 pg (ref 27.2–33.4)
MCHC: 33.8 g/dL (ref 32.0–36.0)
MCV: 97 fL (ref 79.3–98.0)
MONOS PCT: 31 %
Monocytes Absolute: 0.4 10*3/uL (ref 0.1–0.9)
NEUTROS ABS: 0.1 10*3/uL — AB (ref 1.5–6.5)
NEUTROS PCT: 6 %
Platelet Count: 235 10*3/uL (ref 140–400)
RBC: 2.71 MIL/uL — ABNORMAL LOW (ref 4.20–5.82)
RDW: 15.5 % — ABNORMAL HIGH (ref 11.0–14.6)
WBC Count: 1.2 10*3/uL — ABNORMAL LOW (ref 4.0–10.3)

## 2018-02-26 LAB — CMP (CANCER CENTER ONLY)
ALT: 69 U/L — AB (ref 0–55)
AST: 33 U/L (ref 5–34)
Albumin: 3.5 g/dL (ref 3.5–5.0)
Alkaline Phosphatase: 128 U/L (ref 40–150)
Anion gap: 12 — ABNORMAL HIGH (ref 3–11)
BUN: 23 mg/dL (ref 7–26)
CO2: 24 mmol/L (ref 22–29)
CREATININE: 1.34 mg/dL — AB (ref 0.70–1.30)
Calcium: 9.9 mg/dL (ref 8.4–10.4)
Chloride: 105 mmol/L (ref 98–109)
GFR, Estimated: 57 mL/min — ABNORMAL LOW (ref >60)
Glucose, Bld: 116 mg/dL (ref 70–140)
Potassium: 4 mmol/L (ref 3.5–5.1)
Sodium: 141 mmol/L (ref 136–145)
Total Bilirubin: 0.3 mg/dL (ref 0.2–1.2)
Total Protein: 6.7 g/dL (ref 6.4–8.3)

## 2018-02-26 MED ORDER — LEVOFLOXACIN 500 MG PO TABS
500.0000 mg | ORAL_TABLET | Freq: Every day | ORAL | 0 refills | Status: AC
Start: 2018-02-26 — End: 2018-03-08

## 2018-02-26 NOTE — Telephone Encounter (Signed)
Appointments scheduled AVS/Calendar printed per 4/23 los °

## 2018-02-26 NOTE — Progress Notes (Signed)
  [place] OFFICE PROGRESS NOTE   Diagnosis: Myeloma  INTERVAL HISTORY:   Paul Green returns as scheduled.  He was seen in the symptom management clinic with an upper respiratory infection on 02/15/2018.  He completed a course of azithromycin.  He reports the symptoms have resolved.  No fever.  He has mild tenderness associated with a right neck "gland ". The back pain is much improved.  He is no longer taking pain medication.  He is ambulating without a walker.  He completed the first course of Revlimid 02/22/2018.  Objective:  Vital signs in last 24 hours:  Blood pressure (!) 127/91, pulse (!) 103, temperature 98.1 F (36.7 C), temperature source Oral, resp. rate 18, height '5\' 6"'$  (1.676 m), weight 177 lb (80.3 kg), SpO2 100 %.    HEENT: No thrush or ulcers, right submandibular gland is palpable and without erythema or tenderness Resp: Lungs clear bilaterally Cardio: Regular rate and rhythm GI: No hepatosplenomegaly, nontender Vascular: Trace edema at the left greater than right ankle  Lab Results: Hemoglobin 8.9, platelets 235,000, white count 1.2, ANC 0.1  CMP     Component Value Date/Time   NA 141 02/26/2018 1446   K 4.0 02/26/2018 1446   CL 105 02/26/2018 1446   CO2 24 02/26/2018 1446   GLUCOSE 116 02/26/2018 1446   BUN 23 02/26/2018 1446   CREATININE 1.34 (H) 02/26/2018 1446   CALCIUM 9.9 02/26/2018 1446   PROT 6.7 02/26/2018 1446   ALBUMIN 3.5 02/26/2018 1446   AST 33 02/26/2018 1446   ALT 69 (H) 02/26/2018 1446   ALKPHOS 128 02/26/2018 1446   BILITOT 0.3 02/26/2018 1446   GFRNONAA 57 (L) 02/26/2018 1446   GFRAA >60 02/26/2018 1446     Medications: I have reviewed the patient's current medications.   Assessment/Plan: 1. Multiple myeloma   Anemia/thrombocytopenia  Bone marrow biopsy 01/25/2018-hypercellular bone marrow with extensive involvement by plasma cell neoplasm; flow cytometry with a population of cells with plasmacytic  differentiation (13%), no monoclonal B-cell population identified, T cells with nonspecific changes.  Cytogenetics- multiple rearrangements, FISH panel negative for cytogenetic abnormalities to be associated with multiple myeloma including loss of p53  01/24/2018 SPEP-noM spike,elevated serum lambda free light chains (432)  Dexamethasone 40 mg daily times 4 days beginning 01/25/2018  Velcade/dexamethasone weekly x3 followed by a 1 week break beginning3/26/2019(weekly dexamethasone initiated 02/05/2018, Revlimid initiated 02/09/2018) 2. Hypercalcemia status post pamidronate 01/24/2018, resolved 3. Diffuse abnormal bone marrow signal, compression fracture of L1 4. Back painsecondary to multiple myeloma, compression fracture.Improved. 5. Anorexia/weight loss-improved 6. 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 Severe neutropenia 02/26/2018-placed on Levaquin prophylaxis   Disposition: Mr. Carpenter has completed 1 cycle of pulse Decadron followed by a cycle of RVD.  His clinical status is improved.  The renal failure is improved.  We will follow-up on the serum light chains from today.  He has severe neutropenia today.  He will begin Levaquin prophylaxis.  He will contact us for a fever or symptoms of an infection.  Cycle 2 RVD will be placed on hold.  He will return for a CBC on 03/01/2018.  He will be scheduled for an office visit and cycle 2 RVD on 03/05/2018.  He will receive Zometa on 03/05/2018.  25 minutes were spent with the patient today.  The majority of the time was used for counseling and coordination of care.  Betsy Coder, MD  02/26/2018  3:48 PM

## 2018-02-26 NOTE — Addendum Note (Signed)
Addended by: Mathis Fare on: 02/26/2018 04:06 PM   Modules accepted: Orders

## 2018-02-26 NOTE — Progress Notes (Signed)
Critical results notification: ANC 0.1 Informed Dr. Benay Spice

## 2018-02-27 LAB — KAPPA/LAMBDA LIGHT CHAINS
KAPPA, LAMDA LIGHT CHAIN RATIO: 0.9 (ref 0.26–1.65)
Kappa free light chain: 25 mg/L — ABNORMAL HIGH (ref 3.3–19.4)
Lambda free light chains: 27.9 mg/L — ABNORMAL HIGH (ref 5.7–26.3)

## 2018-03-01 ENCOUNTER — Inpatient Hospital Stay: Payer: BLUE CROSS/BLUE SHIELD

## 2018-03-01 ENCOUNTER — Telehealth: Payer: Self-pay

## 2018-03-01 DIAGNOSIS — D709 Neutropenia, unspecified: Secondary | ICD-10-CM | POA: Diagnosis not present

## 2018-03-01 DIAGNOSIS — J069 Acute upper respiratory infection, unspecified: Secondary | ICD-10-CM | POA: Diagnosis not present

## 2018-03-01 DIAGNOSIS — R21 Rash and other nonspecific skin eruption: Secondary | ICD-10-CM | POA: Diagnosis not present

## 2018-03-01 DIAGNOSIS — D649 Anemia, unspecified: Secondary | ICD-10-CM | POA: Diagnosis not present

## 2018-03-01 DIAGNOSIS — T451X5S Adverse effect of antineoplastic and immunosuppressive drugs, sequela: Secondary | ICD-10-CM | POA: Diagnosis not present

## 2018-03-01 DIAGNOSIS — R040 Epistaxis: Secondary | ICD-10-CM | POA: Diagnosis not present

## 2018-03-01 DIAGNOSIS — C9000 Multiple myeloma not having achieved remission: Secondary | ICD-10-CM

## 2018-03-01 DIAGNOSIS — D696 Thrombocytopenia, unspecified: Secondary | ICD-10-CM | POA: Diagnosis not present

## 2018-03-01 DIAGNOSIS — R509 Fever, unspecified: Secondary | ICD-10-CM | POA: Diagnosis not present

## 2018-03-01 DIAGNOSIS — Z792 Long term (current) use of antibiotics: Secondary | ICD-10-CM | POA: Diagnosis not present

## 2018-03-01 DIAGNOSIS — D701 Agranulocytosis secondary to cancer chemotherapy: Secondary | ICD-10-CM | POA: Diagnosis not present

## 2018-03-01 DIAGNOSIS — L299 Pruritus, unspecified: Secondary | ICD-10-CM | POA: Diagnosis not present

## 2018-03-01 DIAGNOSIS — G893 Neoplasm related pain (acute) (chronic): Secondary | ICD-10-CM | POA: Diagnosis not present

## 2018-03-01 DIAGNOSIS — J301 Allergic rhinitis due to pollen: Secondary | ICD-10-CM | POA: Diagnosis not present

## 2018-03-01 DIAGNOSIS — Z5111 Encounter for antineoplastic chemotherapy: Secondary | ICD-10-CM | POA: Diagnosis not present

## 2018-03-01 LAB — CBC WITH DIFFERENTIAL (CANCER CENTER ONLY)
BASOS ABS: 0 10*3/uL (ref 0.0–0.1)
Basophils Relative: 3 %
EOS ABS: 0.1 10*3/uL (ref 0.0–0.5)
Eosinophils Relative: 6 %
HCT: 24.4 % — ABNORMAL LOW (ref 38.4–49.9)
HEMOGLOBIN: 8.4 g/dL — AB (ref 13.0–17.1)
Lymphocytes Relative: 44 %
Lymphs Abs: 0.6 10*3/uL — ABNORMAL LOW (ref 0.9–3.3)
MCH: 33.1 pg (ref 27.2–33.4)
MCHC: 34.3 g/dL (ref 32.0–36.0)
MCV: 96.7 fL (ref 79.3–98.0)
Monocytes Absolute: 0.4 10*3/uL (ref 0.1–0.9)
Monocytes Relative: 28 %
NEUTROS PCT: 19 %
Neutro Abs: 0.3 10*3/uL — CL (ref 1.5–6.5)
PLATELETS: 249 10*3/uL (ref 140–400)
RBC: 2.52 MIL/uL — AB (ref 4.20–5.82)
RDW: 16 % — ABNORMAL HIGH (ref 11.0–14.6)
WBC: 1.3 10*3/uL — AB (ref 4.0–10.3)

## 2018-03-01 NOTE — Telephone Encounter (Signed)
Called patient and notified him per Dr. Benay Spice to not take the Revlimid, continue Levaquin, call if fever, patient verbalized an understanding.    Appointment with Dr. Benay Spice on Tuesday, 03/05/2018

## 2018-03-05 ENCOUNTER — Other Ambulatory Visit: Payer: BLUE CROSS/BLUE SHIELD

## 2018-03-05 ENCOUNTER — Telehealth: Payer: Self-pay

## 2018-03-05 ENCOUNTER — Inpatient Hospital Stay: Payer: BLUE CROSS/BLUE SHIELD

## 2018-03-05 ENCOUNTER — Encounter: Payer: Self-pay | Admitting: Nurse Practitioner

## 2018-03-05 ENCOUNTER — Inpatient Hospital Stay (HOSPITAL_BASED_OUTPATIENT_CLINIC_OR_DEPARTMENT_OTHER): Payer: BLUE CROSS/BLUE SHIELD | Admitting: Nurse Practitioner

## 2018-03-05 VITALS — BP 130/94 | HR 104 | Temp 97.8°F | Resp 20 | Ht 66.0 in | Wt 179.0 lb

## 2018-03-05 DIAGNOSIS — M8448XS Pathological fracture, other site, sequela: Secondary | ICD-10-CM | POA: Diagnosis not present

## 2018-03-05 DIAGNOSIS — Z79899 Other long term (current) drug therapy: Secondary | ICD-10-CM

## 2018-03-05 DIAGNOSIS — J301 Allergic rhinitis due to pollen: Secondary | ICD-10-CM | POA: Diagnosis not present

## 2018-03-05 DIAGNOSIS — C9000 Multiple myeloma not having achieved remission: Secondary | ICD-10-CM

## 2018-03-05 DIAGNOSIS — M549 Dorsalgia, unspecified: Secondary | ICD-10-CM

## 2018-03-05 DIAGNOSIS — Z792 Long term (current) use of antibiotics: Secondary | ICD-10-CM | POA: Diagnosis not present

## 2018-03-05 DIAGNOSIS — J069 Acute upper respiratory infection, unspecified: Secondary | ICD-10-CM | POA: Diagnosis not present

## 2018-03-05 DIAGNOSIS — D701 Agranulocytosis secondary to cancer chemotherapy: Secondary | ICD-10-CM | POA: Diagnosis not present

## 2018-03-05 DIAGNOSIS — R040 Epistaxis: Secondary | ICD-10-CM | POA: Diagnosis not present

## 2018-03-05 DIAGNOSIS — D709 Neutropenia, unspecified: Secondary | ICD-10-CM | POA: Diagnosis not present

## 2018-03-05 DIAGNOSIS — Z5111 Encounter for antineoplastic chemotherapy: Secondary | ICD-10-CM | POA: Diagnosis not present

## 2018-03-05 DIAGNOSIS — N189 Chronic kidney disease, unspecified: Secondary | ICD-10-CM

## 2018-03-05 DIAGNOSIS — R21 Rash and other nonspecific skin eruption: Secondary | ICD-10-CM | POA: Diagnosis not present

## 2018-03-05 DIAGNOSIS — D649 Anemia, unspecified: Secondary | ICD-10-CM

## 2018-03-05 DIAGNOSIS — D696 Thrombocytopenia, unspecified: Secondary | ICD-10-CM | POA: Diagnosis not present

## 2018-03-05 DIAGNOSIS — G893 Neoplasm related pain (acute) (chronic): Secondary | ICD-10-CM

## 2018-03-05 DIAGNOSIS — L299 Pruritus, unspecified: Secondary | ICD-10-CM | POA: Diagnosis not present

## 2018-03-05 DIAGNOSIS — T451X5S Adverse effect of antineoplastic and immunosuppressive drugs, sequela: Secondary | ICD-10-CM | POA: Diagnosis not present

## 2018-03-05 DIAGNOSIS — R509 Fever, unspecified: Secondary | ICD-10-CM | POA: Diagnosis not present

## 2018-03-05 LAB — CBC WITH DIFFERENTIAL (CANCER CENTER ONLY)
BASOS ABS: 0.1 10*3/uL (ref 0.0–0.1)
Basophils Relative: 4 %
Eosinophils Absolute: 0.1 10*3/uL (ref 0.0–0.5)
Eosinophils Relative: 4 %
HEMATOCRIT: 28.6 % — AB (ref 38.4–49.9)
Hemoglobin: 9.7 g/dL — ABNORMAL LOW (ref 13.0–17.1)
Lymphocytes Relative: 30 %
Lymphs Abs: 0.7 10*3/uL — ABNORMAL LOW (ref 0.9–3.3)
MCH: 33 pg (ref 27.2–33.4)
MCHC: 33.9 g/dL (ref 32.0–36.0)
MCV: 97.3 fL (ref 79.3–98.0)
Monocytes Absolute: 0.5 10*3/uL (ref 0.1–0.9)
Monocytes Relative: 20 %
NEUTROS ABS: 1 10*3/uL — AB (ref 1.5–6.5)
NEUTROS PCT: 42 %
PLATELETS: 255 10*3/uL (ref 140–400)
RBC: 2.94 MIL/uL — AB (ref 4.20–5.82)
RDW: 15.7 % — ABNORMAL HIGH (ref 11.0–14.6)
WBC: 2.3 10*3/uL — AB (ref 4.0–10.3)

## 2018-03-05 LAB — CMP (CANCER CENTER ONLY)
ALT: 30 U/L (ref 0–55)
AST: 19 U/L (ref 5–34)
Albumin: 4 g/dL (ref 3.5–5.0)
Alkaline Phosphatase: 118 U/L (ref 40–150)
Anion gap: 10 (ref 3–11)
BILIRUBIN TOTAL: 0.3 mg/dL (ref 0.2–1.2)
BUN: 28 mg/dL — AB (ref 7–26)
CHLORIDE: 107 mmol/L (ref 98–109)
CO2: 24 mmol/L (ref 22–29)
CREATININE: 1.38 mg/dL — AB (ref 0.70–1.30)
Calcium: 10.3 mg/dL (ref 8.4–10.4)
GFR, Est AFR Am: >60 mL/min (ref >60)
GFR, Estimated: 55 mL/min — ABNORMAL LOW (ref >60)
Glucose, Bld: 104 mg/dL (ref 70–140)
Potassium: 3.7 mmol/L (ref 3.5–5.1)
Sodium: 141 mmol/L (ref 136–145)
Total Protein: 6.9 g/dL (ref 6.4–8.3)

## 2018-03-05 MED ORDER — PROCHLORPERAZINE MALEATE 10 MG PO TABS
ORAL_TABLET | ORAL | Status: AC
  Filled 2018-03-05: qty 1

## 2018-03-05 MED ORDER — DEXAMETHASONE 4 MG PO TABS
40.0000 mg | ORAL_TABLET | ORAL | Status: DC
Start: 2018-03-05 — End: 2018-03-05
  Administered 2018-03-05: 40 mg via ORAL

## 2018-03-05 MED ORDER — PROCHLORPERAZINE MALEATE 10 MG PO TABS
10.0000 mg | ORAL_TABLET | Freq: Once | ORAL | Status: AC
  Administered 2018-03-05: 10 mg via ORAL

## 2018-03-05 MED ORDER — BORTEZOMIB CHEMO SQ INJECTION 3.5 MG (2.5MG/ML)
0.9000 mg/m2 | Freq: Once | INTRAMUSCULAR | Status: AC
  Administered 2018-03-05: 1.75 mg via SUBCUTANEOUS
  Filled 2018-03-05: qty 1.75

## 2018-03-05 MED ORDER — ZOLEDRONIC ACID 4 MG/100ML IV SOLN
4.0000 mg | Freq: Once | INTRAVENOUS | Status: AC
  Administered 2018-03-05: 4 mg via INTRAVENOUS
  Filled 2018-03-05: qty 100

## 2018-03-05 MED ORDER — DEXAMETHASONE 4 MG PO TABS
ORAL_TABLET | ORAL | Status: AC
Start: 2018-03-05 — End: ?
  Filled 2018-03-05: qty 10

## 2018-03-05 NOTE — Progress Notes (Signed)
Per Dr. Benay Spice, [facility] to treat with [place] of 1.0.

## 2018-03-05 NOTE — Telephone Encounter (Signed)
Changed patient appointment t9o the requested date and time per 4/30 los

## 2018-03-05 NOTE — Progress Notes (Signed)
  [place] OFFICE PROGRESS NOTE   Diagnosis: Multiple myeloma  INTERVAL HISTORY:   Paul Green returns as scheduled.  He denies significant pain.  He notes a "tightness" at the low back and hips.  Energy level is better.  Appetite has improved. No nausea or vomiting.  No diarrhea or constipation.  No rash except a small area of redness following a Velcade injection. He had a single episode of numbness/tingling in his hands about a week ago.  Objective:  Vital signs in last 24 hours:  Blood pressure (!) 130/94, pulse (!) 104, temperature 97.8 F (36.6 C), temperature source Oral, resp. rate 20, height _0  (1.676 m), weight 179 lb (81.2 kg), SpO2 100 %.    HEENT: No thrush or ulcers.  Resp: Lungs clear bilaterally. Cardio: Regular rate and rhythm. GI: Abdomen soft and nontender.  No hepatomegaly. Vascular: No leg edema.  Calves are soft and nontender. Neuro: Alert and oriented.    Lab Results:  Lab Results  Component Value Date   WBC 2.3 (L) 03/05/2018   HGB 9.7 (L) 03/05/2018   HCT 28.6 (L) 03/05/2018   MCV 97.3 03/05/2018   PLT 255 03/05/2018   NEUTROABS 1.0 (L) 03/05/2018    Imaging:  No results found.  Medications: I have reviewed the patient's current medications.  Assessment/Plan: 1. Multiple myeloma   Anemia/thrombocytopenia  Bone marrow biopsy 01/25/2018-hypercellular bone marrow with extensive involvement by plasma cell neoplasm; flow cytometry with a population of cells with plasmacytic differentiation (13%), no monoclonal B-cell population identified, T cells with nonspecific changes.  Cytogenetics- multiple rearrangements, FISH panel negative for cytogenetic abnormalities to be associated with multiple myeloma including loss of p53  01/24/2018 SPEP-noM spike,elevated serum lambda free light chains (432)  Dexamethasone 40 mg daily times 4 days beginning 01/25/2018  Velcade/dexamethasone weekly x3 followed by a 1 week break  beginning3/26/2019(weekly dexamethasone initiated 02/05/2018, Revlimid initiated 02/09/2018)  Lambda light chains improved 02/26/2018  Cycle 2 RVD 03/05/2018 (Velcade dose reduced due to neutropenia) 2. Hypercalcemia status post pamidronate 01/24/2018, resolved 3. Diffuse abnormal bone marrow signal, compression fracture of L1 4. Back painsecondary to multiple myeloma, compression fracture.Improved. 5. Anorexia/weight loss-improved 6. 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 Severe neutropenia 02/26/2018-placed on Levaquin prophylaxis     Disposition: Paul Green appears stable.  He has completed 1 cycle of RVD.  Cycle 2 has been on hold due to neutropenia.  The absolute neutrophil count has improved.  Plan to proceed with cycle 2 of RVD today as scheduled.  Velcade will be dose reduced.  He will return for a follow-up CBC on 03/08/2018.  He will return for a follow-up visit on 03/13/2018.  He will contact the office in the interim with any problems.  Patient seen with Dr. Benay Spice.    Ned Card ANP/GNP-BC   03/05/2018  2:50 PM  This was a shared visit with Ned Card.  Paul Green appears well.  The white count is recovering.  There is been significant clinical and laboratory improvement with the first cycle of RVD.  The plan is to resume chemotherapy today.  He continues to have mild neutropenia.  We will dose reduce the Velcade and he will return for a follow-up CBC on 03/08/2018. He will return for an office visit in the next treatment of Velcade on 03/13/2018.  Julieanne Manson, MD

## 2018-03-05 NOTE — Patient Instructions (Signed)
[facility] Discharge Instructions for Patients Receiving Chemotherapy  Today you received the following chemotherapy agents: Velcade and Zometa  To help prevent nausea and vomiting after your treatment, we encourage you to take your nausea medication as directed.   If you develop nausea and vomiting that is not controlled by your nausea medication, call the clinic.   BELOW ARE SYMPTOMS THAT SHOULD BE REPORTED IMMEDIATELY:  *FEVER GREATER THAN 100.5 F  *CHILLS WITH OR WITHOUT FEVER  NAUSEA AND VOMITING THAT IS NOT CONTROLLED WITH YOUR NAUSEA MEDICATION  *UNUSUAL SHORTNESS OF BREATH  *UNUSUAL BRUISING OR BLEEDING  TENDERNESS IN MOUTH AND THROAT WITH OR WITHOUT PRESENCE OF ULCERS  *URINARY PROBLEMS  *BOWEL PROBLEMS  UNUSUAL RASH Items with * indicate a potential emergency and should be followed up as soon as possible.  Feel free to call the clinic should you have any questions or concerns. The clinic phone number is [phone].  Please show the CHEMO ALERT CARD at check-in to the Emergency Department and triage nurse.   

## 2018-03-07 ENCOUNTER — Telehealth: Payer: Self-pay | Admitting: Oncology

## 2018-03-07 NOTE — Telephone Encounter (Signed)
Scheduled appt per 5/1 sch message - pt is aware of appt date and time. Change.

## 2018-03-08 ENCOUNTER — Inpatient Hospital Stay: Payer: BLUE CROSS/BLUE SHIELD | Attending: Oncology

## 2018-03-08 ENCOUNTER — Telehealth: Payer: Self-pay | Admitting: *Deleted

## 2018-03-08 DIAGNOSIS — D696 Thrombocytopenia, unspecified: Secondary | ICD-10-CM | POA: Diagnosis not present

## 2018-03-08 DIAGNOSIS — N19 Unspecified kidney failure: Secondary | ICD-10-CM | POA: Diagnosis not present

## 2018-03-08 DIAGNOSIS — M549 Dorsalgia, unspecified: Secondary | ICD-10-CM | POA: Diagnosis not present

## 2018-03-08 DIAGNOSIS — D709 Neutropenia, unspecified: Secondary | ICD-10-CM | POA: Diagnosis not present

## 2018-03-08 DIAGNOSIS — T451X5S Adverse effect of antineoplastic and immunosuppressive drugs, sequela: Secondary | ICD-10-CM | POA: Insufficient documentation

## 2018-03-08 DIAGNOSIS — D649 Anemia, unspecified: Secondary | ICD-10-CM | POA: Insufficient documentation

## 2018-03-08 DIAGNOSIS — C9000 Multiple myeloma not having achieved remission: Secondary | ICD-10-CM | POA: Insufficient documentation

## 2018-03-08 DIAGNOSIS — D701 Agranulocytosis secondary to cancer chemotherapy: Secondary | ICD-10-CM | POA: Diagnosis not present

## 2018-03-08 DIAGNOSIS — M545 Low back pain: Secondary | ICD-10-CM | POA: Insufficient documentation

## 2018-03-08 LAB — CBC WITH DIFFERENTIAL (CANCER CENTER ONLY)
BASOS ABS: 0.1 10*3/uL (ref 0.0–0.1)
Basophils Relative: 3 %
EOS PCT: 7 %
Eosinophils Absolute: 0.2 10*3/uL (ref 0.0–0.5)
HCT: 27.8 % — ABNORMAL LOW (ref 38.4–49.9)
Hemoglobin: 9.4 g/dL — ABNORMAL LOW (ref 13.0–17.1)
Lymphocytes Relative: 15 %
Lymphs Abs: 0.4 10*3/uL — ABNORMAL LOW (ref 0.9–3.3)
MCH: 33.1 pg (ref 27.2–33.4)
MCHC: 33.7 g/dL (ref 32.0–36.0)
MCV: 98.3 fL — AB (ref 79.3–98.0)
MONO ABS: 0.1 10*3/uL (ref 0.1–0.9)
Monocytes Relative: 5 %
Neutro Abs: 2 10*3/uL (ref 1.5–6.5)
Neutrophils Relative %: 70 %
PLATELETS: 234 10*3/uL (ref 140–400)
RBC: 2.83 MIL/uL — ABNORMAL LOW (ref 4.20–5.82)
RDW: 17.3 % — AB (ref 11.0–14.6)
WBC Count: 2.8 10*3/uL — ABNORMAL LOW (ref 4.0–10.3)

## 2018-03-08 NOTE — Telephone Encounter (Signed)
Call from pt requesting lab result. Informed him of stable HGB, WBC look better, neutrophils are normal. He voiced understanding.

## 2018-03-10 ENCOUNTER — Other Ambulatory Visit: Payer: Self-pay | Admitting: Oncology

## 2018-03-11 ENCOUNTER — Other Ambulatory Visit: Payer: BLUE CROSS/BLUE SHIELD

## 2018-03-11 ENCOUNTER — Ambulatory Visit: Payer: BLUE CROSS/BLUE SHIELD

## 2018-03-12 ENCOUNTER — Other Ambulatory Visit: Payer: Self-pay

## 2018-03-12 ENCOUNTER — Telehealth: Payer: Self-pay | Admitting: Medical Oncology

## 2018-03-12 DIAGNOSIS — D709 Neutropenia, unspecified: Secondary | ICD-10-CM | POA: Diagnosis not present

## 2018-03-12 DIAGNOSIS — Z9484 Stem cells transplant status: Secondary | ICD-10-CM | POA: Diagnosis not present

## 2018-03-12 DIAGNOSIS — Z7982 Long term (current) use of aspirin: Secondary | ICD-10-CM | POA: Diagnosis not present

## 2018-03-12 DIAGNOSIS — R197 Diarrhea, unspecified: Secondary | ICD-10-CM | POA: Diagnosis not present

## 2018-03-12 DIAGNOSIS — R630 Anorexia: Secondary | ICD-10-CM | POA: Diagnosis not present

## 2018-03-12 DIAGNOSIS — M549 Dorsalgia, unspecified: Secondary | ICD-10-CM | POA: Diagnosis not present

## 2018-03-12 DIAGNOSIS — R5081 Fever presenting with conditions classified elsewhere: Secondary | ICD-10-CM | POA: Diagnosis not present

## 2018-03-12 DIAGNOSIS — R112 Nausea with vomiting, unspecified: Secondary | ICD-10-CM | POA: Diagnosis not present

## 2018-03-12 DIAGNOSIS — C9000 Multiple myeloma not having achieved remission: Secondary | ICD-10-CM | POA: Diagnosis not present

## 2018-03-12 DIAGNOSIS — R5383 Other fatigue: Secondary | ICD-10-CM | POA: Diagnosis not present

## 2018-03-12 DIAGNOSIS — Z6829 Body mass index (BMI) 29.0-29.9, adult: Secondary | ICD-10-CM | POA: Diagnosis not present

## 2018-03-12 DIAGNOSIS — Z7682 Awaiting organ transplant status: Secondary | ICD-10-CM | POA: Diagnosis not present

## 2018-03-12 NOTE — Telephone Encounter (Signed)
Expecting a refill for pt revlimid. Please refill or contact pharmacy for concerns.

## 2018-03-13 ENCOUNTER — Telehealth: Payer: Self-pay

## 2018-03-13 ENCOUNTER — Inpatient Hospital Stay: Payer: BLUE CROSS/BLUE SHIELD

## 2018-03-13 ENCOUNTER — Encounter: Payer: Self-pay | Admitting: Oncology

## 2018-03-13 ENCOUNTER — Ambulatory Visit: Payer: BLUE CROSS/BLUE SHIELD

## 2018-03-13 ENCOUNTER — Ambulatory Visit: Payer: BLUE CROSS/BLUE SHIELD | Admitting: Nurse Practitioner

## 2018-03-13 ENCOUNTER — Other Ambulatory Visit: Payer: BLUE CROSS/BLUE SHIELD

## 2018-03-13 ENCOUNTER — Ambulatory Visit: Payer: BLUE CROSS/BLUE SHIELD | Admitting: Oncology

## 2018-03-13 ENCOUNTER — Inpatient Hospital Stay (HOSPITAL_BASED_OUTPATIENT_CLINIC_OR_DEPARTMENT_OTHER): Payer: BLUE CROSS/BLUE SHIELD | Admitting: Oncology

## 2018-03-13 VITALS — BP 135/92 | HR 88 | Temp 98.0°F | Resp 18 | Ht 66.0 in | Wt 177.6 lb

## 2018-03-13 DIAGNOSIS — D649 Anemia, unspecified: Secondary | ICD-10-CM

## 2018-03-13 DIAGNOSIS — N19 Unspecified kidney failure: Secondary | ICD-10-CM | POA: Diagnosis not present

## 2018-03-13 DIAGNOSIS — D709 Neutropenia, unspecified: Secondary | ICD-10-CM | POA: Diagnosis not present

## 2018-03-13 DIAGNOSIS — T451X5S Adverse effect of antineoplastic and immunosuppressive drugs, sequela: Secondary | ICD-10-CM | POA: Diagnosis not present

## 2018-03-13 DIAGNOSIS — M549 Dorsalgia, unspecified: Secondary | ICD-10-CM

## 2018-03-13 DIAGNOSIS — C9000 Multiple myeloma not having achieved remission: Secondary | ICD-10-CM

## 2018-03-13 DIAGNOSIS — D701 Agranulocytosis secondary to cancer chemotherapy: Secondary | ICD-10-CM | POA: Diagnosis not present

## 2018-03-13 DIAGNOSIS — D696 Thrombocytopenia, unspecified: Secondary | ICD-10-CM | POA: Diagnosis not present

## 2018-03-13 DIAGNOSIS — M545 Low back pain: Secondary | ICD-10-CM | POA: Diagnosis not present

## 2018-03-13 LAB — CBC WITH DIFFERENTIAL (CANCER CENTER ONLY)
BASOS ABS: 0 10*3/uL (ref 0.0–0.1)
Basophils Relative: 4 %
EOS ABS: 0.2 10*3/uL (ref 0.0–0.5)
EOS PCT: 18 %
HCT: 30 % — ABNORMAL LOW (ref 38.4–49.9)
Hemoglobin: 10 g/dL — ABNORMAL LOW (ref 13.0–17.1)
Lymphocytes Relative: 33 %
Lymphs Abs: 0.3 10*3/uL — ABNORMAL LOW (ref 0.9–3.3)
MCH: 32.5 pg (ref 27.2–33.4)
MCHC: 33.3 g/dL (ref 32.0–36.0)
MCV: 97.4 fL (ref 79.3–98.0)
Monocytes Absolute: 0.2 10*3/uL (ref 0.1–0.9)
Monocytes Relative: 22 %
Neutro Abs: 0.2 10*3/uL — CL (ref 1.5–6.5)
Neutrophils Relative %: 23 %
PLATELETS: 182 10*3/uL (ref 140–400)
RBC: 3.08 MIL/uL — AB (ref 4.20–5.82)
RDW: 16.3 % — ABNORMAL HIGH (ref 11.0–14.6)
WBC: 1.1 10*3/uL — AB (ref 4.0–10.3)

## 2018-03-13 LAB — CMP (CANCER CENTER ONLY)
ALT: 19 U/L (ref 0–55)
AST: 15 U/L (ref 5–34)
Albumin: 4 g/dL (ref 3.5–5.0)
Alkaline Phosphatase: 96 U/L (ref 40–150)
Anion gap: 8 (ref 3–11)
BILIRUBIN TOTAL: 0.6 mg/dL (ref 0.2–1.2)
BUN: 15 mg/dL (ref 7–26)
CO2: 23 mmol/L (ref 22–29)
CREATININE: 1.14 mg/dL (ref 0.70–1.30)
Calcium: 8.6 mg/dL (ref 8.4–10.4)
Chloride: 109 mmol/L (ref 98–109)
GFR, Est AFR Am: >60 mL/min (ref >60)
Glucose, Bld: 111 mg/dL (ref 70–140)
Potassium: 3.6 mmol/L (ref 3.5–5.1)
Sodium: 140 mmol/L (ref 136–145)
TOTAL PROTEIN: 6.7 g/dL (ref 6.4–8.3)

## 2018-03-13 MED ORDER — DEXAMETHASONE 4 MG PO TABS
40.0000 mg | ORAL_TABLET | ORAL | Status: DC
  Administered 2018-03-13: 40 mg via ORAL

## 2018-03-13 MED ORDER — DEXAMETHASONE 4 MG PO TABS
ORAL_TABLET | ORAL | Status: AC
  Filled 2018-03-13: qty 10

## 2018-03-13 NOTE — Telephone Encounter (Signed)
Printed avs and calender of upcoming appointment. Per 5/8 los 

## 2018-03-13 NOTE — Progress Notes (Signed)
[place] OFFICE PROGRESS NOTE   Diagnosis: Multiple myeloma  INTERVAL HISTORY:   Mr. Danzer returns as scheduled.  He started another cycle of RVD on 03/05/2018.  The back pain remains improved.  He has mild discomfort at the left lateral chest wall.  He saw Dr. Ok Edwards yesterday.  Dr. Ok Edwards recommends continuing RVD for 4 cycles to be followed by stem cell therapy and then a maintenance regimen.  Mr. Gehres and his wife are contemplating whether to proceed with stem cell therapy.  Objective:  Vital signs in last 24 hours:  Blood pressure (!) 135/92, pulse 88, temperature 98 F (36.7 C), temperature source Oral, resp. rate 18, height '5\' 6"'  (1.676 m), weight 177 lb 9.6 oz (80.6 kg), SpO2 100 %.    HEENT: No thrush or ulcers Resp: Lungs clear bilaterally Cardio: Regular rate and rhythm GI: No hepatosplenomegaly  vascular: No leg edema Musculoskeletal: No tenderness at the left chest wall.   Lab Results:  Lab Results  Component Value Date   WBC 1.1 (L) 03/13/2018   HGB 10.0 (L) 03/13/2018   HCT 30.0 (L) 03/13/2018   MCV 97.4 03/13/2018   PLT 182 03/13/2018   NEUTROABS 0.2 (LL) 03/13/2018    CMP     Component Value Date/Time   NA 140 03/13/2018 0745   K 3.6 03/13/2018 0745   CL 109 03/13/2018 0745   CO2 23 03/13/2018 0745   GLUCOSE 111 03/13/2018 0745   BUN 15 03/13/2018 0745   CREATININE 1.14 03/13/2018 0745   CALCIUM 8.6 03/13/2018 0745   PROT 6.7 03/13/2018 0745   ALBUMIN 4.0 03/13/2018 0745   AST 15 03/13/2018 0745   ALT 19 03/13/2018 0745   ALKPHOS 96 03/13/2018 0745   BILITOT 0.6 03/13/2018 0745   GFRNONAA >60 03/13/2018 0745   GFRAA >60 03/13/2018 0745     Medications: I have reviewed the patient's current medications.   Assessment/Plan: 1. Multiple myeloma   Anemia/thrombocytopenia  Bone marrow biopsy 01/25/2018-hypercellular bone marrow with extensive involvement by plasma cell neoplasm; flow cytometry with a population of cells  with plasmacytic differentiation (13%), no monoclonal B-cell population identified, T cells with nonspecific changes.  Cytogenetics-multiple rearrangements, FISH panel negative for cytogenetic abnormalities to be associated with multiple myeloma including loss of p53  01/24/2018 SPEP-noM spike,elevated serum lambda free light chains (432)  Dexamethasone 40 mg daily times 4 days beginning 01/25/2018  Velcade/dexamethasone weekly x3 followed by a 1 week break beginning3/26/2019(weekly dexamethasone initiated 02/05/2018, Revlimid initiated 02/09/2018)  Lambda light chains improved 02/26/2018  Cycle 2 RVD 03/05/2018 (Velcade dose reduced due to neutropenia), day 8 Velcade held and Revlimid discontinued secondary to recurrent severe neutropenia 2. Hypercalcemia status post pamidronate 01/24/2018, resolved 3. Diffuse abnormal bone marrow signal, compression fracture of L1 4. Back painsecondary to multiple myeloma, compression fracture.Improved. 5. Anorexia/weight loss-improved 6. 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 Severe neutropenia 02/26/2018 and 03/13/2018    Disposition: Mr. Ingrum appears stable.  He is now at day 8 cycle 2 RVD.  He again has severe neutropenia.  We placed Velcade and Revlimid on hold.  He will return for a CBC on 03/15/2018.  He knows to contact us for a fever or symptoms of an infection.  The neutropenia is likely secondary to bone marrow toxicity in the setting of extensive involvement by multiple myeloma.  His clinical status and the serum light chains have improved.  He will continue weekly Decadron.  Mr. Shular will be scheduled for an  office visit and the next treatment with Velcade in 1 week.  We discussed the indication for stem cell therapy in patients with myeloma.  He and his wife will contemplate this over the next few months.  We will refer him to Dr. Amalia Hailey to further discuss the indication for stem cell therapy and to  recommend a maintenance regimen.  25 minutes were spent with the patient today.  The majority of the time was used for counseling and coordination of care.  Betsy Coder, MD  03/13/2018  9:08 AM

## 2018-03-15 ENCOUNTER — Inpatient Hospital Stay: Payer: BLUE CROSS/BLUE SHIELD

## 2018-03-15 ENCOUNTER — Telehealth: Payer: Self-pay

## 2018-03-15 DIAGNOSIS — N19 Unspecified kidney failure: Secondary | ICD-10-CM | POA: Diagnosis not present

## 2018-03-15 DIAGNOSIS — M545 Low back pain: Secondary | ICD-10-CM | POA: Diagnosis not present

## 2018-03-15 DIAGNOSIS — C9000 Multiple myeloma not having achieved remission: Secondary | ICD-10-CM

## 2018-03-15 DIAGNOSIS — M549 Dorsalgia, unspecified: Secondary | ICD-10-CM | POA: Diagnosis not present

## 2018-03-15 LAB — CBC WITH DIFFERENTIAL (CANCER CENTER ONLY)
Basophils Absolute: 0 10*3/uL (ref 0.0–0.1)
Basophils Relative: 1 %
Eosinophils Absolute: 0 10*3/uL (ref 0.0–0.5)
Eosinophils Relative: 1 %
HEMATOCRIT: 30.1 % — AB (ref 38.4–49.9)
Hemoglobin: 9.9 g/dL — ABNORMAL LOW (ref 13.0–17.1)
LYMPHS ABS: 0.8 10*3/uL — AB (ref 0.9–3.3)
LYMPHS PCT: 36 %
MCH: 32.5 pg (ref 27.2–33.4)
MCHC: 32.9 g/dL (ref 32.0–36.0)
MCV: 98.7 fL — AB (ref 79.3–98.0)
MONOS PCT: 30 %
Monocytes Absolute: 0.6 10*3/uL (ref 0.1–0.9)
Neutro Abs: 0.6 10*3/uL — ABNORMAL LOW (ref 1.5–6.5)
Neutrophils Relative %: 32 %
Platelet Count: 185 10*3/uL (ref 140–400)
RBC: 3.05 MIL/uL — ABNORMAL LOW (ref 4.20–5.82)
RDW: 15.6 % — AB (ref 11.0–14.6)
WBC Count: 2 10*3/uL — ABNORMAL LOW (ref 4.0–10.3)

## 2018-03-15 NOTE — Telephone Encounter (Addendum)
Pt voiced understanding of message below  ----- Message from [name], MD sent at 03/15/2018  2:24 PM EDT ----- Please call patient, the neutrophils are better, remain off of Revlimid, follow-up as scheduled, call for fever

## 2018-03-17 ENCOUNTER — Other Ambulatory Visit: Payer: Self-pay | Admitting: Oncology

## 2018-03-19 ENCOUNTER — Telehealth: Payer: Self-pay

## 2018-03-19 ENCOUNTER — Inpatient Hospital Stay (HOSPITAL_BASED_OUTPATIENT_CLINIC_OR_DEPARTMENT_OTHER): Payer: BLUE CROSS/BLUE SHIELD | Admitting: Nurse Practitioner

## 2018-03-19 ENCOUNTER — Inpatient Hospital Stay: Payer: BLUE CROSS/BLUE SHIELD

## 2018-03-19 ENCOUNTER — Encounter: Payer: Self-pay | Admitting: Nurse Practitioner

## 2018-03-19 VITALS — BP 151/98 | HR 70 | Temp 97.7°F | Resp 19 | Ht 66.0 in | Wt 177.2 lb

## 2018-03-19 DIAGNOSIS — D649 Anemia, unspecified: Secondary | ICD-10-CM

## 2018-03-19 DIAGNOSIS — D696 Thrombocytopenia, unspecified: Secondary | ICD-10-CM

## 2018-03-19 DIAGNOSIS — T451X5S Adverse effect of antineoplastic and immunosuppressive drugs, sequela: Secondary | ICD-10-CM

## 2018-03-19 DIAGNOSIS — C9000 Multiple myeloma not having achieved remission: Secondary | ICD-10-CM

## 2018-03-19 DIAGNOSIS — D701 Agranulocytosis secondary to cancer chemotherapy: Secondary | ICD-10-CM | POA: Diagnosis not present

## 2018-03-19 DIAGNOSIS — N189 Chronic kidney disease, unspecified: Secondary | ICD-10-CM

## 2018-03-19 DIAGNOSIS — M545 Low back pain: Secondary | ICD-10-CM

## 2018-03-19 DIAGNOSIS — M549 Dorsalgia, unspecified: Secondary | ICD-10-CM | POA: Diagnosis not present

## 2018-03-19 DIAGNOSIS — D709 Neutropenia, unspecified: Secondary | ICD-10-CM | POA: Diagnosis not present

## 2018-03-19 DIAGNOSIS — N19 Unspecified kidney failure: Secondary | ICD-10-CM | POA: Diagnosis not present

## 2018-03-19 LAB — CBC WITH DIFFERENTIAL (CANCER CENTER ONLY)
Basophils Absolute: 0 10*3/uL (ref 0.0–0.1)
Basophils Relative: 1 %
EOS PCT: 4 %
Eosinophils Absolute: 0.1 10*3/uL (ref 0.0–0.5)
HEMATOCRIT: 33.1 % — AB (ref 38.4–49.9)
Hemoglobin: 11.2 g/dL — ABNORMAL LOW (ref 13.0–17.1)
LYMPHS ABS: 0.7 10*3/uL — AB (ref 0.9–3.3)
LYMPHS PCT: 28 %
MCH: 33.1 pg (ref 27.2–33.4)
MCHC: 33.9 g/dL (ref 32.0–36.0)
MCV: 97.6 fL (ref 79.3–98.0)
MONO ABS: 0.7 10*3/uL (ref 0.1–0.9)
MONOS PCT: 26 %
NEUTROS ABS: 1 10*3/uL — AB (ref 1.5–6.5)
Neutrophils Relative %: 41 %
Platelet Count: 206 10*3/uL (ref 140–400)
RBC: 3.4 MIL/uL — ABNORMAL LOW (ref 4.20–5.82)
RDW: 16.3 % — ABNORMAL HIGH (ref 11.0–14.6)
WBC Count: 2.6 10*3/uL — ABNORMAL LOW (ref 4.0–10.3)

## 2018-03-19 LAB — CMP (CANCER CENTER ONLY)
ALT: 23 U/L (ref 0–55)
AST: 16 U/L (ref 5–34)
Albumin: 4.3 g/dL (ref 3.5–5.0)
Alkaline Phosphatase: 86 U/L (ref 40–150)
Anion gap: 7 (ref 3–11)
BILIRUBIN TOTAL: 0.5 mg/dL (ref 0.2–1.2)
BUN: 19 mg/dL (ref 7–26)
CO2: 27 mmol/L (ref 22–29)
CREATININE: 1.19 mg/dL (ref 0.70–1.30)
Calcium: 10.1 mg/dL (ref 8.4–10.4)
Chloride: 107 mmol/L (ref 98–109)
Glucose, Bld: 86 mg/dL (ref 70–140)
Potassium: 4.4 mmol/L (ref 3.5–5.1)
Sodium: 141 mmol/L (ref 136–145)
Total Protein: 6.8 g/dL (ref 6.4–8.3)

## 2018-03-19 MED ORDER — DEXAMETHASONE 4 MG PO TABS
40.0000 mg | ORAL_TABLET | Freq: Once | ORAL | Status: AC
  Administered 2018-03-19: 40 mg via ORAL

## 2018-03-19 MED ORDER — DEXAMETHASONE 4 MG PO TABS
ORAL_TABLET | ORAL | Status: AC
  Filled 2018-03-19: qty 10

## 2018-03-19 NOTE — Telephone Encounter (Signed)
Printed avs and calender of upcoming appointment. Per 5/14 los 

## 2018-03-19 NOTE — Progress Notes (Signed)
  [place] OFFICE PROGRESS NOTE   Diagnosis: Multiple myeloma  INTERVAL HISTORY:   Mr. Nanna returns as scheduled.  He began cycle 2 RVD 03/05/2018.  On day 8 Velcade and Revlimid were placed on hold due to severe neutropenia.  He feels well.  No fevers or shaking chills.  He denies significant pain.  He has "tightness" at the low back.  No nausea or vomiting.  No constipation or diarrhea.  He has a good appetite.  He is walking a mile and a half a day.  Objective:  Vital signs in last 24 hours:  Blood pressure (!) 151/98, pulse 70, temperature 97.7 F (36.5 C), temperature source Oral, resp. rate 19, height _0  (1.676 m), weight 177 lb 3.2 oz (80.4 kg), SpO2 100 %.    HEENT: No thrush or ulcers. Resp: Lungs clear bilaterally. Cardio: Regular rate and rhythm. GI: Abdomen soft and nontender.  No hepatomegaly. Vascular: No leg edema.  Calves soft and nontender.    Lab Results:  Lab Results  Component Value Date   WBC 2.6 (L) 03/19/2018   HGB 11.2 (L) 03/19/2018   HCT 33.1 (L) 03/19/2018   MCV 97.6 03/19/2018   PLT 206 03/19/2018   NEUTROABS 1.0 (L) 03/19/2018    Imaging:  No results found.  Medications: I have reviewed the patient's current medications.  Assessment/Plan: 1. Multiple myeloma   Anemia/thrombocytopenia  Bone marrow biopsy 01/25/2018-hypercellular bone marrow with extensive involvement by plasma cell neoplasm; flow cytometry with a population of cells with plasmacytic differentiation (13%), no monoclonal B-cell population identified, T cells with nonspecific changes.  Cytogenetics-multiple rearrangements, FISH panel negative for cytogenetic abnormalities to be associated with multiple myeloma including loss of p53  01/24/2018 SPEP-noM spike,elevated serum lambda free light chains (432)  Dexamethasone 40 mg daily times 4 days beginning 01/25/2018  Velcade/dexamethasone weekly x3 followed by a 1 week break  beginning3/26/2019(weekly dexamethasone initiated 02/05/2018, Revlimid initiated 02/09/2018)  Lambda light chains improved 02/26/2018  Cycle 2 RVD 03/05/2018 (Velcade dose reduced due to neutropenia), day 8 Velcade held and Revlimid discontinued secondary to recurrent severe neutropenia; day 15 Revlimid resumed x1 week, Velcade held 2. Hypercalcemia status post pamidronate 01/24/2018, resolved 3. Diffuse abnormal bone marrow signal, compression fracture of L1 4. Back painsecondary to multiple myeloma, compression fracture.Improved. 5. Anorexia/weight loss-improved 6. 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 Severe neutropenia 02/26/2018 and 03/13/2018; improved 03/19/2018     Disposition: Mr. Herberger appears stable.  He began cycle 2 RVD 03/05/2018.  Velcade and Revlimid were placed on hold day 8 due to severe neutropenia.  The neutrophil count is recovering.  Dr. Benay Spice recommends resuming Revlimid x1 week, continue to hold Velcade, continue weekly dexamethasone.  He will return for a follow-up CBC in 1 week.  We will see him in a follow-up visit in 2 weeks.  He will contact the office in the interim with any problems.  Patient seen with Dr. Benay Spice.    Ned Card ANP/GNP-BC   03/19/2018  11:25 AM  This was a shared visit with Ned Card.  Mr. Cansler appears well.  The white count has partially recovered.  It is unclear whether the severe neutropenia was related to Velcade or Revlimid.  We decided to hold Velcade and resume Revlimid.  He will return for a CBC in 1 week.  Julieanne Manson, MD

## 2018-03-20 ENCOUNTER — Other Ambulatory Visit: Payer: BLUE CROSS/BLUE SHIELD

## 2018-03-20 ENCOUNTER — Ambulatory Visit: Payer: BLUE CROSS/BLUE SHIELD

## 2018-03-24 ENCOUNTER — Other Ambulatory Visit: Payer: Self-pay | Admitting: Oncology

## 2018-03-26 ENCOUNTER — Telehealth: Payer: Self-pay

## 2018-03-26 ENCOUNTER — Inpatient Hospital Stay: Payer: BLUE CROSS/BLUE SHIELD

## 2018-03-26 DIAGNOSIS — M545 Low back pain: Secondary | ICD-10-CM | POA: Diagnosis not present

## 2018-03-26 DIAGNOSIS — M549 Dorsalgia, unspecified: Secondary | ICD-10-CM | POA: Diagnosis not present

## 2018-03-26 DIAGNOSIS — C9000 Multiple myeloma not having achieved remission: Secondary | ICD-10-CM

## 2018-03-26 DIAGNOSIS — N19 Unspecified kidney failure: Secondary | ICD-10-CM | POA: Diagnosis not present

## 2018-03-26 LAB — CBC WITH DIFFERENTIAL (CANCER CENTER ONLY)
Basophils Absolute: 0 10*3/uL (ref 0.0–0.1)
Basophils Relative: 1 %
EOS ABS: 0.2 10*3/uL (ref 0.0–0.5)
Eosinophils Relative: 19 %
HCT: 34 % — ABNORMAL LOW (ref 38.4–49.9)
Hemoglobin: 11.5 g/dL — ABNORMAL LOW (ref 13.0–17.1)
Lymphocytes Relative: 31 %
Lymphs Abs: 0.4 10*3/uL — ABNORMAL LOW (ref 0.9–3.3)
MCH: 33.3 pg (ref 27.2–33.4)
MCHC: 33.8 g/dL (ref 32.0–36.0)
MCV: 98.6 fL — ABNORMAL HIGH (ref 79.3–98.0)
MONO ABS: 0.3 10*3/uL (ref 0.1–0.9)
MONOS PCT: 24 %
Neutro Abs: 0.3 10*3/uL — CL (ref 1.5–6.5)
Neutrophils Relative %: 25 %
PLATELETS: 119 10*3/uL — AB (ref 140–400)
RBC: 3.45 MIL/uL — ABNORMAL LOW (ref 4.20–5.82)
RDW: 17.3 % — ABNORMAL HIGH (ref 11.0–14.6)
WBC Count: 1.3 10*3/uL — ABNORMAL LOW (ref 4.0–10.3)

## 2018-03-26 NOTE — Telephone Encounter (Signed)
Marsha from lab reported critical ANC of 0.3. Made MD aware. Per Dr. Benay Spice, pt to hold Revlimid and call with any fever, chills, or other symptoms of infection. Pt voiced understanding of plan and states "I have not had any revlimid since Sunday, we are out of pills". This RN vpiced understanding, will make MD aware. Pt to f/u as scheduled.

## 2018-04-01 ENCOUNTER — Other Ambulatory Visit: Payer: Self-pay | Admitting: Oncology

## 2018-04-02 ENCOUNTER — Telehealth: Payer: Self-pay | Admitting: Oncology

## 2018-04-02 ENCOUNTER — Inpatient Hospital Stay: Payer: BLUE CROSS/BLUE SHIELD

## 2018-04-02 ENCOUNTER — Inpatient Hospital Stay (HOSPITAL_BASED_OUTPATIENT_CLINIC_OR_DEPARTMENT_OTHER): Payer: BLUE CROSS/BLUE SHIELD | Admitting: Oncology

## 2018-04-02 VITALS — BP 145/107 | HR 100 | Temp 98.1°F | Resp 18 | Ht 66.0 in | Wt 182.6 lb

## 2018-04-02 DIAGNOSIS — M545 Low back pain: Secondary | ICD-10-CM | POA: Diagnosis not present

## 2018-04-02 DIAGNOSIS — C9000 Multiple myeloma not having achieved remission: Secondary | ICD-10-CM

## 2018-04-02 DIAGNOSIS — D696 Thrombocytopenia, unspecified: Secondary | ICD-10-CM

## 2018-04-02 DIAGNOSIS — R05 Cough: Secondary | ICD-10-CM

## 2018-04-02 DIAGNOSIS — R630 Anorexia: Secondary | ICD-10-CM

## 2018-04-02 DIAGNOSIS — D701 Agranulocytosis secondary to cancer chemotherapy: Secondary | ICD-10-CM

## 2018-04-02 DIAGNOSIS — N19 Unspecified kidney failure: Secondary | ICD-10-CM

## 2018-04-02 DIAGNOSIS — T451X5S Adverse effect of antineoplastic and immunosuppressive drugs, sequela: Secondary | ICD-10-CM

## 2018-04-02 DIAGNOSIS — Z79899 Other long term (current) drug therapy: Secondary | ICD-10-CM

## 2018-04-02 DIAGNOSIS — M549 Dorsalgia, unspecified: Secondary | ICD-10-CM | POA: Diagnosis not present

## 2018-04-02 DIAGNOSIS — D709 Neutropenia, unspecified: Secondary | ICD-10-CM | POA: Diagnosis not present

## 2018-04-02 DIAGNOSIS — D649 Anemia, unspecified: Secondary | ICD-10-CM | POA: Diagnosis not present

## 2018-04-02 LAB — CMP (CANCER CENTER ONLY)
ALBUMIN: 4.4 g/dL (ref 3.5–5.0)
ALK PHOS: 68 U/L (ref 40–150)
ALT: 24 U/L (ref 0–55)
AST: 19 U/L (ref 5–34)
Anion gap: 10 (ref 3–11)
BILIRUBIN TOTAL: 0.5 mg/dL (ref 0.2–1.2)
BUN: 20 mg/dL (ref 7–26)
CALCIUM: 10.2 mg/dL (ref 8.4–10.4)
CO2: 23 mmol/L (ref 22–29)
Chloride: 108 mmol/L (ref 98–109)
Creatinine: 1.35 mg/dL — ABNORMAL HIGH (ref 0.70–1.30)
GFR, EST NON AFRICAN AMERICAN: 57 mL/min — AB (ref >60)
GFR, Est AFR Am: >60 mL/min (ref >60)
GLUCOSE: 114 mg/dL (ref 70–140)
POTASSIUM: 3.9 mmol/L (ref 3.5–5.1)
Sodium: 141 mmol/L (ref 136–145)
TOTAL PROTEIN: 7 g/dL (ref 6.4–8.3)

## 2018-04-02 LAB — CBC WITH DIFFERENTIAL (CANCER CENTER ONLY)
BASOS ABS: 0.1 10*3/uL (ref 0.0–0.1)
Basophils Relative: 4 %
EOS ABS: 0.1 10*3/uL (ref 0.0–0.5)
EOS PCT: 6 %
HCT: 35.5 % — ABNORMAL LOW (ref 38.4–49.9)
Hemoglobin: 12.1 g/dL — ABNORMAL LOW (ref 13.0–17.1)
LYMPHS PCT: 45 %
Lymphs Abs: 0.7 10*3/uL — ABNORMAL LOW (ref 0.9–3.3)
MCH: 33.2 pg (ref 27.2–33.4)
MCHC: 34 g/dL (ref 32.0–36.0)
MCV: 97.8 fL (ref 79.3–98.0)
MONO ABS: 0.4 10*3/uL (ref 0.1–0.9)
Monocytes Relative: 28 %
Neutro Abs: 0.3 10*3/uL — CL (ref 1.5–6.5)
Neutrophils Relative %: 17 %
PLATELETS: 180 10*3/uL (ref 140–400)
RBC: 3.63 MIL/uL — ABNORMAL LOW (ref 4.20–5.82)
RDW: 16 % — ABNORMAL HIGH (ref 11.0–14.6)
WBC Count: 1.6 10*3/uL — ABNORMAL LOW (ref 4.0–10.3)

## 2018-04-02 MED ORDER — DEXAMETHASONE 4 MG PO TABS
40.0000 mg | ORAL_TABLET | ORAL | Status: DC
  Administered 2018-04-02: 40 mg via ORAL

## 2018-04-02 MED ORDER — DEXAMETHASONE 4 MG PO TABS
ORAL_TABLET | ORAL | Status: AC
  Filled 2018-04-02: qty 10

## 2018-04-02 NOTE — Progress Notes (Signed)
[place] OFFICE PROGRESS NOTE   Diagnosis: Myeloma  INTERVAL HISTORY:   Paul Green returns for a scheduled visit.  He resumed Revlimid when he was here on 03/19/2018.  Velcade was placed on hold. When he returned on 03/26/2018 the neutrophil count was down to 0.3.  Revlimid was placed on hold.  No fever.  He reports a mild cough for the past few months.  No dyspnea or chest pain.  He has mild back discomfort.  He is not taking pain medication.  Objective:  Vital signs in last 24 hours:  Blood pressure (!) 148/108, pulse 100, temperature 98.1 F (36.7 C), temperature source Oral, resp. rate 18, height 5' 6" (1.676 m), weight 182 lb 9.6 oz (82.8 kg), SpO2 98 %.    HEENT: No thrush or ulcers Resp: Lungs clear bilaterally Cardio: Regular rate and rhythm, tachycardia GI: No hepatosplenomegaly, nontender Vascular: No leg edema   Lab Results:  Lab Results  Component Value Date   WBC 1.6 (L) 04/02/2018   HGB 12.1 (L) 04/02/2018   HCT 35.5 (L) 04/02/2018   MCV 97.8 04/02/2018   PLT 180 04/02/2018   NEUTROABS 0.3 (LL) 04/02/2018    CMP  Lab Results  Component Value Date   NA 141 04/02/2018   K 3.9 04/02/2018   CL 108 04/02/2018   CO2 23 04/02/2018   GLUCOSE 114 04/02/2018   BUN 20 04/02/2018   CREATININE 1.35 (H) 04/02/2018   CALCIUM 10.2 04/02/2018   PROT 7.0 04/02/2018   ALBUMIN 4.4 04/02/2018   AST 19 04/02/2018   ALT 24 04/02/2018   ALKPHOS 68 04/02/2018   BILITOT 0.5 04/02/2018   GFRNONAA 57 (L) 04/02/2018   GFRAA >60 04/02/2018     Medications: I have reviewed the patient's current medications.   Assessment/Plan: 1. Multiple myeloma   Anemia/thrombocytopenia  Bone marrow biopsy 01/25/2018-hypercellular bone marrow with extensive involvement by plasma cell neoplasm; flow cytometry with a population of cells with plasmacytic differentiation (13%), no monoclonal B-cell population identified, T cells with nonspecific  changes.  Cytogenetics-multiple rearrangements, FISH panel negative for cytogenetic abnormalities to be associated with multiple myeloma including loss of p53  01/24/2018 SPEP-noM spike,elevated serum lambda free light chains (432)  Dexamethasone 40 mg daily times 4 days beginning 01/25/2018  Velcade/dexamethasone weekly x3 followed by a 1 week break beginning3/26/2019(weekly dexamethasone initiated 02/05/2018, Revlimid initiated 02/09/2018)  Lambda light chains improved 02/26/2018  Cycle 2 RVD 03/05/2018 (Velcade dose reduced due to neutropenia), day 8 Velcade held and Revlimid discontinued secondary to recurrent severe neutropenia; day 15 Revlimid resumed x1 week, Velcade held 2. Hypercalcemia status post pamidronate 01/24/2018, resolved 3. Diffuse abnormal bone marrow signal, compression fracture of L1 4. Back painsecondary to multiple myeloma, compression fracture.Improved. 5. Anorexia/weight loss-improved 6. 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 Severe neutropenia -recurrent after Revlimid resumed 03/19/2018      Disposition: Mr. Bogan appears unchanged.  He developed recurrent severe neutropenia after resuming Revlimid.  The neutropenia persists.  The neutropenia is most likely secondary to Revlimid.  Both Revlimid and Velcade will be placed on hold today.  He will receive a dose of Decadron today.  He will return for an office visit with the plan to begin the next cycle of RVD on 04/09/2018.  Mr. Simson will contact us for a fever.  We will follow-up on the light chains from today.  He is waiting to be scheduled for an appointment with Dr. Amalia Hailey.    Betsy Coder, MD  04/02/2018  3:06 PM

## 2018-04-02 NOTE — Telephone Encounter (Signed)
Appointments changed , printed new schedule for patient per 5/28 los

## 2018-04-03 ENCOUNTER — Telehealth: Payer: Self-pay | Admitting: *Deleted

## 2018-04-03 LAB — KAPPA/LAMBDA LIGHT CHAINS
KAPPA, LAMDA LIGHT CHAIN RATIO: 0.59 (ref 0.26–1.65)
Kappa free light chain: 14.1 mg/L (ref 3.3–19.4)
LAMDA FREE LIGHT CHAINS: 23.8 mg/L (ref 5.7–26.3)

## 2018-04-03 NOTE — Telephone Encounter (Signed)
Faxed ROI to [facility], attn: [facility] Patient Referral Coordinator; release [phone]

## 2018-04-04 DIAGNOSIS — M4856XA Collapsed vertebra, not elsewhere classified, lumbar region, initial encounter for fracture: Secondary | ICD-10-CM | POA: Diagnosis not present

## 2018-04-04 DIAGNOSIS — D4989 Neoplasm of unspecified behavior of other specified sites: Secondary | ICD-10-CM | POA: Diagnosis not present

## 2018-04-07 ENCOUNTER — Other Ambulatory Visit: Payer: Self-pay | Admitting: Oncology

## 2018-04-09 ENCOUNTER — Telehealth: Payer: Self-pay | Admitting: Oncology

## 2018-04-09 ENCOUNTER — Encounter: Payer: Self-pay | Admitting: Nurse Practitioner

## 2018-04-09 ENCOUNTER — Inpatient Hospital Stay: Payer: BLUE CROSS/BLUE SHIELD | Attending: Oncology

## 2018-04-09 ENCOUNTER — Inpatient Hospital Stay: Payer: BLUE CROSS/BLUE SHIELD

## 2018-04-09 ENCOUNTER — Inpatient Hospital Stay (HOSPITAL_BASED_OUTPATIENT_CLINIC_OR_DEPARTMENT_OTHER): Payer: BLUE CROSS/BLUE SHIELD | Admitting: Nurse Practitioner

## 2018-04-09 VITALS — BP 138/94 | HR 101 | Temp 97.5°F | Resp 18 | Ht 66.0 in | Wt 181.8 lb

## 2018-04-09 DIAGNOSIS — R002 Palpitations: Secondary | ICD-10-CM | POA: Diagnosis not present

## 2018-04-09 DIAGNOSIS — D649 Anemia, unspecified: Secondary | ICD-10-CM | POA: Insufficient documentation

## 2018-04-09 DIAGNOSIS — D701 Agranulocytosis secondary to cancer chemotherapy: Secondary | ICD-10-CM

## 2018-04-09 DIAGNOSIS — N19 Unspecified kidney failure: Secondary | ICD-10-CM | POA: Diagnosis not present

## 2018-04-09 DIAGNOSIS — C9000 Multiple myeloma not having achieved remission: Secondary | ICD-10-CM

## 2018-04-09 DIAGNOSIS — Z5111 Encounter for antineoplastic chemotherapy: Secondary | ICD-10-CM | POA: Diagnosis not present

## 2018-04-09 DIAGNOSIS — T451X5S Adverse effect of antineoplastic and immunosuppressive drugs, sequela: Secondary | ICD-10-CM | POA: Diagnosis not present

## 2018-04-09 DIAGNOSIS — D696 Thrombocytopenia, unspecified: Secondary | ICD-10-CM

## 2018-04-09 DIAGNOSIS — Z87311 Personal history of (healed) other pathological fracture: Secondary | ICD-10-CM | POA: Insufficient documentation

## 2018-04-09 DIAGNOSIS — M549 Dorsalgia, unspecified: Secondary | ICD-10-CM | POA: Insufficient documentation

## 2018-04-09 DIAGNOSIS — R630 Anorexia: Secondary | ICD-10-CM | POA: Insufficient documentation

## 2018-04-09 DIAGNOSIS — R634 Abnormal weight loss: Secondary | ICD-10-CM | POA: Insufficient documentation

## 2018-04-09 LAB — CBC WITH DIFFERENTIAL (CANCER CENTER ONLY)
BASOS ABS: 0 10*3/uL (ref 0.0–0.1)
BASOS PCT: 1 %
EOS PCT: 7 %
Eosinophils Absolute: 0.1 10*3/uL (ref 0.0–0.5)
HEMATOCRIT: 36 % — AB (ref 38.4–49.9)
Hemoglobin: 12.3 g/dL — ABNORMAL LOW (ref 13.0–17.1)
Lymphocytes Relative: 40 %
Lymphs Abs: 0.7 10*3/uL — ABNORMAL LOW (ref 0.9–3.3)
MCH: 33.4 pg (ref 27.2–33.4)
MCHC: 34.1 g/dL (ref 32.0–36.0)
MCV: 98 fL (ref 79.3–98.0)
MONO ABS: 0.4 10*3/uL (ref 0.1–0.9)
Monocytes Relative: 20 %
NEUTROS ABS: 0.6 10*3/uL — AB (ref 1.5–6.5)
Neutrophils Relative %: 32 %
PLATELETS: 159 10*3/uL (ref 140–400)
RBC: 3.68 MIL/uL — AB (ref 4.20–5.82)
RDW: 16.3 % — ABNORMAL HIGH (ref 11.0–14.6)
WBC: 1.9 10*3/uL — AB (ref 4.0–10.3)

## 2018-04-09 MED ORDER — DEXAMETHASONE 4 MG PO TABS
40.0000 mg | ORAL_TABLET | Freq: Once | ORAL | Status: AC
  Administered 2018-04-09: 40 mg via ORAL

## 2018-04-09 MED ORDER — DEXAMETHASONE 4 MG PO TABS
ORAL_TABLET | ORAL | Status: AC
  Filled 2018-04-09: qty 10

## 2018-04-09 NOTE — Telephone Encounter (Signed)
Appointments scheduled AVS/Calendar printed per 6/4 los °

## 2018-04-09 NOTE — Progress Notes (Addendum)
[place] OFFICE PROGRESS NOTE   Diagnosis: Multiple myeloma  INTERVAL HISTORY:   Paul Green returns as scheduled.  Revlimid and Velcade were placed on hold last week due to persistent neutropenia.  He received the weekly dose of dexamethasone 04/02/2018.  He noted palpitations on 04/03/2018, 04/04/2018 and 04/05/2018.  He noted episodes of "heaviness" in his chest during this time.  There is no radiation of pain.  No associated shortness of breath.  No nausea or vomiting.  No leg swelling or calf pain.  He has had no palpitations since last week.  He reports both parents have hypertension and atrial fibrillation.  His wife has been monitoring his blood pressure at home and reports multiple elevated readings.  Objective:  Vital signs in last 24 hours:  Blood pressure (!) 138/94, pulse (!) 101, temperature (!) 97.5 F (36.4 C), temperature source Oral, resp. rate 18, height _0  (1.676 m), weight 181 lb 12.8 oz (82.5 kg), SpO2 100 %.    HEENT: No thrush or ulcers. Resp: Lungs clear bilaterally. Cardio: Regular rate and rhythm. GI: Abdomen soft and nontender.  No hepatomegaly. Vascular: No leg edema.  Calves soft and nontender.   Lab Results:  Lab Results  Component Value Date   WBC 1.9 (L) 04/09/2018   HGB 12.3 (L) 04/09/2018   HCT 36.0 (L) 04/09/2018   MCV 98.0 04/09/2018   PLT 159 04/09/2018   NEUTROABS 0.6 (L) 04/09/2018    Imaging:  No results found.  Medications: I have reviewed the patient's current medications.  Assessment/Plan: 1. Multiple myeloma   Anemia/thrombocytopenia  Bone marrow biopsy 01/25/2018-hypercellular bone marrow with extensive involvement by plasma cell neoplasm; flow cytometry with a population of cells with plasmacytic differentiation (13%), no monoclonal B-cell population identified, T cells with nonspecific changes.  Cytogenetics-multiple rearrangements, FISH panel negative for cytogenetic abnormalities to be associated  with multiple myeloma including loss of p53  01/24/2018 SPEP-noM spike,elevated serum lambda free light chains (432)  Dexamethasone 40 mg daily times 4 days beginning 01/25/2018  Velcade/dexamethasone weekly x3 followed by a 1 week break beginning3/26/2019(weekly dexamethasone initiated 02/05/2018, Revlimid initiated 02/09/2018)  Lambda light chains improved 02/26/2018  Cycle 2 RVD 03/05/2018 (Velcade dose reduced due to neutropenia), day 8 Velcade held and Revlimid discontinued secondary to recurrent severe neutropenia;day 15 Revlimid resumed x1 week, Velcade held;   Revlimid and Velcade placed on hold 04/02/2018 due to persistent neutropenia. 2. Hypercalcemia status post pamidronate 01/24/2018, resolved 3. Diffuse abnormal bone marrow signal, compression fracture of L1 4. Back painsecondary to multiple myeloma, compression fracture.Improved. 5. Anorexia/weight loss-improved 6. 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 Severe neutropenia -recurrent after Revlimid resumed 03/19/2018   Disposition: Paul Green appears stable.  Revlimid and Velcade were placed on hold last week due to neutropenia.  He has persistent neutropenia on labs today.  We will continue to hold Revlimid and Velcade.  He will return for a follow-up visit on 04/17/2018 with plans to resume treatment, Revlimid dose reduced to 5 mg, if the neutrophil count has recovered.  He has an appointment with Dr. Amalia Hailey on 04/16/2018.  He had palpitations last week.  Blood pressure has been increased.  We made a referral to cardiology.  Patient seen with Dr. Benay Spice.  Ned Card ANP/GNP-BC   04/09/2018  2:47 PM This was a shared visit with Ned Card.  Paul Green has persistent neutropenia.  Revlimid and Velcade remain on hold.  He continues weekly Decadron.  I reviewed the EKG today.  We will make a cardiology referral to evaluate the "palpitations "and for recommendations regarding management of  hypertension.  We will see him after the appointment with Dr. Amalia Hailey next week.  Julieanne Manson, MD

## 2018-04-16 ENCOUNTER — Telehealth: Payer: Self-pay | Admitting: Oncology

## 2018-04-16 ENCOUNTER — Ambulatory Visit: Payer: BLUE CROSS/BLUE SHIELD

## 2018-04-16 ENCOUNTER — Other Ambulatory Visit: Payer: BLUE CROSS/BLUE SHIELD

## 2018-04-16 DIAGNOSIS — N179 Acute kidney failure, unspecified: Secondary | ICD-10-CM | POA: Diagnosis not present

## 2018-04-16 DIAGNOSIS — C9000 Multiple myeloma not having achieved remission: Secondary | ICD-10-CM | POA: Diagnosis not present

## 2018-04-16 DIAGNOSIS — C9001 Multiple myeloma in remission: Secondary | ICD-10-CM | POA: Diagnosis not present

## 2018-04-16 DIAGNOSIS — M4850XA Collapsed vertebra, not elsewhere classified, site unspecified, initial encounter for fracture: Secondary | ICD-10-CM | POA: Diagnosis not present

## 2018-04-16 DIAGNOSIS — D801 Nonfamilial hypogammaglobulinemia: Secondary | ICD-10-CM | POA: Diagnosis not present

## 2018-04-16 DIAGNOSIS — M4856XA Collapsed vertebra, not elsewhere classified, lumbar region, initial encounter for fracture: Secondary | ICD-10-CM | POA: Diagnosis not present

## 2018-04-16 DIAGNOSIS — D61818 Other pancytopenia: Secondary | ICD-10-CM | POA: Diagnosis not present

## 2018-04-16 NOTE — Telephone Encounter (Signed)
Called pt re moving appts per 6/11 sch msg - spoke w/ pt re appts

## 2018-04-17 ENCOUNTER — Inpatient Hospital Stay: Payer: BLUE CROSS/BLUE SHIELD | Admitting: Oncology

## 2018-04-17 ENCOUNTER — Inpatient Hospital Stay: Payer: BLUE CROSS/BLUE SHIELD

## 2018-04-18 DIAGNOSIS — C9000 Multiple myeloma not having achieved remission: Secondary | ICD-10-CM | POA: Diagnosis not present

## 2018-04-18 DIAGNOSIS — C9200 Acute myeloblastic leukemia, not having achieved remission: Secondary | ICD-10-CM | POA: Diagnosis not present

## 2018-04-22 ENCOUNTER — Telehealth: Payer: Self-pay

## 2018-04-22 NOTE — Telephone Encounter (Signed)
Informed pt that no lab appt needed tomorrow

## 2018-04-23 ENCOUNTER — Inpatient Hospital Stay: Payer: BLUE CROSS/BLUE SHIELD

## 2018-04-23 ENCOUNTER — Telehealth: Payer: Self-pay | Admitting: Oncology

## 2018-04-23 ENCOUNTER — Inpatient Hospital Stay (HOSPITAL_BASED_OUTPATIENT_CLINIC_OR_DEPARTMENT_OTHER): Payer: BLUE CROSS/BLUE SHIELD | Admitting: Oncology

## 2018-04-23 VITALS — BP 157/89 | HR 66 | Temp 97.5°F | Resp 18 | Ht 66.0 in | Wt 185.5 lb

## 2018-04-23 DIAGNOSIS — C9000 Multiple myeloma not having achieved remission: Secondary | ICD-10-CM

## 2018-04-23 DIAGNOSIS — D649 Anemia, unspecified: Secondary | ICD-10-CM | POA: Diagnosis not present

## 2018-04-23 DIAGNOSIS — R002 Palpitations: Secondary | ICD-10-CM | POA: Diagnosis not present

## 2018-04-23 DIAGNOSIS — Z87311 Personal history of (healed) other pathological fracture: Secondary | ICD-10-CM

## 2018-04-23 DIAGNOSIS — R630 Anorexia: Secondary | ICD-10-CM

## 2018-04-23 DIAGNOSIS — D696 Thrombocytopenia, unspecified: Secondary | ICD-10-CM | POA: Diagnosis not present

## 2018-04-23 DIAGNOSIS — R634 Abnormal weight loss: Secondary | ICD-10-CM | POA: Diagnosis not present

## 2018-04-23 DIAGNOSIS — Z5111 Encounter for antineoplastic chemotherapy: Secondary | ICD-10-CM | POA: Diagnosis not present

## 2018-04-23 DIAGNOSIS — M549 Dorsalgia, unspecified: Secondary | ICD-10-CM

## 2018-04-23 DIAGNOSIS — D701 Agranulocytosis secondary to cancer chemotherapy: Secondary | ICD-10-CM

## 2018-04-23 DIAGNOSIS — N19 Unspecified kidney failure: Secondary | ICD-10-CM

## 2018-04-23 DIAGNOSIS — T451X5S Adverse effect of antineoplastic and immunosuppressive drugs, sequela: Secondary | ICD-10-CM | POA: Diagnosis not present

## 2018-04-23 LAB — CMP (CANCER CENTER ONLY)
ALT: 23 U/L (ref 0–55)
AST: 20 U/L (ref 5–34)
Albumin: 4.8 g/dL (ref 3.5–5.0)
Alkaline Phosphatase: 62 U/L (ref 40–150)
Anion gap: 11 (ref 3–11)
BUN: 18 mg/dL (ref 7–26)
CALCIUM: 10.4 mg/dL (ref 8.4–10.4)
CHLORIDE: 109 mmol/L (ref 98–109)
CO2: 23 mmol/L (ref 22–29)
CREATININE: 1.25 mg/dL (ref 0.70–1.30)
Glucose, Bld: 93 mg/dL (ref 70–140)
Potassium: 3.9 mmol/L (ref 3.5–5.1)
Sodium: 143 mmol/L (ref 136–145)
Total Bilirubin: 0.6 mg/dL (ref 0.2–1.2)
Total Protein: 7.2 g/dL (ref 6.4–8.3)

## 2018-04-23 LAB — CBC WITH DIFFERENTIAL (CANCER CENTER ONLY)
Basophils Absolute: 0 10*3/uL (ref 0.0–0.1)
Basophils Relative: 1 %
Eosinophils Absolute: 0.2 10*3/uL (ref 0.0–0.5)
Eosinophils Relative: 4 %
HCT: 38.8 % (ref 38.4–49.9)
Hemoglobin: 13.4 g/dL (ref 13.0–17.1)
LYMPHS ABS: 1 10*3/uL (ref 0.9–3.3)
LYMPHS PCT: 27 %
MCH: 32.7 pg (ref 27.2–33.4)
MCHC: 34.5 g/dL (ref 32.0–36.0)
MCV: 94.6 fL (ref 79.3–98.0)
MONO ABS: 0.4 10*3/uL (ref 0.1–0.9)
MONOS PCT: 10 %
Neutro Abs: 2.1 10*3/uL (ref 1.5–6.5)
Neutrophils Relative %: 58 %
Platelet Count: 122 10*3/uL — ABNORMAL LOW (ref 140–400)
RBC: 4.1 MIL/uL — AB (ref 4.20–5.82)
RDW: 14.1 % (ref 11.0–14.6)
WBC: 3.6 10*3/uL — AB (ref 4.0–10.3)

## 2018-04-23 MED ORDER — DEXAMETHASONE 4 MG PO TABS
ORAL_TABLET | ORAL | Status: AC
  Filled 2018-04-23: qty 10

## 2018-04-23 MED ORDER — PROCHLORPERAZINE MALEATE 10 MG PO TABS
10.0000 mg | ORAL_TABLET | Freq: Once | ORAL | Status: AC
  Administered 2018-04-23: 10 mg via ORAL

## 2018-04-23 MED ORDER — PROCHLORPERAZINE MALEATE 10 MG PO TABS
ORAL_TABLET | ORAL | Status: AC
Start: 2018-04-23 — End: ?
  Filled 2018-04-23: qty 1

## 2018-04-23 MED ORDER — BORTEZOMIB CHEMO SQ INJECTION 3.5 MG (2.5MG/ML)
0.9000 mg/m2 | Freq: Once | INTRAMUSCULAR | Status: AC
  Administered 2018-04-23: 1.75 mg via SUBCUTANEOUS
  Filled 2018-04-23: qty 0.7

## 2018-04-23 MED ORDER — DEXAMETHASONE 4 MG PO TABS
40.0000 mg | ORAL_TABLET | ORAL | Status: DC
  Administered 2018-04-23: 40 mg via ORAL

## 2018-04-23 MED ORDER — ZOLEDRONIC ACID 4 MG/100ML IV SOLN
4.0000 mg | Freq: Once | INTRAVENOUS | Status: AC
  Administered 2018-04-23: 4 mg via INTRAVENOUS
  Filled 2018-04-23: qty 100

## 2018-04-23 MED ORDER — DEXAMETHASONE SODIUM PHOSPHATE 10 MG/ML IJ SOLN
INTRAMUSCULAR | Status: AC
  Filled 2018-04-23: qty 1

## 2018-04-23 NOTE — Progress Notes (Signed)
Ok to use CMP from 04/16/18 from [facility] per Dr. Benay Spice

## 2018-04-23 NOTE — Progress Notes (Signed)
[place] OFFICE PROGRESS NOTE   Diagnosis: Multiple myeloma  INTERVAL HISTORY:   [name] returns as scheduled.  He underwent a restaging bone marrow biopsy at [facility] [place] last week.  This showed no evidence of residual myeloma.  He has been maintained off of Revlimid and Velcade for greater than 1 month. He feels well.  No significant back pain.  Objective:  Vital signs in last 24 hours:  Blood pressure (!) 157/89, pulse 66, temperature (!) 97.5 F (36.4 C), temperature source Oral, resp. rate 18, height '5\' 6"'  (1.676 m), weight 185 lb 8 oz (84.1 kg), SpO2 100 %.    HEENT: No thrush Resp: Lungs clear bilaterally Cardio: Regular rate and rhythm GI: No hepatospleno megaly Vascular: No leg edema     Portacath/PICC-without erythema  Lab Results:  Lab Results  Component Value Date   WBC 3.6 (L) 04/23/2018   HGB 13.4 04/23/2018   HCT 38.8 04/23/2018   MCV 94.6 04/23/2018   PLT 122 (L) 04/23/2018   NEUTROABS 2.1 04/23/2018    CMP  Lab Results  Component Value Date   NA 143 04/23/2018   K 3.9 04/23/2018   CL 109 04/23/2018   CO2 23 04/23/2018   GLUCOSE 93 04/23/2018   BUN 18 04/23/2018   CREATININE 1.25 04/23/2018   CALCIUM 10.4 04/23/2018   PROT 7.2 04/23/2018   ALBUMIN 4.8 04/23/2018   AST 20 04/23/2018   ALT 23 04/23/2018   ALKPHOS 62 04/23/2018   BILITOT 0.6 04/23/2018   GFRNONAA >60 04/23/2018   GFRAA >60 04/23/2018    No results found for: CEA1  Lab Results  Component Value Date   INR 1.13 01/25/2018    Imaging:  No results found.  Medications: I have reviewed the patient's current medications.   Assessment/Plan: 1. Multiple myeloma   Anemia/thrombocytopenia  Bone marrow biopsy 01/25/2018-hypercellular bone marrow with extensive involvement by plasma cell neoplasm; flow cytometry with a population of cells with plasmacytic differentiation (13%), no monoclonal B-cell population identified, T cells with nonspecific  changes.  Cytogenetics-multiple rearrangements, FISH panel negative for cytogenetic abnormalities to be associated with multiple myeloma including loss of p53  01/24/2018 SPEP-noM spike,elevated serum lambda free light chains (432)  Dexamethasone 40 mg daily times 4 days beginning 01/25/2018  Velcade/dexamethasone weekly x3 followed by a 1 week break beginning3/26/2019(weekly dexamethasone initiated 02/05/2018, Revlimid initiated 02/09/2018)  Lambda light chains improved 02/26/2018  Cycle 2 RVD 03/05/2018 (Velcade dose reduced due to neutropenia), day 8 Velcade held and Revlimid discontinued secondary to recurrent severe neutropenia;day 15 Revlimid resumed x1 week, Velcade held;   Revlimid and Velcade placed on hold 04/02/2018 due to persistent neutropenia.  Bone marrow biopsy at [facility] 04/18/2018-hypercellular marrow, no evidence of myeloma  Cycle 3 RVD started 04/23/2018 2. Hypercalcemia status post pamidronate 01/24/2018, resolved 3. Diffuse abnormal bone marrow signal, compression fracture of L1 4. Back painsecondary to multiple myeloma, compression fracture.Improved. 5. Anorexia/weight loss-improved 6. 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 Severe neutropenia-recurrent after Revlimid resumed 03/19/2018, resolved   Disposition: Paul Green appears well.  The serum free light chains have returned to the normal range.  The creatinine is now normal.  The severe neutropenia has resolved.  A bone marrow biopsy at [facility] 04/18/2018 confirmed a complete histologic remission.  Molecular studies are pending.  I reviewed the note from Dr. Amalia Hailey.  I plan to contact Dr. Amalia Hailey to discuss the case.  I am reluctant to dose escalate the Revlimid given the history of  severe neutropenia and the excellent response to abbreviated RVD.  He would like to resume treatment today.  He will begin with Velcade/Decadron today.  Mr. Bazinet will return for Velcade in 1 week in 2  weeks.  He will return for an office visit in 2 weeks.  The plan is to resume Revlimid pending the discussion with Dr. Amalia Hailey.  25 minutes were spent with the patient today.  The majority of the time was used for counseling and coordination of care.  Betsy Coder, MD  04/23/2018  6:11 PM

## 2018-04-23 NOTE — Patient Instructions (Signed)
[facility] Discharge Instructions for Patients Receiving Chemotherapy  Today you received the following chemotherapy agents: Velcade and Zometa  To help prevent nausea and vomiting after your treatment, we encourage you to take your nausea medication as directed.   If you develop nausea and vomiting that is not controlled by your nausea medication, call the clinic.   BELOW ARE SYMPTOMS THAT SHOULD BE REPORTED IMMEDIATELY:  *FEVER GREATER THAN 100.5 F  *CHILLS WITH OR WITHOUT FEVER  NAUSEA AND VOMITING THAT IS NOT CONTROLLED WITH YOUR NAUSEA MEDICATION  *UNUSUAL SHORTNESS OF BREATH  *UNUSUAL BRUISING OR BLEEDING  TENDERNESS IN MOUTH AND THROAT WITH OR WITHOUT PRESENCE OF ULCERS  *URINARY PROBLEMS  *BOWEL PROBLEMS  UNUSUAL RASH Items with * indicate a potential emergency and should be followed up as soon as possible.  Feel free to call the clinic should you have any questions or concerns. The clinic phone number is [phone].  Please show the CHEMO ALERT CARD at check-in to the Emergency Department and triage nurse.   

## 2018-04-23 NOTE — Telephone Encounter (Signed)
Scheduled appt per 6/18 los - gave pt AVS and calender per los.

## 2018-04-25 ENCOUNTER — Other Ambulatory Visit: Payer: Self-pay

## 2018-04-25 DIAGNOSIS — C9000 Multiple myeloma not having achieved remission: Secondary | ICD-10-CM

## 2018-04-25 MED ORDER — LENALIDOMIDE 5 MG PO CAPS: 5.0000 mg | ORAL_CAPSULE | Freq: Every day | ORAL | 0 refills | Status: DC

## 2018-04-30 ENCOUNTER — Inpatient Hospital Stay: Payer: BLUE CROSS/BLUE SHIELD

## 2018-04-30 ENCOUNTER — Ambulatory Visit: Payer: BLUE CROSS/BLUE SHIELD

## 2018-04-30 VITALS — BP 150/91 | HR 72 | Temp 98.1°F | Resp 16

## 2018-04-30 DIAGNOSIS — T451X5S Adverse effect of antineoplastic and immunosuppressive drugs, sequela: Secondary | ICD-10-CM | POA: Diagnosis not present

## 2018-04-30 DIAGNOSIS — D649 Anemia, unspecified: Secondary | ICD-10-CM | POA: Diagnosis not present

## 2018-04-30 DIAGNOSIS — R634 Abnormal weight loss: Secondary | ICD-10-CM | POA: Diagnosis not present

## 2018-04-30 DIAGNOSIS — R630 Anorexia: Secondary | ICD-10-CM | POA: Diagnosis not present

## 2018-04-30 DIAGNOSIS — Z5111 Encounter for antineoplastic chemotherapy: Secondary | ICD-10-CM | POA: Diagnosis not present

## 2018-04-30 DIAGNOSIS — C9000 Multiple myeloma not having achieved remission: Secondary | ICD-10-CM

## 2018-04-30 DIAGNOSIS — D701 Agranulocytosis secondary to cancer chemotherapy: Secondary | ICD-10-CM | POA: Diagnosis not present

## 2018-04-30 DIAGNOSIS — M549 Dorsalgia, unspecified: Secondary | ICD-10-CM | POA: Diagnosis not present

## 2018-04-30 DIAGNOSIS — R002 Palpitations: Secondary | ICD-10-CM | POA: Diagnosis not present

## 2018-04-30 DIAGNOSIS — N19 Unspecified kidney failure: Secondary | ICD-10-CM | POA: Diagnosis not present

## 2018-04-30 DIAGNOSIS — Z87311 Personal history of (healed) other pathological fracture: Secondary | ICD-10-CM | POA: Diagnosis not present

## 2018-04-30 DIAGNOSIS — D696 Thrombocytopenia, unspecified: Secondary | ICD-10-CM | POA: Diagnosis not present

## 2018-04-30 LAB — CBC WITH DIFFERENTIAL (CANCER CENTER ONLY)
Basophils Absolute: 0 10*3/uL (ref 0.0–0.1)
Basophils Relative: 0 %
Eosinophils Absolute: 0.2 10*3/uL (ref 0.0–0.5)
Eosinophils Relative: 6 %
HEMATOCRIT: 38.1 % — AB (ref 38.4–49.9)
Hemoglobin: 13.1 g/dL (ref 13.0–17.1)
LYMPHS ABS: 0.7 10*3/uL — AB (ref 0.9–3.3)
Lymphocytes Relative: 25 %
MCH: 32.8 pg (ref 27.2–33.4)
MCHC: 34.4 g/dL (ref 32.0–36.0)
MCV: 95.4 fL (ref 79.3–98.0)
MONO ABS: 0.4 10*3/uL (ref 0.1–0.9)
MONOS PCT: 16 %
NEUTROS ABS: 1.4 10*3/uL — AB (ref 1.5–6.5)
Neutrophils Relative %: 53 %
PLATELETS: 142 10*3/uL (ref 140–400)
RBC: 3.99 MIL/uL — ABNORMAL LOW (ref 4.20–5.82)
RDW: 15 % — AB (ref 11.0–14.6)
WBC Count: 2.7 10*3/uL — ABNORMAL LOW (ref 4.0–10.3)

## 2018-04-30 MED ORDER — DEXAMETHASONE 4 MG PO TABS
40.0000 mg | ORAL_TABLET | ORAL | Status: DC
  Administered 2018-04-30: 40 mg via ORAL

## 2018-04-30 MED ORDER — BORTEZOMIB CHEMO SQ INJECTION 3.5 MG (2.5MG/ML)
0.9000 mg/m2 | Freq: Once | INTRAMUSCULAR | Status: AC
  Administered 2018-04-30: 1.75 mg via SUBCUTANEOUS
  Filled 2018-04-30: qty 0.7

## 2018-04-30 MED ORDER — PROCHLORPERAZINE MALEATE 10 MG PO TABS
ORAL_TABLET | ORAL | Status: AC
  Filled 2018-04-30: qty 1

## 2018-04-30 MED ORDER — PROCHLORPERAZINE MALEATE 10 MG PO TABS
10.0000 mg | ORAL_TABLET | Freq: Once | ORAL | Status: AC
  Administered 2018-04-30: 10 mg via ORAL

## 2018-04-30 MED ORDER — DEXAMETHASONE 4 MG PO TABS
ORAL_TABLET | ORAL | Status: AC
Start: 2018-04-30 — End: ?
  Filled 2018-04-30: qty 10

## 2018-04-30 NOTE — Progress Notes (Signed)
Per patient request, Velcade given prior to 30 minute post pre medications.  Patient tolerated well, discharged with no distress.

## 2018-04-30 NOTE — Progress Notes (Signed)
Okay to treat today with ANC of 1.4, and no CMP needed today per Dr. Benay Spice.

## 2018-04-30 NOTE — Patient Instructions (Signed)
[facility] Discharge Instructions for Patients Receiving Chemotherapy  Today you received the following chemotherapy agents Velcade.  To help prevent nausea and vomiting after your treatment, we encourage you to take your nausea medication as directed.  If you develop nausea and vomiting that is not controlled by your nausea medication, call the clinic.   BELOW ARE SYMPTOMS THAT SHOULD BE REPORTED IMMEDIATELY:  *FEVER GREATER THAN 100.5 F  *CHILLS WITH OR WITHOUT FEVER  NAUSEA AND VOMITING THAT IS NOT CONTROLLED WITH YOUR NAUSEA MEDICATION  *UNUSUAL SHORTNESS OF BREATH  *UNUSUAL BRUISING OR BLEEDING  TENDERNESS IN MOUTH AND THROAT WITH OR WITHOUT PRESENCE OF ULCERS  *URINARY PROBLEMS  *BOWEL PROBLEMS  UNUSUAL RASH Items with * indicate a potential emergency and should be followed up as soon as possible.  Feel free to call the clinic should you have any questions or concerns. The clinic phone number is [phone].  Please show the CHEMO ALERT CARD at check-in to the Emergency Department and triage nurse.   

## 2018-05-05 ENCOUNTER — Other Ambulatory Visit: Payer: Self-pay | Admitting: Oncology

## 2018-05-07 ENCOUNTER — Inpatient Hospital Stay: Payer: BLUE CROSS/BLUE SHIELD

## 2018-05-07 ENCOUNTER — Telehealth: Payer: Self-pay | Admitting: Oncology

## 2018-05-07 ENCOUNTER — Inpatient Hospital Stay: Payer: BLUE CROSS/BLUE SHIELD | Attending: Oncology

## 2018-05-07 ENCOUNTER — Ambulatory Visit: Payer: BLUE CROSS/BLUE SHIELD

## 2018-05-07 ENCOUNTER — Inpatient Hospital Stay (HOSPITAL_BASED_OUTPATIENT_CLINIC_OR_DEPARTMENT_OTHER): Payer: BLUE CROSS/BLUE SHIELD | Admitting: Nurse Practitioner

## 2018-05-07 ENCOUNTER — Encounter: Payer: Self-pay | Admitting: Nurse Practitioner

## 2018-05-07 VITALS — BP 146/90 | HR 104 | Temp 97.7°F | Resp 18 | Ht 66.0 in | Wt 185.5 lb

## 2018-05-07 DIAGNOSIS — Z79899 Other long term (current) drug therapy: Secondary | ICD-10-CM

## 2018-05-07 DIAGNOSIS — D696 Thrombocytopenia, unspecified: Secondary | ICD-10-CM | POA: Insufficient documentation

## 2018-05-07 DIAGNOSIS — Z5111 Encounter for antineoplastic chemotherapy: Secondary | ICD-10-CM | POA: Insufficient documentation

## 2018-05-07 DIAGNOSIS — M545 Low back pain: Secondary | ICD-10-CM | POA: Insufficient documentation

## 2018-05-07 DIAGNOSIS — C9000 Multiple myeloma not having achieved remission: Secondary | ICD-10-CM

## 2018-05-07 DIAGNOSIS — D649 Anemia, unspecified: Secondary | ICD-10-CM

## 2018-05-07 DIAGNOSIS — R630 Anorexia: Secondary | ICD-10-CM | POA: Insufficient documentation

## 2018-05-07 LAB — CMP (CANCER CENTER ONLY)
ALBUMIN: 4.7 g/dL (ref 3.5–5.0)
ALK PHOS: 61 U/L (ref 38–126)
ALT: 25 U/L (ref 0–44)
ANION GAP: 9 (ref 5–15)
AST: 19 U/L (ref 15–41)
BUN: 16 mg/dL (ref 6–20)
CHLORIDE: 105 mmol/L (ref 98–111)
CO2: 27 mmol/L (ref 22–32)
Calcium: 10.4 mg/dL — ABNORMAL HIGH (ref 8.9–10.3)
Creatinine: 1.28 mg/dL — ABNORMAL HIGH (ref 0.61–1.24)
GFR, Estimated: >60 mL/min (ref >60)
Glucose, Bld: 101 mg/dL — ABNORMAL HIGH (ref 70–99)
Potassium: 3.7 mmol/L (ref 3.5–5.1)
SODIUM: 141 mmol/L (ref 135–145)
Total Bilirubin: 0.7 mg/dL (ref 0.3–1.2)
Total Protein: 7 g/dL (ref 6.5–8.1)

## 2018-05-07 LAB — CBC WITH DIFFERENTIAL (CANCER CENTER ONLY)
BASOS PCT: 0 %
Basophils Absolute: 0 10*3/uL (ref 0.0–0.1)
EOS ABS: 0.1 10*3/uL (ref 0.0–0.5)
Eosinophils Relative: 3 %
HEMATOCRIT: 39.9 % (ref 38.4–49.9)
HEMOGLOBIN: 14 g/dL (ref 13.0–17.1)
LYMPHS ABS: 0.9 10*3/uL (ref 0.9–3.3)
Lymphocytes Relative: 19 %
MCH: 33.4 pg (ref 27.2–33.4)
MCHC: 35.1 g/dL (ref 32.0–36.0)
MCV: 95.2 fL (ref 79.3–98.0)
MONO ABS: 0.4 10*3/uL (ref 0.1–0.9)
MONOS PCT: 8 %
NEUTROS PCT: 70 %
Neutro Abs: 3.4 10*3/uL (ref 1.5–6.5)
Platelet Count: 140 10*3/uL (ref 140–400)
RBC: 4.19 MIL/uL — ABNORMAL LOW (ref 4.20–5.82)
RDW: 14.1 % (ref 11.0–14.6)
WBC Count: 4.8 10*3/uL (ref 4.0–10.3)

## 2018-05-07 MED ORDER — BORTEZOMIB CHEMO SQ INJECTION 3.5 MG (2.5MG/ML)
0.9000 mg/m2 | Freq: Once | INTRAMUSCULAR | Status: AC
  Administered 2018-05-07: 1.75 mg via SUBCUTANEOUS
  Filled 2018-05-07: qty 0.7

## 2018-05-07 MED ORDER — PROCHLORPERAZINE MALEATE 10 MG PO TABS
10.0000 mg | ORAL_TABLET | Freq: Once | ORAL | Status: AC
  Administered 2018-05-07: 10 mg via ORAL

## 2018-05-07 MED ORDER — PROCHLORPERAZINE MALEATE 10 MG PO TABS
ORAL_TABLET | ORAL | Status: AC
  Filled 2018-05-07: qty 1

## 2018-05-07 MED ORDER — DEXAMETHASONE 4 MG PO TABS
ORAL_TABLET | ORAL | Status: AC
  Filled 2018-05-07: qty 10

## 2018-05-07 MED ORDER — DEXAMETHASONE 4 MG PO TABS
40.0000 mg | ORAL_TABLET | ORAL | Status: DC
  Administered 2018-05-07: 40 mg via ORAL

## 2018-05-07 NOTE — Progress Notes (Addendum)
  [place] OFFICE PROGRESS NOTE   Diagnosis: Multiple myeloma  INTERVAL HISTORY:   [name] returns as scheduled.  He completed cycle 3 week 2 Velcade 04/30/2018.  He continues Revlimid.  He denies nausea/vomiting.  No mouth sores.  No diarrhea.  No rash.  He has a good appetite.  Objective:  Vital signs in last 24 hours:  Blood pressure (!) 146/90, pulse (!) 104, temperature 97.7 F (36.5 C), temperature source Oral, resp. rate 18, height '5\' 6"'$  (1.676 m), weight 185 lb 8 oz (84.1 kg), SpO2 99 %.    HEENT: No thrush or ulcers. Resp: Lungs clear bilaterally. Cardio: Regular rate and rhythm. GI: Abdomen soft and nontender.  No hepatomegaly. Vascular: No leg edema.  Calves soft and nontender.   Lab Results:  Lab Results  Component Value Date   WBC 4.8 05/07/2018   HGB 14.0 05/07/2018   HCT 39.9 05/07/2018   MCV 95.2 05/07/2018   PLT 140 05/07/2018   NEUTROABS 3.4 05/07/2018    Imaging:  No results found.  Medications: I have reviewed the patient's current medications.  Assessment/Plan: 1. Multiple myeloma   Anemia/thrombocytopenia  Bone marrow biopsy 01/25/2018-hypercellular bone marrow with extensive involvement by plasma cell neoplasm; flow cytometry with a population of cells with plasmacytic differentiation (13%), no monoclonal B-cell population identified, T cells with nonspecific changes.  Cytogenetics-multiple rearrangements, FISH panel negative for cytogenetic abnormalities to be associated with multiple myeloma including loss of p53  01/24/2018 SPEP-noM spike,elevated serum lambda free light chains (432)  Dexamethasone 40 mg daily times 4 days beginning 01/25/2018  Velcade/dexamethasone weekly x3 followed by a 1 week break beginning3/26/2019(weekly dexamethasone initiated 02/05/2018, Revlimid initiated 02/09/2018)  Lambda light chains improved 02/26/2018  Cycle 2 RVD 03/05/2018 (Velcade dose reduced due to neutropenia), day 8 Velcade  held and Revlimid discontinued secondary to recurrent severe neutropenia;day 15 Revlimid resumed x1 week, Velcade held;  Revlimid and Velcade placed on hold 04/02/2018 due to persistent neutropenia.  Bone marrow biopsy at [facility] 04/18/2018-hypercellular marrow, no evidence of myeloma  Cycle 3 RVD started 04/23/2018, Revlimid 5 mg daily for 14 days beginning 04/30/2018 2. Hypercalcemia status post pamidronate 01/24/2018, resolved 3. Diffuse abnormal bone marrow signal, compression fracture of L1 4. Back painsecondary to multiple myeloma, compression fracture.Improved. 5. Anorexia/weight loss-improved 6. 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 Severe neutropenia-recurrent after Revlimid resumed 03/19/2018, resolved   Disposition: Paul Green appears stable.  Plan to proceed with cycle 3-week 3 Velcade today as scheduled.  He will continue Revlimid to complete 14 days.  We reviewed the CBC from today.  The neutrophil count is in normal range.  He will return for lab, follow-up, Velcade and Zometa on 05/21/2018.  He will contact the office in the interim with any problems.  Patient seen with Dr. Benay Spice.    Ned Card ANP/GNP-BC   05/07/2018  2:30 PM This was a shared visit with Ned Card.  Paul Green is tolerating the current cycle of RVD well.  The neutrophil count is adequate.  We will follow-up on the light chains from today.  He will return for a lab visit and Zometa 05/22/2018.  We are waiting to hear back from Dr. Amalia Hailey regarding his recommendation for stem cell therapy.  Julieanne Manson, MD

## 2018-05-07 NOTE — Telephone Encounter (Signed)
Appointments scheduled AVS/Calendar printed per 7/1 los. Message to Dr Benay Spice regarding f/u visit for 7/17/ per 7/2 los

## 2018-05-08 LAB — KAPPA/LAMBDA LIGHT CHAINS
KAPPA FREE LGHT CHN: 8 mg/L (ref 3.3–19.4)
Kappa, lambda light chain ratio: 0.26 (ref 0.26–1.65)
Lambda free light chains: 30.9 mg/L — ABNORMAL HIGH (ref 5.7–26.3)

## 2018-05-13 ENCOUNTER — Ambulatory Visit: Payer: BLUE CROSS/BLUE SHIELD | Admitting: Cardiology

## 2018-05-14 ENCOUNTER — Other Ambulatory Visit: Payer: Self-pay | Admitting: Oncology

## 2018-05-14 DIAGNOSIS — C9000 Multiple myeloma not having achieved remission: Secondary | ICD-10-CM

## 2018-05-19 ENCOUNTER — Other Ambulatory Visit: Payer: Self-pay | Admitting: Oncology

## 2018-05-22 ENCOUNTER — Inpatient Hospital Stay: Payer: BLUE CROSS/BLUE SHIELD

## 2018-05-22 ENCOUNTER — Ambulatory Visit: Payer: BLUE CROSS/BLUE SHIELD

## 2018-05-22 ENCOUNTER — Other Ambulatory Visit: Payer: Self-pay

## 2018-05-22 ENCOUNTER — Inpatient Hospital Stay (HOSPITAL_BASED_OUTPATIENT_CLINIC_OR_DEPARTMENT_OTHER): Payer: BLUE CROSS/BLUE SHIELD | Admitting: Oncology

## 2018-05-22 ENCOUNTER — Other Ambulatory Visit: Payer: BLUE CROSS/BLUE SHIELD

## 2018-05-22 VITALS — BP 133/100 | HR 90 | Temp 98.2°F | Resp 20 | Ht 66.0 in | Wt 187.1 lb

## 2018-05-22 DIAGNOSIS — C9000 Multiple myeloma not having achieved remission: Secondary | ICD-10-CM

## 2018-05-22 DIAGNOSIS — M545 Low back pain: Secondary | ICD-10-CM | POA: Diagnosis not present

## 2018-05-22 DIAGNOSIS — D649 Anemia, unspecified: Secondary | ICD-10-CM

## 2018-05-22 DIAGNOSIS — Z5111 Encounter for antineoplastic chemotherapy: Secondary | ICD-10-CM | POA: Diagnosis not present

## 2018-05-22 DIAGNOSIS — Z79899 Other long term (current) drug therapy: Secondary | ICD-10-CM | POA: Diagnosis not present

## 2018-05-22 DIAGNOSIS — D696 Thrombocytopenia, unspecified: Secondary | ICD-10-CM

## 2018-05-22 LAB — CMP (CANCER CENTER ONLY)
ALBUMIN: 4.3 g/dL (ref 3.5–5.0)
ALT: 26 U/L (ref 0–44)
ANION GAP: 7 (ref 5–15)
AST: 19 U/L (ref 15–41)
Alkaline Phosphatase: 51 U/L (ref 38–126)
BUN: 17 mg/dL (ref 6–20)
CHLORIDE: 108 mmol/L (ref 98–111)
CO2: 25 mmol/L (ref 22–32)
Calcium: 10 mg/dL (ref 8.9–10.3)
Creatinine: 1.22 mg/dL (ref 0.61–1.24)
GFR, Estimated: >60 mL/min (ref >60)
GLUCOSE: 106 mg/dL — AB (ref 70–99)
Potassium: 3.4 mmol/L — ABNORMAL LOW (ref 3.5–5.1)
Sodium: 140 mmol/L (ref 135–145)
Total Bilirubin: 0.4 mg/dL (ref 0.3–1.2)
Total Protein: 6.5 g/dL (ref 6.5–8.1)

## 2018-05-22 LAB — CBC WITH DIFFERENTIAL (CANCER CENTER ONLY)
Basophils Absolute: 0.1 10*3/uL (ref 0.0–0.1)
Basophils Relative: 2 %
Eosinophils Absolute: 0.1 10*3/uL (ref 0.0–0.5)
Eosinophils Relative: 4 %
HEMATOCRIT: 38 % — AB (ref 38.4–49.9)
HEMOGLOBIN: 13.1 g/dL (ref 13.0–17.1)
LYMPHS ABS: 1.2 10*3/uL (ref 0.9–3.3)
LYMPHS PCT: 33 %
MCH: 33.1 pg (ref 27.2–33.4)
MCHC: 34.6 g/dL (ref 32.0–36.0)
MCV: 95.7 fL (ref 79.3–98.0)
Monocytes Absolute: 0.4 10*3/uL (ref 0.1–0.9)
Monocytes Relative: 10 %
NEUTROS ABS: 1.9 10*3/uL (ref 1.5–6.5)
NEUTROS PCT: 51 %
Platelet Count: 155 10*3/uL (ref 140–400)
RBC: 3.97 MIL/uL — ABNORMAL LOW (ref 4.20–5.82)
RDW: 15.1 % — ABNORMAL HIGH (ref 11.0–14.6)
WBC Count: 3.7 10*3/uL — ABNORMAL LOW (ref 4.0–10.3)

## 2018-05-22 MED ORDER — PROCHLORPERAZINE MALEATE 10 MG PO TABS
10.0000 mg | ORAL_TABLET | Freq: Once | ORAL | Status: AC
  Administered 2018-05-22: 10 mg via ORAL

## 2018-05-22 MED ORDER — ZOLEDRONIC ACID 4 MG/100ML IV SOLN
4.0000 mg | Freq: Once | INTRAVENOUS | Status: AC
  Administered 2018-05-22: 4 mg via INTRAVENOUS
  Filled 2018-05-22: qty 100

## 2018-05-22 MED ORDER — DEXAMETHASONE 4 MG PO TABS
40.0000 mg | ORAL_TABLET | ORAL | Status: DC
  Administered 2018-05-22: 40 mg via ORAL

## 2018-05-22 MED ORDER — DEXAMETHASONE 4 MG PO TABS
ORAL_TABLET | ORAL | Status: AC
  Filled 2018-05-22: qty 10

## 2018-05-22 MED ORDER — BORTEZOMIB CHEMO SQ INJECTION 3.5 MG (2.5MG/ML)
0.9000 mg/m2 | Freq: Once | INTRAMUSCULAR | Status: AC
  Administered 2018-05-22: 1.75 mg via SUBCUTANEOUS
  Filled 2018-05-22: qty 0.7

## 2018-05-22 MED ORDER — PROCHLORPERAZINE MALEATE 10 MG PO TABS
ORAL_TABLET | ORAL | Status: AC
  Filled 2018-05-22: qty 1

## 2018-05-22 NOTE — Progress Notes (Signed)
[facility] [place] OFFICE PROGRESS NOTE   Diagnosis: Multiple myeloma  INTERVAL HISTORY:   Paul Green returns as scheduled.  He feels well.  Mild tightness in lower back.  No pain.  He is not taking pain medication.  He had an upper respiratory infection with upper airway congestion.  No fever.  The symptoms resolved over approximately 3 days. No neuropathy symptoms.  No diarrhea. He began the most recent course of Revlimid on 05/01/2018.  No jaw pain.  Objective:  Vital signs in last 24 hours:  Blood pressure (!) 133/100, pulse 90, temperature 98.2 F (36.8 C), temperature source Oral, resp. rate 20, height '5\' 6"'  (1.676 m), weight 187 lb 1.6 oz (84.9 kg), SpO2 97 %.    HEENT: No thrush or ulcers Resp: Lungs clear bilaterally Cardio: Regular rate and rhythm GI: No hepatosplenomegaly, nontender Vascular: No leg edema  Lab Results:  Lab Results  Component Value Date   WBC 3.7 (L) 05/22/2018   HGB 13.1 05/22/2018   HCT 38.0 (L) 05/22/2018   MCV 95.7 05/22/2018   PLT 155 05/22/2018   NEUTROABS 1.9 05/22/2018    CMP  Lab Results  Component Value Date   NA 140 05/22/2018   K 3.4 (L) 05/22/2018   CL 108 05/22/2018   CO2 25 05/22/2018   GLUCOSE 106 (H) 05/22/2018   BUN 17 05/22/2018   CREATININE 1.22 05/22/2018   CALCIUM 10.0 05/22/2018   PROT 6.5 05/22/2018   ALBUMIN 4.3 05/22/2018   AST 19 05/22/2018   ALT 26 05/22/2018   ALKPHOS 51 05/22/2018   BILITOT 0.4 05/22/2018   GFRNONAA >60 05/22/2018   GFRAA >60 05/22/2018   Lambda free light chains on 05/07/2018- 30.9  Medications: I have reviewed the patient's current medications.   Assessment/Plan: 1. Multiple myeloma   Anemia/thrombocytopenia  Bone marrow biopsy 01/25/2018-hypercellular bone marrow with extensive involvement by plasma cell neoplasm; flow cytometry with a population of cells with plasmacytic differentiation (13%), no monoclonal B-cell population identified, T cells with nonspecific  changes.  Cytogenetics-multiple rearrangements, FISH panel negative for cytogenetic abnormalities to be associated with multiple myeloma including loss of p53  01/24/2018 SPEP-noM spike,elevated serum lambda free light chains (432)  Dexamethasone 40 mg daily times 4 days beginning 01/25/2018  Velcade/dexamethasone weekly x3 followed by a 1 week break beginning3/26/2019(weekly dexamethasone initiated 02/05/2018, Revlimid initiated 02/09/2018)  Lambda light chains improved 02/26/2018  Cycle 2 RVD 03/05/2018 (Velcade dose reduced due to neutropenia), day 8 Velcade held and Revlimid discontinued secondary to recurrent severe neutropenia;day 15 Revlimid resumed x1 week, Velcade held;  Revlimid and Velcade placed on hold 04/02/2018 due to persistent neutropenia.  Bone marrow biopsy at [facility] 04/18/2018-hypercellular marrow, no evidence of myeloma  Cycle 3 RVD started 04/23/2018, Revlimid 5 mg daily for 14 days beginning 05/01/2018  Cycle 4 RVD 05/22/2018 2. Hypercalcemia status post pamidronate 01/24/2018, resolved 3. Diffuse abnormal bone marrow signal, compression fracture of L1 4. Back painsecondary to multiple myeloma, compression fracture.Improved. 5. Anorexia/weight loss-improved 6. 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 Severe neutropenia-recurrent after Revlimid resumed 03/19/2018, resolved      Disposition: Paul Green appears well.  He is tolerating the RVD well.  The neutrophil count remains adequate on the current dosing schedule.  He will complete another cycle of RVD beginning today.  He will continue Revlimid at the 5 mg dose with this cycle.  The plan is to escalate to 10 mg with the next cycle if he does not have hematologic toxicity  with this cycle.  The Velcade will also be dose escalated with the next cycle.  We again discussed the dictation for stem cell therapy.  He is scheduled to see Dr. Amalia Hailey in approximately 3 weeks.  He will  specifically discussed the potential survival benefit associated with stem cell therapy.  He will also ask whether he clearly has "high risk "myeloma.  He will continue monthly Zometa.  Betsy Coder, MD  05/22/2018  11:58 AM

## 2018-05-22 NOTE — Patient Instructions (Signed)
[facility] Discharge Instructions for Patients Receiving Chemotherapy  Today you received the following chemotherapy agents Velcade  To help prevent nausea and vomiting after your treatment, we encourage you to take your nausea medication as directed If you develop nausea and vomiting that is not controlled by your nausea medication, call the clinic.   BELOW ARE SYMPTOMS THAT SHOULD BE REPORTED IMMEDIATELY:  *FEVER GREATER THAN 100.5 F  *CHILLS WITH OR WITHOUT FEVER  NAUSEA AND VOMITING THAT IS NOT CONTROLLED WITH YOUR NAUSEA MEDICATION  *UNUSUAL SHORTNESS OF BREATH  *UNUSUAL BRUISING OR BLEEDING  TENDERNESS IN MOUTH AND THROAT WITH OR WITHOUT PRESENCE OF ULCERS  *URINARY PROBLEMS  *BOWEL PROBLEMS  UNUSUAL RASH Items with * indicate a potential emergency and should be followed up as soon as possible.  Feel free to call the clinic should you have any questions or concerns. The clinic phone number is [phone].  Please show the CHEMO ALERT CARD at check-in to the Emergency Department and triage nurse.   Zoledronic Acid injection (Hypercalcemia, Oncology) What is this medicine? ZOLEDRONIC ACID (ZOE le dron ik AS id) lowers the amount of calcium loss from bone. It is used to treat too much calcium in your blood from cancer. It is also used to prevent complications of cancer that has spread to the bone. This medicine may be used for other purposes; ask your health care provider or pharmacist if you have questions. COMMON BRAND NAME(S): Zometa What should I tell my health care provider before I take this medicine? They need to know if you have any of these conditions: -aspirin-sensitive asthma -cancer, especially if you are receiving medicines used to treat cancer -dental disease or wear dentures -infection -kidney disease -receiving corticosteroids like dexamethasone or prednisone -an unusual or allergic reaction to zoledronic acid, other medicines,  foods, dyes, or preservatives -pregnant or trying to get pregnant -breast-feeding How should I use this medicine? This medicine is for infusion into a vein. It is given by a health care professional in a hospital or clinic setting. Talk to your pediatrician regarding the use of this medicine in children. Special care may be needed. Overdosage: If you think you have taken too much of this medicine contact a poison control center or emergency room at once. NOTE: This medicine is only for you. Do not share this medicine with others. What if I miss a dose? It is important not to miss your dose. Call your doctor or health care professional if you are unable to keep an appointment. What may interact with this medicine? -certain antibiotics given by injection -NSAIDs, medicines for pain and inflammation, like ibuprofen or naproxen -some diuretics like bumetanide, furosemide -teriparatide -thalidomide This list may not describe all possible interactions. Give your health care provider a list of all the medicines, herbs, non-prescription drugs, or dietary supplements you use. Also tell them if you smoke, drink alcohol, or use illegal drugs. Some items may interact with your medicine. What should I watch for while using this medicine? Visit your doctor or health care professional for regular checkups. It may be some time before you see the benefit from this medicine. Do not stop taking your medicine unless your doctor tells you to. Your doctor may order blood tests or other tests to see how you are doing. Women should inform their doctor if they wish to become pregnant or think they might be pregnant. There is a potential for serious side effects to an unborn child. Talk to your   to your health care professional or pharmacist for more information. You should make sure that you get enough calcium and vitamin D while you are taking this medicine. Discuss the foods you eat and the vitamins you take with your health  care professional. Some people who take this medicine have severe bone, joint, and/or muscle pain. This medicine may also increase your risk for jaw problems or a broken thigh bone. Tell your doctor right away if you have severe pain in your jaw, bones, joints, or muscles. Tell your doctor if you have any pain that does not go away or that gets worse. Tell your dentist and dental surgeon that you are taking this medicine. You should not have major dental surgery while on this medicine. See your dentist to have a dental exam and fix any dental problems before starting this medicine. Take good care of your teeth while on this medicine. Make sure you see your dentist for regular follow-up appointments. What side effects may I notice from receiving this medicine? Side effects that you should report to your doctor or health care professional as soon as possible: -allergic reactions like skin rash, itching or hives, swelling of the face, lips, or tongue -anxiety, confusion, or depression -breathing problems -changes in vision -eye pain -feeling faint or lightheaded, falls -jaw pain, especially after dental work -mouth sores -muscle cramps, stiffness, or weakness -redness, blistering, peeling or loosening of the skin, including inside the mouth -trouble passing urine or change in the amount of urine Side effects that usually do not require medical attention (report to your doctor or health care professional if they continue or are bothersome): -bone, joint, or muscle pain -constipation -diarrhea -fever -hair loss -irritation at site where injected -loss of appetite -nausea, vomiting -stomach upset -trouble sleeping -trouble swallowing -weak or tired This list may not describe all possible side effects. Call your doctor for medical advice about side effects. You may report side effects to FDA at 1-800-FDA-1088. Where should I keep my medicine? This drug is given in a hospital or clinic and  will not be stored at home. NOTE: This sheet is a summary. It may not cover all possible information. If you have questions about this medicine, talk to your doctor, pharmacist, or health care provider.  2018 Elsevier/Gold Standard (2014-03-21 14:19:39)

## 2018-05-23 ENCOUNTER — Other Ambulatory Visit: Payer: Self-pay

## 2018-05-23 ENCOUNTER — Telehealth: Payer: Self-pay | Admitting: Cardiology

## 2018-05-23 DIAGNOSIS — C9000 Multiple myeloma not having achieved remission: Secondary | ICD-10-CM

## 2018-05-23 MED ORDER — LENALIDOMIDE 5 MG PO CAPS: ORAL_CAPSULE | ORAL | 0 refills | Status: DC

## 2018-05-23 NOTE — Telephone Encounter (Signed)
Received records from [place], Appt 05/30/18 @ 8:40PM. [place]

## 2018-05-28 ENCOUNTER — Inpatient Hospital Stay: Payer: BLUE CROSS/BLUE SHIELD

## 2018-05-28 VITALS — BP 138/97 | HR 86 | Temp 98.0°F | Resp 16

## 2018-05-28 DIAGNOSIS — C9000 Multiple myeloma not having achieved remission: Secondary | ICD-10-CM

## 2018-05-28 DIAGNOSIS — D649 Anemia, unspecified: Secondary | ICD-10-CM | POA: Diagnosis not present

## 2018-05-28 DIAGNOSIS — Z5111 Encounter for antineoplastic chemotherapy: Secondary | ICD-10-CM | POA: Diagnosis not present

## 2018-05-28 DIAGNOSIS — Z79899 Other long term (current) drug therapy: Secondary | ICD-10-CM | POA: Diagnosis not present

## 2018-05-28 LAB — CMP (CANCER CENTER ONLY)
ALBUMIN: 4.2 g/dL (ref 3.5–5.0)
ALK PHOS: 50 U/L (ref 38–126)
ALT: 22 U/L (ref 0–44)
AST: 15 U/L (ref 15–41)
Anion gap: 6 (ref 5–15)
BILIRUBIN TOTAL: 0.4 mg/dL (ref 0.3–1.2)
BUN: 17 mg/dL (ref 6–20)
CALCIUM: 9.6 mg/dL (ref 8.9–10.3)
CO2: 28 mmol/L (ref 22–32)
CREATININE: 1.19 mg/dL (ref 0.61–1.24)
Chloride: 108 mmol/L (ref 98–111)
GFR, Est AFR Am: >60 mL/min (ref >60)
GLUCOSE: 90 mg/dL (ref 70–99)
POTASSIUM: 4.3 mmol/L (ref 3.5–5.1)
Sodium: 142 mmol/L (ref 135–145)
TOTAL PROTEIN: 6.6 g/dL (ref 6.5–8.1)

## 2018-05-28 LAB — CBC WITH DIFFERENTIAL (CANCER CENTER ONLY)
BASOS ABS: 0 10*3/uL (ref 0.0–0.1)
BASOS PCT: 0 %
EOS ABS: 0.1 10*3/uL (ref 0.0–0.5)
EOS PCT: 4 %
HCT: 38.9 % (ref 38.4–49.9)
Hemoglobin: 13.7 g/dL (ref 13.0–17.1)
LYMPHS PCT: 32 %
Lymphs Abs: 1 10*3/uL (ref 0.9–3.3)
MCH: 33.2 pg (ref 27.2–33.4)
MCHC: 35.2 g/dL (ref 32.0–36.0)
MCV: 94.2 fL (ref 79.3–98.0)
Monocytes Absolute: 0.4 10*3/uL (ref 0.1–0.9)
Monocytes Relative: 11 %
Neutro Abs: 1.7 10*3/uL (ref 1.5–6.5)
Neutrophils Relative %: 53 %
PLATELETS: 172 10*3/uL (ref 140–400)
RBC: 4.13 MIL/uL — ABNORMAL LOW (ref 4.20–5.82)
RDW: 14.4 % (ref 11.0–14.6)
WBC: 3.1 10*3/uL — AB (ref 4.0–10.3)

## 2018-05-28 MED ORDER — PROCHLORPERAZINE MALEATE 10 MG PO TABS
ORAL_TABLET | ORAL | Status: AC
  Filled 2018-05-28: qty 1

## 2018-05-28 MED ORDER — DEXAMETHASONE 4 MG PO TABS
40.0000 mg | ORAL_TABLET | ORAL | Status: DC
  Administered 2018-05-28: 40 mg via ORAL

## 2018-05-28 MED ORDER — BORTEZOMIB CHEMO SQ INJECTION 3.5 MG (2.5MG/ML)
0.9000 mg/m2 | Freq: Once | INTRAMUSCULAR | Status: AC
  Administered 2018-05-28: 1.75 mg via SUBCUTANEOUS
  Filled 2018-05-28: qty 0.7

## 2018-05-28 MED ORDER — PROCHLORPERAZINE MALEATE 10 MG PO TABS
10.0000 mg | ORAL_TABLET | Freq: Once | ORAL | Status: AC
  Administered 2018-05-28: 10 mg via ORAL

## 2018-05-28 MED ORDER — DEXAMETHASONE 4 MG PO TABS
ORAL_TABLET | ORAL | Status: AC
  Filled 2018-05-28: qty 10

## 2018-05-28 NOTE — Patient Instructions (Signed)
[facility] Discharge Instructions for Patients Receiving Chemotherapy  Today you received the following chemotherapy agents Velcade  To help prevent nausea and vomiting after your treatment, we encourage you to take your nausea medication as directed If you develop nausea and vomiting that is not controlled by your nausea medication, call the clinic.   BELOW ARE SYMPTOMS THAT SHOULD BE REPORTED IMMEDIATELY:  *FEVER GREATER THAN 100.5 F  *CHILLS WITH OR WITHOUT FEVER  NAUSEA AND VOMITING THAT IS NOT CONTROLLED WITH YOUR NAUSEA MEDICATION  *UNUSUAL SHORTNESS OF BREATH  *UNUSUAL BRUISING OR BLEEDING  TENDERNESS IN MOUTH AND THROAT WITH OR WITHOUT PRESENCE OF ULCERS  *URINARY PROBLEMS  *BOWEL PROBLEMS  UNUSUAL RASH Items with * indicate a potential emergency and should be followed up as soon as possible.  Feel free to call the clinic should you have any questions or concerns. The clinic phone number is [phone].  Please show the CHEMO ALERT CARD at check-in to the Emergency Department and triage nurse.   Zoledronic Acid injection (Hypercalcemia, Oncology) What is this medicine? ZOLEDRONIC ACID (ZOE le dron ik AS id) lowers the amount of calcium loss from bone. It is used to treat too much calcium in your blood from cancer. It is also used to prevent complications of cancer that has spread to the bone. This medicine may be used for other purposes; ask your health care provider or pharmacist if you have questions. COMMON BRAND NAME(S): Zometa What should I tell my health care provider before I take this medicine? They need to know if you have any of these conditions: -aspirin-sensitive asthma -cancer, especially if you are receiving medicines used to treat cancer -dental disease or wear dentures -infection -kidney disease -receiving corticosteroids like dexamethasone or prednisone -an unusual or allergic reaction to zoledronic acid, other medicines,  foods, dyes, or preservatives -pregnant or trying to get pregnant -breast-feeding How should I use this medicine? This medicine is for infusion into a vein. It is given by a health care professional in a hospital or clinic setting. Talk to your pediatrician regarding the use of this medicine in children. Special care may be needed. Overdosage: If you think you have taken too much of this medicine contact a poison control center or emergency room at once. NOTE: This medicine is only for you. Do not share this medicine with others. What if I miss a dose? It is important not to miss your dose. Call your doctor or health care professional if you are unable to keep an appointment. What may interact with this medicine? -certain antibiotics given by injection -NSAIDs, medicines for pain and inflammation, like ibuprofen or naproxen -some diuretics like bumetanide, furosemide -teriparatide -thalidomide This list may not describe all possible interactions. Give your health care provider a list of all the medicines, herbs, non-prescription drugs, or dietary supplements you use. Also tell them if you smoke, drink alcohol, or use illegal drugs. Some items may interact with your medicine. What should I watch for while using this medicine? Visit your doctor or health care professional for regular checkups. It may be some time before you see the benefit from this medicine. Do not stop taking your medicine unless your doctor tells you to. Your doctor may order blood tests or other tests to see how you are doing. Women should inform their doctor if they wish to become pregnant or think they might be pregnant. There is a potential for serious side effects to an unborn child. Talk to your   to your health care professional or pharmacist for more information. You should make sure that you get enough calcium and vitamin D while you are taking this medicine. Discuss the foods you eat and the vitamins you take with your health  care professional. Some people who take this medicine have severe bone, joint, and/or muscle pain. This medicine may also increase your risk for jaw problems or a broken thigh bone. Tell your doctor right away if you have severe pain in your jaw, bones, joints, or muscles. Tell your doctor if you have any pain that does not go away or that gets worse. Tell your dentist and dental surgeon that you are taking this medicine. You should not have major dental surgery while on this medicine. See your dentist to have a dental exam and fix any dental problems before starting this medicine. Take good care of your teeth while on this medicine. Make sure you see your dentist for regular follow-up appointments. What side effects may I notice from receiving this medicine? Side effects that you should report to your doctor or health care professional as soon as possible: -allergic reactions like skin rash, itching or hives, swelling of the face, lips, or tongue -anxiety, confusion, or depression -breathing problems -changes in vision -eye pain -feeling faint or lightheaded, falls -jaw pain, especially after dental work -mouth sores -muscle cramps, stiffness, or weakness -redness, blistering, peeling or loosening of the skin, including inside the mouth -trouble passing urine or change in the amount of urine Side effects that usually do not require medical attention (report to your doctor or health care professional if they continue or are bothersome): -bone, joint, or muscle pain -constipation -diarrhea -fever -hair loss -irritation at site where injected -loss of appetite -nausea, vomiting -stomach upset -trouble sleeping -trouble swallowing -weak or tired This list may not describe all possible side effects. Call your doctor for medical advice about side effects. You may report side effects to FDA at 1-800-FDA-1088. Where should I keep my medicine? This drug is given in a hospital or clinic and  will not be stored at home. NOTE: This sheet is a summary. It may not cover all possible information. If you have questions about this medicine, talk to your doctor, pharmacist, or health care provider.  2018 Elsevier/Gold Standard (2014-03-21 14:19:39)

## 2018-05-29 ENCOUNTER — Other Ambulatory Visit: Payer: Self-pay | Admitting: Nurse Practitioner

## 2018-05-29 DIAGNOSIS — C9000 Multiple myeloma not having achieved remission: Secondary | ICD-10-CM

## 2018-05-30 ENCOUNTER — Ambulatory Visit (INDEPENDENT_AMBULATORY_CARE_PROVIDER_SITE_OTHER): Payer: BLUE CROSS/BLUE SHIELD | Admitting: Cardiology

## 2018-05-30 ENCOUNTER — Encounter: Payer: Self-pay | Admitting: Cardiology

## 2018-05-30 VITALS — BP 144/97 | HR 89 | Ht 66.0 in | Wt 188.4 lb

## 2018-05-30 DIAGNOSIS — E8881 Metabolic syndrome: Secondary | ICD-10-CM | POA: Diagnosis not present

## 2018-05-30 DIAGNOSIS — R002 Palpitations: Secondary | ICD-10-CM | POA: Diagnosis not present

## 2018-05-30 DIAGNOSIS — Z7189 Other specified counseling: Secondary | ICD-10-CM | POA: Diagnosis not present

## 2018-05-30 DIAGNOSIS — E781 Pure hyperglyceridemia: Secondary | ICD-10-CM

## 2018-05-30 DIAGNOSIS — I10 Essential (primary) hypertension: Secondary | ICD-10-CM | POA: Diagnosis not present

## 2018-05-30 MED ORDER — HYDROCHLOROTHIAZIDE 25 MG PO TABS: 25.0000 mg | ORAL_TABLET | Freq: Every day | ORAL | 3 refills | Status: DC

## 2018-05-30 NOTE — Progress Notes (Signed)
Cardiology Office Note:    Date:  05/30/2018   ID:  Kevan Ny, DOB [birth year], MRN [id number]  PCP:  [name], MD  Cardiologist:  Buford Dresser, MD PhD  Referring MD: [name], MD   Chief Complaint  Patient presents with  . New Patient (Initial Visit)    Hypertension problems     History of Present Illness:    Paul Green is a 57 y.o. male with a hx of multiple myeloma on active chemotherapy (revlamid/velcade/dexamethasone), back pain who is seen as a new consult at the request of Dr. Ardeth Perfect for evaluation of palpitations and chest heaviness.  BP has been borderline high for ten years. Now consistently 130s/90-100s, as high as 160/100 but returns to normal shortly after. Has been higher since starting chemotherapy. Has lost 30 lbs, BP not improved.  May 30: heaviness at night. Wife took his BP/HR the next day and noticed an abnormal heart rhythm. He noted that it feels funny. Since that time, has not had again. Was in the 3rd cycle of treatment, was holding velcade/revlamid at that time due to neutropenia. Was 2 days post decadron.  Risk factors: no history of heart problems. Had a stress test >10 years ago for fluttering/palpitations (attributed to allergy medicine), testing was normal. Never had echo. Lipids: never taken cholesterol meds, last LDL 96/HDL 26/TG 206. Never had an A1c.   Walk 2 miles/day. No CP or SOB, mild fatigue. Diet: lost 30 lbs with his multiple myeloma, trying to improve his diet. Never smoker, no alcohol.  FH: HLD: mother. HTN in both parents and 3 siblings (all overweight). Sister has diabetes. Grandparent had CVA, CAD >age 5. Parents are in their 23s. 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 No alcohol or tobacco.  PMH: multiple myeloma, in treatment  Past Surgical History:  Procedure Laterality Date  . VASECTOMY  1985    Current Medications: Current Outpatient Medications on File Prior to Visit    Medication Sig  . acyclovir (ZOVIRAX) 400 MG tablet TAKE 1 TABLET(400 MG) BY MOUTH DAILY  . aspirin 81 MG chewable tablet Chew 1 tablet (81 mg total) by mouth daily.  . Bortezomib (VELCADE IJ) Inject 1.75 mg as directed. On 3 weeks on 1 week off  . Dexamethasone (DECADRON PO) Take 40 mg by mouth. On 3 weeks off 1 week  . lenalidomide (REVLIMID) 5 MG capsule TAKE 1 CAPSULE BY MOUTH EVERY DAY FOR 14 DAYS ON THEN 7 DAYS OFF  . prochlorperazine (COMPAZINE) 10 MG tablet Take 10 mg by mouth. On 3 weeks off 1 week  . Zoledronic Acid 4 MG SOLR Inject 4 mg into the vein every 28 (twenty-eight) days.   No current facility-administered medications on file prior to visit.      Allergies:   Patient has no known allergies.   Social History   Socioeconomic History  . Marital status: Married    Spouse name: Not on file  . Number of children: Not on file  . Years of education: Not on file  . Highest education level: Not on file  Occupational History  . Not on file  Social Needs  . Financial resource strain: Not on file  . Food insecurity:    Worry: Not on file    Inability: Not on file  . Transportation needs:    Medical: Not on file    Non-medical: Not on file  Tobacco Use  . Smoking status: Never Smoker  . Smokeless tobacco: Never Used  Substance  and Sexual Activity  . Alcohol use: Yes    Comment: occ  . Drug use: Not on file  . Sexual activity: Not on file  Lifestyle  . Physical activity:    Days per week: Not on file    Minutes per session: Not on file  . Stress: Not on file  Relationships  . Social connections:    Talks on phone: Not on file    Gets together: Not on file    Attends religious service: Not on file    Active member of club or organization: Not on file    Attends meetings of clubs or organizations: Not on file    Relationship status: Not on file  Other Topics Concern  . Not on file  Social History Narrative  . Not on file  retired [employer] transport manager,  married, two children. No alcohol or tobacco.   Family History: The patient's family history includes Atrial fibrillation in his father; Diabetes in his sister; Melanoma in his brother and father; Skin cancer in his mother; Stroke in his other. There is no history of CAD. HLD: mother. HTN in both parents and 3 siblings (all overweight). Sister has diabetes. Grandparent had CVA, CAD >age 37. Parents are in their 64s.  ROS:   Please see the history of present illness.  Additional pertinent ROS: Review of Systems  Constitutional: Positive for weight loss. Negative for chills and fever.  HENT: Negative for ear pain and hearing loss.   Eyes: Negative for blurred vision, double vision and pain.  Respiratory: Negative for cough and shortness of breath.   Cardiovascular: Positive for chest pain and palpitations. Negative for orthopnea, claudication, leg swelling and PND.  Gastrointestinal: Negative for abdominal pain, blood in stool, diarrhea, heartburn and melena.  Genitourinary: Negative for dysuria and hematuria.  Musculoskeletal: Positive for back pain. Negative for falls.  Skin: Negative for itching and rash.  Neurological: Negative for focal weakness, loss of consciousness and headaches.  Endo/Heme/Allergies: Does not bruise/bleed easily.     EKGs/Labs/Other Studies Reviewed:    The following studies were reviewed today: Labs, prior ECGs No prior echo or stress test in our system.  EKG:  EKG is ordered today.  The ekg ordered today demonstrates normal sinus rhythm with IVCD (RBBB pattern)  Recent Labs: 01/24/2018: Magnesium 1.8; TSH 1.019 05/28/2018: ALT 22; BUN 17; Creatinine 1.19; Hemoglobin 13.7; Platelet Count 172; Potassium 4.3; Sodium 142  Recent Lipid Panel No results found for: CHOL, TRIG, HDL, CHOLHDL, VLDL, LDLCALC, LDLDIRECT  Physical Exam:    VS:  BP (!) 144/97   Pulse 89   Ht '5\' 6"'  (1.676 m)   Wt 188 lb 6.4 oz (85.5 kg)   BMI 30.41 kg/m     Wt Readings from Last  3 Encounters:  05/30/18 188 lb 6.4 oz (85.5 kg)  05/22/18 187 lb 1.6 oz (84.9 kg)  05/07/18 185 lb 8 oz (84.1 kg)    GEN: Well nourished, well developed in no acute distress HEENT: Normal NECK: No JVD; No carotid bruits LYMPHATICS: No lymphadenopathy CARDIAC: regular rhythm, normal S1 and S2, no murmurs, rubs, gallops. Radial and DP pulses 2+ bilaterally. RESPIRATORY:  Clear to auscultation without rales, wheezing or rhonchi  ABDOMEN: Soft, non-tender, non-distended MUSCULOSKELETAL:  No edema; No deformity  SKIN: Warm and dry NEUROLOGIC:  Alert and oriented x 3 PSYCHIATRIC:  Normal affect   ASSESSMENT:    1. Essential hypertension   2. Metabolic syndrome   3. Palpitations   4. Counseling  on health promotion and disease prevention   5. Hypertriglyceridemia    PLAN:    1. Hypertension: suspect that steroids are a component of this. Given the likely fluid retention, will start with HCTZ as a medication. -started HCTZ 25 mg daily -has labs followed routinely in the cancer center, will have renal function and K monitored. -asymptomatic  2. Metabolic syndrome: hypertriglyceridemia (206 fasting), hypertension, obesity (though losing weight) all risk factors that suggest metabolic syndrome -check U9N to screen. Ordered, can have this drawn with his routine labs  3. Palpitations, single episode of chest tightness: has not recurred, was occurring when he was being monitored for low blood counts -will hold on evaluation now. Instructed to call if symptoms return  4. Cardiovascular risk and prevention: discussed extensively -based on today's data, ASCVD 10 year risk is 11.1% -we discussed additional risk stratification tools, including CRP and CT calcium score. Discussed importance of diet, exercise, and overall lifestyle choices.  -we will reassess in 6 mos based on how he is feeling. Will do additional risk stratification at that time. If risk still elevated, will discuss a statin -on  aspirin now per onc team. Will defer any changes.  Plan for follow up: 6 mos to assess HTN and prevention  Medication Adjustments/Labs and Tests Ordered: Current medicines are reviewed at length with the patient today.  Concerns regarding medicines are outlined above.  Orders Placed This Encounter  Procedures  . HgB A1c  . EKG 12-Lead   Meds ordered this encounter  Medications  . hydrochlorothiazide (HYDRODIURIL) 25 MG tablet    Sig: Take 1 tablet (25 mg total) by mouth daily.    Dispense:  90 tablet    Refill:  3    Patient Instructions  Medication Instructions: Start: Hydrochlorothiazide 25 mg daily.    If you need a refill on your cardiac medications before your next appointment, please call your pharmacy.   Labwork: Your physician recommends that you lab work at routine follow up at Oncology office- A1C    Procedures/Testing: None  Follow-Up: Your physician wants you to follow-up in 6 months with Dr. Harrell Gave. You will receive a reminder letter in the mail two months in advance. If you don't receive a letter, please call our office at [phone] to schedule this follow-up appointment.   Special Instructions:    Thank you for choosing [facility] at [facility]!!       Signed, Buford Dresser, MD PhD 05/30/2018 12:36 PM    [place]

## 2018-05-30 NOTE — Patient Instructions (Signed)
Medication Instructions: Start: Hydrochlorothiazide 25 mg daily.    If you need a refill on your cardiac medications before your next appointment, please call your pharmacy.   Labwork: Your physician recommends that you lab work at routine follow up at Oncology office- A1C    Procedures/Testing: None  Follow-Up: Your physician wants you to follow-up in 6 months with Dr. Harrell Gave. You will receive a reminder letter in the mail two months in advance. If you don't receive a letter, please call our office at [phone] to schedule this follow-up appointment.   Special Instructions:    Thank you for choosing [facility] at [facility]!!

## 2018-06-04 ENCOUNTER — Inpatient Hospital Stay: Payer: BLUE CROSS/BLUE SHIELD

## 2018-06-04 ENCOUNTER — Encounter: Payer: Self-pay | Admitting: Nurse Practitioner

## 2018-06-04 ENCOUNTER — Other Ambulatory Visit: Payer: Self-pay | Admitting: Cardiology

## 2018-06-04 ENCOUNTER — Inpatient Hospital Stay (HOSPITAL_BASED_OUTPATIENT_CLINIC_OR_DEPARTMENT_OTHER): Payer: BLUE CROSS/BLUE SHIELD | Admitting: Nurse Practitioner

## 2018-06-04 VITALS — BP 130/102 | HR 107 | Temp 98.0°F | Resp 18 | Ht 66.0 in | Wt 185.6 lb

## 2018-06-04 VITALS — BP 122/103 | HR 93 | Resp 18

## 2018-06-04 DIAGNOSIS — C9000 Multiple myeloma not having achieved remission: Secondary | ICD-10-CM

## 2018-06-04 DIAGNOSIS — E8881 Metabolic syndrome: Secondary | ICD-10-CM | POA: Diagnosis not present

## 2018-06-04 DIAGNOSIS — D649 Anemia, unspecified: Secondary | ICD-10-CM | POA: Diagnosis not present

## 2018-06-04 DIAGNOSIS — D696 Thrombocytopenia, unspecified: Secondary | ICD-10-CM | POA: Diagnosis not present

## 2018-06-04 DIAGNOSIS — Z79899 Other long term (current) drug therapy: Secondary | ICD-10-CM

## 2018-06-04 DIAGNOSIS — R630 Anorexia: Secondary | ICD-10-CM

## 2018-06-04 DIAGNOSIS — M545 Low back pain: Secondary | ICD-10-CM

## 2018-06-04 DIAGNOSIS — Z5111 Encounter for antineoplastic chemotherapy: Secondary | ICD-10-CM | POA: Diagnosis not present

## 2018-06-04 LAB — CMP (CANCER CENTER ONLY)
ALT: 22 U/L (ref 0–44)
AST: 16 U/L (ref 15–41)
Albumin: 4.5 g/dL (ref 3.5–5.0)
Alkaline Phosphatase: 53 U/L (ref 38–126)
Anion gap: 9 (ref 5–15)
BILIRUBIN TOTAL: 0.5 mg/dL (ref 0.3–1.2)
BUN: 14 mg/dL (ref 6–20)
CALCIUM: 10.1 mg/dL (ref 8.9–10.3)
CHLORIDE: 101 mmol/L (ref 98–111)
CO2: 31 mmol/L (ref 22–32)
CREATININE: 1.43 mg/dL — AB (ref 0.61–1.24)
GFR, EST NON AFRICAN AMERICAN: 53 mL/min — AB (ref >60)
Glucose, Bld: 117 mg/dL — ABNORMAL HIGH (ref 70–99)
Potassium: 3.6 mmol/L (ref 3.5–5.1)
Sodium: 141 mmol/L (ref 135–145)
TOTAL PROTEIN: 6.9 g/dL (ref 6.5–8.1)

## 2018-06-04 LAB — CBC WITH DIFFERENTIAL (CANCER CENTER ONLY)
BASOS ABS: 0 10*3/uL (ref 0.0–0.1)
BASOS PCT: 0 %
EOS ABS: 0.2 10*3/uL (ref 0.0–0.5)
EOS PCT: 4 %
HCT: 43.2 % (ref 38.4–49.9)
HEMOGLOBIN: 15.3 g/dL (ref 13.0–17.1)
LYMPHS ABS: 1.1 10*3/uL (ref 0.9–3.3)
Lymphocytes Relative: 20 %
MCH: 33.2 pg (ref 27.2–33.4)
MCHC: 35.4 g/dL (ref 32.0–36.0)
MCV: 93.7 fL (ref 79.3–98.0)
Monocytes Absolute: 0.4 10*3/uL (ref 0.1–0.9)
Monocytes Relative: 7 %
Neutro Abs: 3.8 10*3/uL (ref 1.5–6.5)
Neutrophils Relative %: 69 %
Platelet Count: 188 10*3/uL (ref 140–400)
RBC: 4.61 MIL/uL (ref 4.20–5.82)
RDW: 14.3 % (ref 11.0–14.6)
WBC: 5.5 10*3/uL (ref 4.0–10.3)

## 2018-06-04 MED ORDER — PROCHLORPERAZINE MALEATE 10 MG PO TABS
10.0000 mg | ORAL_TABLET | Freq: Once | ORAL | Status: AC
  Administered 2018-06-04: 10 mg via ORAL

## 2018-06-04 MED ORDER — BORTEZOMIB CHEMO SQ INJECTION 3.5 MG (2.5MG/ML)
0.9000 mg/m2 | Freq: Once | INTRAMUSCULAR | Status: AC
  Administered 2018-06-04: 1.75 mg via SUBCUTANEOUS
  Filled 2018-06-04: qty 0.7

## 2018-06-04 MED ORDER — PROCHLORPERAZINE MALEATE 10 MG PO TABS
ORAL_TABLET | ORAL | Status: AC
Start: 2018-06-04 — End: ?
  Filled 2018-06-04: qty 1

## 2018-06-04 MED ORDER — DEXAMETHASONE 4 MG PO TABS
ORAL_TABLET | ORAL | Status: AC
  Filled 2018-06-04: qty 10

## 2018-06-04 MED ORDER — DEXAMETHASONE 4 MG PO TABS
40.0000 mg | ORAL_TABLET | ORAL | Status: DC
  Administered 2018-06-04: 40 mg via ORAL

## 2018-06-04 NOTE — Progress Notes (Addendum)
[place] OFFICE PROGRESS NOTE   Diagnosis: Multiple myeloma  INTERVAL HISTORY:   [name] returns as scheduled.  He completed cycle 4-week 2 Velcade 05/28/2018.  He began cycle 4 Revlimid 05/29/2018.  He feels well.  He denies nausea/vomiting.  No mouth sores.  No diarrhea.  No neuropathy symptoms.  He denies pain.  He has a good appetite.  He began hydrochlorothiazide last week.  Objective:  Vital signs in last 24 hours:  Blood pressure (!) 130/102, pulse (!) 107, temperature 98 F (36.7 C), temperature source Oral, resp. rate 18, height '5\' 6"'  (1.676 m), weight 185 lb 9.6 oz (84.2 kg), SpO2 98 %.    HEENT: No thrush or ulcers. Resp: Lungs clear bilaterally. Cardio: Regular rate and rhythm. GI: Abdomen soft and nontender.  No hepatospleno megaly. Vascular: No leg edema.  Calves soft and nontender.  Skin: No rash.   Lab Results:  Lab Results  Component Value Date   WBC 5.5 06/04/2018   HGB 15.3 06/04/2018   HCT 43.2 06/04/2018   MCV 93.7 06/04/2018   PLT 188 06/04/2018   NEUTROABS 3.8 06/04/2018    Imaging:  No results found.  Medications: I have reviewed the patient's current medications.  Assessment/Plan: 1. Multiple myeloma   Anemia/thrombocytopenia  Bone marrow biopsy 01/25/2018-hypercellular bone marrow with extensive involvement by plasma cell neoplasm; flow cytometry with a population of cells with plasmacytic differentiation (13%), no monoclonal B-cell population identified, T cells with nonspecific changes.  Cytogenetics-multiple rearrangements, FISH panel negative for cytogenetic abnormalities to be associated with multiple myeloma including loss of p53  01/24/2018 SPEP-noM spike,elevated serum lambda free light chains (432)  Dexamethasone 40 mg daily times 4 days beginning 01/25/2018  Velcade/dexamethasone weekly x3 followed by a 1 week break beginning3/26/2019(weekly dexamethasone initiated 02/05/2018, Revlimid initiated  02/09/2018)  Lambda light chains improved 02/26/2018  Cycle 2 RVD 03/05/2018 (Velcade dose reduced due to neutropenia), day 8 Velcade held and Revlimid discontinued secondary to recurrent severe neutropenia;day 15 Revlimid resumed x1 week, Velcade held;  Revlimid and Velcade placed on hold 04/02/2018 due to persistent neutropenia.  Bone marrow biopsy at [facility] 04/18/2018-hypercellular marrow, no evidence of myeloma  Cycle 3 RVD started 04/23/2018, Revlimid 5 mg dailyfor 14 days beginning 05/01/2018  Cycle 4 RVD 05/22/2018, Revlimid 5 mg daily for 14 days beginning 05/29/2018 2. Hypercalcemia status post pamidronate 01/24/2018, resolved 3. Diffuse abnormal bone marrow signal, compression fracture of L1 4. Back painsecondary to multiple myeloma, compression fracture.Improved. 5. Anorexia/weight loss-improved 6. 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 Severe neutropenia-recurrent after Revlimid resumed 03/19/2018, resolved     Disposition: Mr. Manard appears stable.  He is completing cycle 4 RVD.  We will follow-up on the light chains from today.  We reviewed the labs from today.  Blood counts are adequate for treatment.  Creatinine is mildly elevated.  This may be related to hydrochlorothiazide.  He is scheduled to have labs done at [facility] next week.  Revlimid dosing for the next cycle will be determined based on the creatinine.  He will return for lab, follow-up and cycle 5 RVD on 06/18/2018.  He will contact the office in the interim with any problems.  Patient seen with Dr. Benay Spice.    Ned Card ANP/GNP-BC   06/04/2018  12:19 PM  This was a shared visit with Ned Card.  Mr. Maxson appears well.  The plan is to continue RVD.  He says that he will most likely not agree to stem cell therapy.  I will discuss the case with Dr. Amalia Hailey prior to his return visit in 2 weeks.  Julieanne Manson, MD

## 2018-06-04 NOTE — Patient Instructions (Signed)
[facility] Discharge Instructions for Patients Receiving Chemotherapy  Today you received the following chemotherapy agents Velcade.  To help prevent nausea and vomiting after your treatment, we encourage you to take your nausea medication as directed.  If you develop nausea and vomiting that is not controlled by your nausea medication, call the clinic.   BELOW ARE SYMPTOMS THAT SHOULD BE REPORTED IMMEDIATELY:  *FEVER GREATER THAN 100.5 F  *CHILLS WITH OR WITHOUT FEVER  NAUSEA AND VOMITING THAT IS NOT CONTROLLED WITH YOUR NAUSEA MEDICATION  *UNUSUAL SHORTNESS OF BREATH  *UNUSUAL BRUISING OR BLEEDING  TENDERNESS IN MOUTH AND THROAT WITH OR WITHOUT PRESENCE OF ULCERS  *URINARY PROBLEMS  *BOWEL PROBLEMS  UNUSUAL RASH Items with * indicate a potential emergency and should be followed up as soon as possible.  Feel free to call the clinic should you have any questions or concerns. The clinic phone number is [phone].  Please show the CHEMO ALERT CARD at check-in to the Emergency Department and triage nurse.   

## 2018-06-05 LAB — KAPPA/LAMBDA LIGHT CHAINS
KAPPA, LAMDA LIGHT CHAIN RATIO: 0.79 (ref 0.26–1.65)
Kappa free light chain: 9.6 mg/L (ref 3.3–19.4)
Lambda free light chains: 12.2 mg/L (ref 5.7–26.3)

## 2018-06-06 ENCOUNTER — Encounter: Payer: Self-pay | Admitting: Oncology

## 2018-06-06 LAB — HEMOGLOBIN A1C
ESTIMATED AVERAGE GLUCOSE: 111 mg/dL
HEMOGLOBIN A1C: 5.5 % (ref 4.8–5.6)

## 2018-06-11 ENCOUNTER — Telehealth: Payer: Self-pay

## 2018-06-11 DIAGNOSIS — X58XXXA Exposure to other specified factors, initial encounter: Secondary | ICD-10-CM | POA: Diagnosis not present

## 2018-06-11 DIAGNOSIS — C9000 Multiple myeloma not having achieved remission: Secondary | ICD-10-CM | POA: Diagnosis not present

## 2018-06-11 DIAGNOSIS — M4850XA Collapsed vertebra, not elsewhere classified, site unspecified, initial encounter for fracture: Secondary | ICD-10-CM | POA: Diagnosis not present

## 2018-06-11 DIAGNOSIS — D801 Nonfamilial hypogammaglobulinemia: Secondary | ICD-10-CM | POA: Diagnosis not present

## 2018-06-11 DIAGNOSIS — D61818 Other pancytopenia: Secondary | ICD-10-CM | POA: Diagnosis not present

## 2018-06-11 DIAGNOSIS — Z79899 Other long term (current) drug therapy: Secondary | ICD-10-CM | POA: Diagnosis not present

## 2018-06-11 NOTE — Telephone Encounter (Signed)
Pt updated with lab results. Verbalized understanding but wanted to inform Dr. Harrell Gave that since starting HCTZ 25 mg on 7/25, his BP hasn't changed. He reports the following BP 142/100, 146/98, 148/100 and today's BP 150/100. He also states at last week oncology appointment his cr was elevated at 1.3. Repeat lab today was 1.1 and just wanted to inform that he will be having it checked for the next 3 Tuesday if she would like to review them. Routing to MD.

## 2018-06-12 ENCOUNTER — Other Ambulatory Visit: Payer: Self-pay | Admitting: Oncology

## 2018-06-12 DIAGNOSIS — C9000 Multiple myeloma not having achieved remission: Secondary | ICD-10-CM

## 2018-06-12 NOTE — Telephone Encounter (Signed)
Pt updated with Dr. Judeth Cornfield recommendation. Verbalized understanding with no further questions.

## 2018-06-12 NOTE — Telephone Encounter (Signed)
Thank you. I will keep an eye on the labs. I will follow the oncology plans, as steroids tend to add to fluid retention and can elevate blood pressure. We can add another medication if needed, but I will avoid any that might affect the kidneys.

## 2018-06-16 ENCOUNTER — Other Ambulatory Visit: Payer: Self-pay | Admitting: Oncology

## 2018-06-18 ENCOUNTER — Inpatient Hospital Stay: Payer: BLUE CROSS/BLUE SHIELD | Attending: Oncology

## 2018-06-18 ENCOUNTER — Inpatient Hospital Stay: Payer: BLUE CROSS/BLUE SHIELD

## 2018-06-18 ENCOUNTER — Inpatient Hospital Stay (HOSPITAL_BASED_OUTPATIENT_CLINIC_OR_DEPARTMENT_OTHER): Payer: BLUE CROSS/BLUE SHIELD | Admitting: Oncology

## 2018-06-18 VITALS — BP 136/86 | HR 83 | Temp 97.8°F | Resp 16 | Ht 66.0 in | Wt 188.6 lb

## 2018-06-18 DIAGNOSIS — Z5112 Encounter for antineoplastic immunotherapy: Secondary | ICD-10-CM | POA: Diagnosis not present

## 2018-06-18 DIAGNOSIS — C9000 Multiple myeloma not having achieved remission: Secondary | ICD-10-CM

## 2018-06-18 LAB — CBC WITH DIFFERENTIAL (CANCER CENTER ONLY)
BASOS ABS: 0 10*3/uL (ref 0.0–0.1)
BASOS PCT: 1 %
EOS ABS: 0.1 10*3/uL (ref 0.0–0.5)
Eosinophils Relative: 4 %
HCT: 39.2 % (ref 38.4–49.9)
HEMOGLOBIN: 13.8 g/dL (ref 13.0–17.1)
LYMPHS ABS: 0.9 10*3/uL (ref 0.9–3.3)
Lymphocytes Relative: 23 %
MCH: 33.1 pg (ref 27.2–33.4)
MCHC: 35.1 g/dL (ref 32.0–36.0)
MCV: 94.1 fL (ref 79.3–98.0)
Monocytes Absolute: 0.4 10*3/uL (ref 0.1–0.9)
Monocytes Relative: 10 %
NEUTROS PCT: 62 %
Neutro Abs: 2.4 10*3/uL (ref 1.5–6.5)
PLATELETS: 132 10*3/uL — AB (ref 140–400)
RBC: 4.16 MIL/uL — ABNORMAL LOW (ref 4.20–5.82)
RDW: 15.7 % — ABNORMAL HIGH (ref 11.0–14.6)
WBC Count: 3.9 10*3/uL — ABNORMAL LOW (ref 4.0–10.3)

## 2018-06-18 LAB — CMP (CANCER CENTER ONLY)
ALT: 22 U/L (ref 0–44)
AST: 17 U/L (ref 15–41)
Albumin: 4.1 g/dL (ref 3.5–5.0)
Alkaline Phosphatase: 40 U/L (ref 38–126)
Anion gap: 12 (ref 5–15)
BUN: 16 mg/dL (ref 6–20)
CO2: 22 mmol/L (ref 22–32)
Calcium: 9.3 mg/dL (ref 8.9–10.3)
Chloride: 109 mmol/L (ref 98–111)
Creatinine: 1.3 mg/dL — ABNORMAL HIGH (ref 0.61–1.24)
GFR, Est AFR Am: >60 mL/min (ref >60)
GFR, Estimated: 59 mL/min — ABNORMAL LOW (ref >60)
Glucose, Bld: 94 mg/dL (ref 70–99)
Potassium: 3.4 mmol/L — ABNORMAL LOW (ref 3.5–5.1)
Sodium: 143 mmol/L (ref 135–145)
Total Bilirubin: 0.7 mg/dL (ref 0.3–1.2)
Total Protein: 6.6 g/dL (ref 6.5–8.1)

## 2018-06-18 MED ORDER — SODIUM CHLORIDE 0.9 % IV SOLN
750.0000 mL | Freq: Once | INTRAVENOUS | Status: AC
  Administered 2018-06-18: 250 mL via INTRAVENOUS
  Filled 2018-06-18: qty 750

## 2018-06-18 MED ORDER — DEXAMETHASONE 4 MG PO TABS
ORAL_TABLET | ORAL | Status: AC
  Filled 2018-06-18: qty 10

## 2018-06-18 MED ORDER — DEXAMETHASONE 4 MG PO TABS
40.0000 mg | ORAL_TABLET | ORAL | Status: DC
  Administered 2018-06-18: 40 mg via ORAL

## 2018-06-18 MED ORDER — ZOLEDRONIC ACID 4 MG/100ML IV SOLN
4.0000 mg | Freq: Once | INTRAVENOUS | Status: AC
  Administered 2018-06-18: 4 mg via INTRAVENOUS
  Filled 2018-06-18: qty 100

## 2018-06-18 MED ORDER — LENALIDOMIDE 10 MG PO CAPS: ORAL_CAPSULE | ORAL | 0 refills | Status: DC

## 2018-06-18 MED ORDER — PROCHLORPERAZINE MALEATE 10 MG PO TABS
10.0000 mg | ORAL_TABLET | Freq: Once | ORAL | Status: AC
  Administered 2018-06-18: 10 mg via ORAL

## 2018-06-18 MED ORDER — BORTEZOMIB CHEMO SQ INJECTION 3.5 MG (2.5MG/ML)
1.3000 mg/m2 | Freq: Once | INTRAMUSCULAR | Status: AC
  Administered 2018-06-18: 2.5 mg via SUBCUTANEOUS
  Filled 2018-06-18: qty 1

## 2018-06-18 MED ORDER — PROCHLORPERAZINE MALEATE 10 MG PO TABS
ORAL_TABLET | ORAL | Status: AC
  Filled 2018-06-18: qty 1

## 2018-06-18 NOTE — Patient Instructions (Signed)
[facility] Discharge Instructions for Patients Receiving Chemotherapy  Today you received the following chemotherapy agents velcade/zometa  To help prevent nausea and vomiting after your treatment, we encourage you to take your nausea medication as directed  If you develop nausea and vomiting that is not controlled by your nausea medication, call the clinic.   BELOW ARE SYMPTOMS THAT SHOULD BE REPORTED IMMEDIATELY:  *FEVER GREATER THAN 100.5 F  *CHILLS WITH OR WITHOUT FEVER  NAUSEA AND VOMITING THAT IS NOT CONTROLLED WITH YOUR NAUSEA MEDICATION  *UNUSUAL SHORTNESS OF BREATH  *UNUSUAL BRUISING OR BLEEDING  TENDERNESS IN MOUTH AND THROAT WITH OR WITHOUT PRESENCE OF ULCERS  *URINARY PROBLEMS  *BOWEL PROBLEMS  UNUSUAL RASH Items with * indicate a potential emergency and should be followed up as soon as possible.  Feel free to call the clinic you have any questions or concerns. The clinic phone number is [phone].

## 2018-06-18 NOTE — Progress Notes (Signed)
[place] OFFICE PROGRESS NOTE   Diagnosis: Multiple myeloma  INTERVAL HISTORY:   Mr. Rayner returns as scheduled.  He feels well.  He completed another cycle of RVD on 06/04/2018.  He tolerated the treatment well.  No complaint. He saw Dr. Amalia Hailey last week.  Dr. Amalia Hailey recommends proceeding with stem cell therapy.  Mr. Costanzo has decided against stem cell transplant.  Objective:  Vital signs in last 24 hours:  Blood pressure 136/86, pulse 83, temperature 97.8 F (36.6 C), temperature source Oral, resp. rate 16, height '5\' 6"'  (1.676 m), weight 188 lb 9.6 oz (85.5 kg), SpO2 99 %.    HEENT: No thrush or ulcers Resp: Lungs clear bilaterally Cardio: Regular rate and rhythm GI: No hepatosplenomegaly Vascular: No leg edema   Lab Results:  Lab Results  Component Value Date   WBC 3.9 (L) 06/18/2018   HGB 13.8 06/18/2018   HCT 39.2 06/18/2018   MCV 94.1 06/18/2018   PLT 132 (L) 06/18/2018   NEUTROABS 2.4 06/18/2018    CMP  Lab Results  Component Value Date   NA 143 06/18/2018   K 3.4 (L) 06/18/2018   CL 109 06/18/2018   CO2 22 06/18/2018   GLUCOSE 94 06/18/2018   BUN 16 06/18/2018   CREATININE 1.30 (H) 06/18/2018   CALCIUM 9.3 06/18/2018   PROT 6.6 06/18/2018   ALBUMIN 4.1 06/18/2018   AST 17 06/18/2018   ALT 22 06/18/2018   ALKPHOS 40 06/18/2018   BILITOT 0.7 06/18/2018   GFRNONAA 59 (L) 06/18/2018   GFRAA >60 06/18/2018   Medications: I have reviewed the patient's current medications.   Assessment/Plan: 1. Multiple myeloma   Anemia/thrombocytopenia  Bone marrow biopsy 01/25/2018-hypercellular bone marrow with extensive involvement by plasma cell neoplasm; flow cytometry with a population of cells with plasmacytic differentiation (13%), no monoclonal B-cell population identified, T cells with nonspecific changes.  Cytogenetics-multiple rearrangements, FISH panel negative for cytogenetic abnormalities to be associated with multiple  myeloma including loss of p53  01/24/2018 SPEP-noM spike,elevated serum lambda free light chains (432)  Dexamethasone 40 mg daily times 4 days beginning 01/25/2018  Velcade/dexamethasone weekly x3 followed by a 1 week break beginning3/26/2019(weekly dexamethasone initiated 02/05/2018, Revlimid initiated 02/09/2018)  Lambda light chains improved 02/26/2018  Cycle 2 RVD 03/05/2018 (Velcade dose reduced due to neutropenia), day 8 Velcade held and Revlimid discontinued secondary to recurrent severe neutropenia;day 15 Revlimid resumed x1 week, Velcade held;  Revlimid and Velcade placed on hold 04/02/2018 due to persistent neutropenia.  Bone marrow biopsy at [facility] 04/18/2018-hypercellular marrow, no evidence of myeloma  Cycle 3 RVD started 04/23/2018, Revlimid 5 mg dailyfor 14 days beginning 05/01/2018  Cycle 4 RVD 05/22/2018, Revlimid 5 mg daily for 14 days beginning 05/29/2018  Cycle 5 RVD 06/18/2018, Velcade escalated to standard dosing, Revlimid increased to 10 mg 2. Hypercalcemia status post pamidronate 01/24/2018, resolved 3. Diffuse abnormal bone marrow signal, compression fracture of L1 4. Back painsecondary to multiple myeloma, compression fracture.Improved. 5. Anorexia/weight loss-improved 6. 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 Severe neutropenia-recurrent after Revlimid resumed 03/19/2018, resolved     Disposition: Mr. Moeller appears well.  He is tolerating the RVD well.  The hematologic indices are improved.  The Velcade will be increased to 1.3 mg/m and Revlimid to 10 mg with this cycle.  Mr. Neeson has decided against a stem cell transplant.  He agrees to stem cell collection.  He will schedule appointment with the [facility] transplant team.  We will coordinate the stem  cell harvest with the [facility] transplant team.  Mr. Tubby will complete another treatment with Zometa today.  He will return for an office visit on 07/16/2018.  25 minutes were  spent with the patient today.  The majority of the time was used for counseling and coordination of care.  Betsy Coder, MD  06/18/2018  10:08 AM

## 2018-06-18 NOTE — Addendum Note (Signed)
Addended by: Mathis Fare on: 06/18/2018 11:06 AM   Modules accepted: Orders

## 2018-06-23 ENCOUNTER — Other Ambulatory Visit: Payer: Self-pay | Admitting: Oncology

## 2018-06-25 ENCOUNTER — Inpatient Hospital Stay: Payer: BLUE CROSS/BLUE SHIELD

## 2018-06-25 VITALS — BP 147/102 | HR 98 | Temp 98.2°F | Resp 16

## 2018-06-25 DIAGNOSIS — C9000 Multiple myeloma not having achieved remission: Secondary | ICD-10-CM | POA: Diagnosis not present

## 2018-06-25 DIAGNOSIS — Z5112 Encounter for antineoplastic immunotherapy: Secondary | ICD-10-CM | POA: Diagnosis not present

## 2018-06-25 LAB — CBC WITH DIFFERENTIAL (CANCER CENTER ONLY)
Basophils Absolute: 0 10*3/uL (ref 0.0–0.1)
Basophils Relative: 0 %
Eosinophils Absolute: 0.1 10*3/uL (ref 0.0–0.5)
Eosinophils Relative: 3 %
HCT: 37.6 % — ABNORMAL LOW (ref 38.4–49.9)
Hemoglobin: 13.4 g/dL (ref 13.0–17.1)
LYMPHS ABS: 1.1 10*3/uL (ref 0.9–3.3)
LYMPHS PCT: 23 %
MCH: 32.9 pg (ref 27.2–33.4)
MCHC: 35.6 g/dL (ref 32.0–36.0)
MCV: 92.4 fL (ref 79.3–98.0)
Monocytes Absolute: 0.4 10*3/uL (ref 0.1–0.9)
Monocytes Relative: 9 %
Neutro Abs: 3 10*3/uL (ref 1.5–6.5)
Neutrophils Relative %: 65 %
Platelet Count: 182 10*3/uL (ref 140–400)
RBC: 4.07 MIL/uL — AB (ref 4.20–5.82)
RDW: 14.4 % (ref 11.0–14.6)
WBC: 4.6 10*3/uL (ref 4.0–10.3)

## 2018-06-25 LAB — CMP (CANCER CENTER ONLY)
ALT: 22 U/L (ref 0–44)
AST: 15 U/L (ref 15–41)
Albumin: 4 g/dL (ref 3.5–5.0)
Alkaline Phosphatase: 46 U/L (ref 38–126)
Anion gap: 10 (ref 5–15)
BUN: 19 mg/dL (ref 6–20)
CO2: 27 mmol/L (ref 22–32)
Calcium: 9.3 mg/dL (ref 8.9–10.3)
Chloride: 102 mmol/L (ref 98–111)
Creatinine: 1.24 mg/dL (ref 0.61–1.24)
GFR, Est AFR Am: >60 mL/min (ref >60)
Glucose, Bld: 82 mg/dL (ref 70–99)
POTASSIUM: 3.1 mmol/L — AB (ref 3.5–5.1)
Sodium: 139 mmol/L (ref 135–145)
Total Bilirubin: 0.5 mg/dL (ref 0.3–1.2)
Total Protein: 6.6 g/dL (ref 6.5–8.1)

## 2018-06-25 MED ORDER — PROCHLORPERAZINE MALEATE 10 MG PO TABS
10.0000 mg | ORAL_TABLET | Freq: Once | ORAL | Status: AC
  Administered 2018-06-25: 10 mg via ORAL

## 2018-06-25 MED ORDER — DEXAMETHASONE 4 MG PO TABS
40.0000 mg | ORAL_TABLET | ORAL | Status: DC
  Administered 2018-06-25: 40 mg via ORAL

## 2018-06-25 MED ORDER — PROCHLORPERAZINE MALEATE 10 MG PO TABS
ORAL_TABLET | ORAL | Status: AC
  Filled 2018-06-25: qty 1

## 2018-06-25 MED ORDER — DEXAMETHASONE 4 MG PO TABS
ORAL_TABLET | ORAL | Status: AC
  Filled 2018-06-25: qty 10

## 2018-06-25 MED ORDER — BORTEZOMIB CHEMO SQ INJECTION 3.5 MG (2.5MG/ML)
1.3000 mg/m2 | Freq: Once | INTRAMUSCULAR | Status: AC
  Administered 2018-06-25: 2.5 mg via SUBCUTANEOUS
  Filled 2018-06-25: qty 1

## 2018-06-25 NOTE — Patient Instructions (Signed)
[facility] Discharge Instructions for Patients Receiving Chemotherapy  Today you received the following chemotherapy agents Velcade To help prevent nausea and vomiting after your treatment, we encourage you to take your nausea medication as prescribed.   If you develop nausea and vomiting that is not controlled by your nausea medication, call the clinic.   BELOW ARE SYMPTOMS THAT SHOULD BE REPORTED IMMEDIATELY:  *FEVER GREATER THAN 100.5 F  *CHILLS WITH OR WITHOUT FEVER  NAUSEA AND VOMITING THAT IS NOT CONTROLLED WITH YOUR NAUSEA MEDICATION  *UNUSUAL SHORTNESS OF BREATH  *UNUSUAL BRUISING OR BLEEDING  TENDERNESS IN MOUTH AND THROAT WITH OR WITHOUT PRESENCE OF ULCERS  *URINARY PROBLEMS  *BOWEL PROBLEMS  UNUSUAL RASH Items with * indicate a potential emergency and should be followed up as soon as possible.  Feel free to call the clinic should you have any questions or concerns. The clinic phone number is [phone].  Please show the CHEMO ALERT CARD at check-in to the Emergency Department and triage nurse.   

## 2018-06-25 NOTE — Progress Notes (Signed)
Per Dr Benay Spice may proceed with VElcade today with elevated BP.  Diastolic > 672.  Pt instructed to contact his cardiologist this week to adjust/change medications.

## 2018-06-30 ENCOUNTER — Other Ambulatory Visit: Payer: Self-pay | Admitting: Oncology

## 2018-07-02 ENCOUNTER — Inpatient Hospital Stay: Payer: BLUE CROSS/BLUE SHIELD

## 2018-07-02 ENCOUNTER — Telehealth: Payer: Self-pay

## 2018-07-02 VITALS — BP 145/109 | HR 93 | Temp 98.4°F | Resp 16

## 2018-07-02 DIAGNOSIS — C9000 Multiple myeloma not having achieved remission: Secondary | ICD-10-CM | POA: Diagnosis not present

## 2018-07-02 DIAGNOSIS — Z5112 Encounter for antineoplastic immunotherapy: Secondary | ICD-10-CM | POA: Diagnosis not present

## 2018-07-02 LAB — CMP (CANCER CENTER ONLY)
ALT: 36 U/L (ref 0–44)
AST: 23 U/L (ref 15–41)
Albumin: 4.3 g/dL (ref 3.5–5.0)
Alkaline Phosphatase: 40 U/L (ref 38–126)
Anion gap: 8 (ref 5–15)
BUN: 16 mg/dL (ref 6–20)
CHLORIDE: 105 mmol/L (ref 98–111)
CO2: 30 mmol/L (ref 22–32)
Calcium: 10.5 mg/dL — ABNORMAL HIGH (ref 8.9–10.3)
Creatinine: 1.34 mg/dL — ABNORMAL HIGH (ref 0.61–1.24)
GFR, EST NON AFRICAN AMERICAN: 57 mL/min — AB (ref >60)
Glucose, Bld: 90 mg/dL (ref 70–99)
POTASSIUM: 3.5 mmol/L (ref 3.5–5.1)
SODIUM: 143 mmol/L (ref 135–145)
Total Bilirubin: 0.8 mg/dL (ref 0.3–1.2)
Total Protein: 6.8 g/dL (ref 6.5–8.1)

## 2018-07-02 LAB — CBC WITH DIFFERENTIAL (CANCER CENTER ONLY)
Basophils Absolute: 0 10*3/uL (ref 0.0–0.1)
Basophils Relative: 1 %
Eosinophils Absolute: 0.1 10*3/uL (ref 0.0–0.5)
Eosinophils Relative: 3 %
HCT: 39.1 % (ref 38.4–49.9)
HEMOGLOBIN: 13.7 g/dL (ref 13.0–17.1)
LYMPHS ABS: 0.8 10*3/uL — AB (ref 0.9–3.3)
LYMPHS PCT: 24 %
MCH: 32.9 pg (ref 27.2–33.4)
MCHC: 35 g/dL (ref 32.0–36.0)
MCV: 94 fL (ref 79.3–98.0)
Monocytes Absolute: 0.9 10*3/uL (ref 0.1–0.9)
Monocytes Relative: 26 %
NEUTROS PCT: 46 %
Neutro Abs: 1.5 10*3/uL (ref 1.5–6.5)
Platelet Count: 149 10*3/uL (ref 140–400)
RBC: 4.16 MIL/uL — AB (ref 4.20–5.82)
RDW: 14.7 % — ABNORMAL HIGH (ref 11.0–14.6)
WBC: 3.3 10*3/uL — AB (ref 4.0–10.3)

## 2018-07-02 MED ORDER — PROCHLORPERAZINE MALEATE 10 MG PO TABS
ORAL_TABLET | ORAL | Status: AC
  Filled 2018-07-02: qty 1

## 2018-07-02 MED ORDER — DEXAMETHASONE 4 MG PO TABS
40.0000 mg | ORAL_TABLET | ORAL | Status: DC
  Administered 2018-07-02: 40 mg via ORAL

## 2018-07-02 MED ORDER — PROCHLORPERAZINE MALEATE 10 MG PO TABS
10.0000 mg | ORAL_TABLET | Freq: Once | ORAL | Status: AC
  Administered 2018-07-02: 10 mg via ORAL

## 2018-07-02 MED ORDER — BORTEZOMIB CHEMO SQ INJECTION 3.5 MG (2.5MG/ML)
1.3000 mg/m2 | Freq: Once | INTRAMUSCULAR | Status: AC
  Administered 2018-07-02: 2.5 mg via SUBCUTANEOUS
  Filled 2018-07-02: qty 1

## 2018-07-02 MED ORDER — POTASSIUM CHLORIDE CRYS ER 20 MEQ PO TBCR: 20.0000 meq | EXTENDED_RELEASE_TABLET | Freq: Every day | ORAL | 1 refills | Status: DC

## 2018-07-02 MED ORDER — DEXAMETHASONE 4 MG PO TABS
ORAL_TABLET | ORAL | Status: AC
  Filled 2018-07-02: qty 10

## 2018-07-02 NOTE — Telephone Encounter (Addendum)
Pt voiced understanding of message, will start potassium pills, pt potassium 3.5 this visit. Per MD, plan to remain same, pt to take 53meq daily. Script sent to pharmacy    ----- Message from [name], MD sent at 06/27/2018  5:55 PM EDT ----- Please call patient potassium is low, likely secondary to HCTZ, start potassium chloride 20 mEq daily, repeat potassium next visit

## 2018-07-02 NOTE — Patient Instructions (Signed)
[facility] Discharge Instructions for Patients Receiving Chemotherapy  Today you received the following chemotherapy agents Velcade To help prevent nausea and vomiting after your treatment, we encourage you to take your nausea medication as prescribed.   If you develop nausea and vomiting that is not controlled by your nausea medication, call the clinic.   BELOW ARE SYMPTOMS THAT SHOULD BE REPORTED IMMEDIATELY:  *FEVER GREATER THAN 100.5 F  *CHILLS WITH OR WITHOUT FEVER  NAUSEA AND VOMITING THAT IS NOT CONTROLLED WITH YOUR NAUSEA MEDICATION  *UNUSUAL SHORTNESS OF BREATH  *UNUSUAL BRUISING OR BLEEDING  TENDERNESS IN MOUTH AND THROAT WITH OR WITHOUT PRESENCE OF ULCERS  *URINARY PROBLEMS  *BOWEL PROBLEMS  UNUSUAL RASH Items with * indicate a potential emergency and should be followed up as soon as possible.  Feel free to call the clinic should you have any questions or concerns. The clinic phone number is [phone].  Please show the CHEMO ALERT CARD at check-in to the Emergency Department and triage nurse.   

## 2018-07-02 NOTE — Progress Notes (Signed)
Per pt he has seen cardiologist who is aware of high SBP.  Prescribed HCTZ and pt encouraged by RN to follow back up with cardiologist.  Pt to receive velcade today.  Pharmacy aware

## 2018-07-03 LAB — KAPPA/LAMBDA LIGHT CHAINS
Kappa free light chain: 11 mg/L (ref 3.3–19.4)
Kappa, lambda light chain ratio: 0.8 (ref 0.26–1.65)
Lambda free light chains: 13.8 mg/L (ref 5.7–26.3)

## 2018-07-11 ENCOUNTER — Other Ambulatory Visit: Payer: Self-pay

## 2018-07-11 ENCOUNTER — Other Ambulatory Visit: Payer: Self-pay | Admitting: Oncology

## 2018-07-11 DIAGNOSIS — C9000 Multiple myeloma not having achieved remission: Secondary | ICD-10-CM

## 2018-07-11 MED ORDER — LENALIDOMIDE 10 MG PO CAPS: ORAL_CAPSULE | ORAL | 0 refills | Status: DC

## 2018-07-14 ENCOUNTER — Other Ambulatory Visit: Payer: Self-pay | Admitting: Oncology

## 2018-07-16 ENCOUNTER — Encounter: Payer: Self-pay | Admitting: Nurse Practitioner

## 2018-07-16 ENCOUNTER — Inpatient Hospital Stay (HOSPITAL_BASED_OUTPATIENT_CLINIC_OR_DEPARTMENT_OTHER): Payer: BLUE CROSS/BLUE SHIELD | Admitting: Nurse Practitioner

## 2018-07-16 ENCOUNTER — Telehealth: Payer: Self-pay | Admitting: Nurse Practitioner

## 2018-07-16 ENCOUNTER — Inpatient Hospital Stay: Payer: BLUE CROSS/BLUE SHIELD | Attending: Oncology

## 2018-07-16 ENCOUNTER — Inpatient Hospital Stay: Payer: BLUE CROSS/BLUE SHIELD

## 2018-07-16 VITALS — BP 136/100 | HR 87

## 2018-07-16 DIAGNOSIS — D709 Neutropenia, unspecified: Secondary | ICD-10-CM | POA: Diagnosis not present

## 2018-07-16 DIAGNOSIS — Z23 Encounter for immunization: Secondary | ICD-10-CM

## 2018-07-16 DIAGNOSIS — C9000 Multiple myeloma not having achieved remission: Secondary | ICD-10-CM | POA: Diagnosis not present

## 2018-07-16 DIAGNOSIS — Z79899 Other long term (current) drug therapy: Secondary | ICD-10-CM | POA: Diagnosis not present

## 2018-07-16 DIAGNOSIS — I10 Essential (primary) hypertension: Secondary | ICD-10-CM | POA: Insufficient documentation

## 2018-07-16 DIAGNOSIS — Z5111 Encounter for antineoplastic chemotherapy: Secondary | ICD-10-CM | POA: Insufficient documentation

## 2018-07-16 LAB — CBC WITH DIFFERENTIAL (CANCER CENTER ONLY)
BASOS PCT: 2 %
Basophils Absolute: 0 10*3/uL (ref 0.0–0.1)
Eosinophils Absolute: 0.1 10*3/uL (ref 0.0–0.5)
Eosinophils Relative: 3 %
HEMATOCRIT: 38.2 % — AB (ref 38.4–49.9)
HEMOGLOBIN: 13.3 g/dL (ref 13.0–17.1)
LYMPHS PCT: 32 %
Lymphs Abs: 0.9 10*3/uL (ref 0.9–3.3)
MCH: 33.3 pg (ref 27.2–33.4)
MCHC: 34.7 g/dL (ref 32.0–36.0)
MCV: 95.9 fL (ref 79.3–98.0)
MONOS PCT: 20 %
Monocytes Absolute: 0.5 10*3/uL (ref 0.1–0.9)
NEUTROS PCT: 43 %
Neutro Abs: 1.2 10*3/uL — ABNORMAL LOW (ref 1.5–6.5)
PLATELETS: 159 10*3/uL (ref 140–400)
RBC: 3.98 MIL/uL — ABNORMAL LOW (ref 4.20–5.82)
RDW: 16.1 % — AB (ref 11.0–14.6)
WBC Count: 2.7 10*3/uL — ABNORMAL LOW (ref 4.0–10.3)

## 2018-07-16 LAB — CMP (CANCER CENTER ONLY)
ALBUMIN: 4.5 g/dL (ref 3.5–5.0)
ALT: 28 U/L (ref 0–44)
ANION GAP: 10 (ref 5–15)
AST: 23 U/L (ref 15–41)
Alkaline Phosphatase: 37 U/L — ABNORMAL LOW (ref 38–126)
BUN: 17 mg/dL (ref 6–20)
CHLORIDE: 107 mmol/L (ref 98–111)
CO2: 25 mmol/L (ref 22–32)
Calcium: 10.1 mg/dL (ref 8.9–10.3)
Creatinine: 1.31 mg/dL — ABNORMAL HIGH (ref 0.61–1.24)
GFR, Est AFR Am: >60 mL/min (ref >60)
GFR, Estimated: 59 mL/min — ABNORMAL LOW (ref >60)
GLUCOSE: 93 mg/dL (ref 70–99)
POTASSIUM: 4 mmol/L (ref 3.5–5.1)
Sodium: 142 mmol/L (ref 135–145)
TOTAL PROTEIN: 6.6 g/dL (ref 6.5–8.1)
Total Bilirubin: 0.9 mg/dL (ref 0.3–1.2)

## 2018-07-16 MED ORDER — PROCHLORPERAZINE MALEATE 10 MG PO TABS
10.0000 mg | ORAL_TABLET | Freq: Once | ORAL | Status: AC
  Administered 2018-07-16: 10 mg via ORAL

## 2018-07-16 MED ORDER — DEXAMETHASONE 4 MG PO TABS
40.0000 mg | ORAL_TABLET | ORAL | Status: DC
  Administered 2018-07-16: 40 mg via ORAL

## 2018-07-16 MED ORDER — INFLUENZA VAC SPLIT QUAD 0.5 ML IM SUSY
0.5000 mL | PREFILLED_SYRINGE | Freq: Once | INTRAMUSCULAR | Status: AC
  Administered 2018-07-16: 0.5 mL via INTRAMUSCULAR

## 2018-07-16 MED ORDER — PROCHLORPERAZINE MALEATE 10 MG PO TABS
ORAL_TABLET | ORAL | Status: AC
  Filled 2018-07-16: qty 1

## 2018-07-16 MED ORDER — BORTEZOMIB CHEMO SQ INJECTION 3.5 MG (2.5MG/ML)
1.3000 mg/m2 | Freq: Once | INTRAMUSCULAR | Status: AC
  Administered 2018-07-16: 2.5 mg via SUBCUTANEOUS
  Filled 2018-07-16: qty 1

## 2018-07-16 MED ORDER — DEXAMETHASONE 4 MG PO TABS
ORAL_TABLET | ORAL | Status: AC
  Filled 2018-07-16: qty 10

## 2018-07-16 MED ORDER — INFLUENZA VAC SPLIT QUAD 0.5 ML IM SUSY
PREFILLED_SYRINGE | INTRAMUSCULAR | Status: AC
  Filled 2018-07-16: qty 0.5

## 2018-07-16 MED ORDER — ACYCLOVIR 400 MG PO TABS: ORAL_TABLET | ORAL | 3 refills | Status: DC

## 2018-07-16 NOTE — Patient Instructions (Signed)
[facility] Discharge Instructions for Patients Receiving Chemotherapy  Today you received the following chemotherapy agents Velcade.  To help prevent nausea and vomiting after your treatment, we encourage you to take your nausea medication as directed.  If you develop nausea and vomiting that is not controlled by your nausea medication, call the clinic.   BELOW ARE SYMPTOMS THAT SHOULD BE REPORTED IMMEDIATELY:  *FEVER GREATER THAN 100.5 F  *CHILLS WITH OR WITHOUT FEVER  NAUSEA AND VOMITING THAT IS NOT CONTROLLED WITH YOUR NAUSEA MEDICATION  *UNUSUAL SHORTNESS OF BREATH  *UNUSUAL BRUISING OR BLEEDING  TENDERNESS IN MOUTH AND THROAT WITH OR WITHOUT PRESENCE OF ULCERS  *URINARY PROBLEMS  *BOWEL PROBLEMS  UNUSUAL RASH Items with * indicate a potential emergency and should be followed up as soon as possible.  Feel free to call the clinic should you have any questions or concerns. The clinic phone number is [phone].  Please show the CHEMO ALERT CARD at check-in to the Emergency Department and triage nurse.   

## 2018-07-16 NOTE — Progress Notes (Signed)
[place] OFFICE PROGRESS NOTE   Diagnosis: Multiple myeloma  INTERVAL HISTORY:   [name] returns as scheduled.  He completed cycle 5 RVD beginning 06/18/2018.  He overall is feeling well.  He denies nausea/vomiting.  No mouth sores but does note mouth tenderness.  No diarrhea or constipation.  No neuropathy symptoms.  Objective:  Vital signs in last 24 hours:  Blood pressure (!) 129/102, pulse 91, temperature 98 F (36.7 C), temperature source Oral, resp. rate 18, height '5\' 6"'  (1.676 m), weight 190 lb 9.6 oz (86.5 kg), SpO2 100 %.    HEENT: No thrush or ulcers. Resp: Lungs clear bilaterally. Cardio: Regular rate and rhythm. GI: Abdomen soft and nontender.  No hepatomegaly. Vascular: No leg edema.   Lab Results:  Lab Results  Component Value Date   WBC 2.7 (L) 07/16/2018   HGB 13.3 07/16/2018   HCT 38.2 (L) 07/16/2018   MCV 95.9 07/16/2018   PLT 159 07/16/2018   NEUTROABS 1.2 (L) 07/16/2018    Imaging:  No results found.  Medications: I have reviewed the patient's current medications.  Assessment/Plan: 1. Multiple myeloma   Anemia/thrombocytopenia  Bone marrow biopsy 01/25/2018-hypercellular bone marrow with extensive involvement by plasma cell neoplasm; flow cytometry with a population of cells with plasmacytic differentiation (13%), no monoclonal B-cell population identified, T cells with nonspecific changes.  Cytogenetics-multiple rearrangements, FISH panel negative for cytogenetic abnormalities to be associated with multiple myeloma including loss of p53  01/24/2018 SPEP-noM spike,elevated serum lambda free light chains (432)  Dexamethasone 40 mg daily times 4 days beginning 01/25/2018  Velcade/dexamethasone weekly x3 followed by a 1 week break beginning3/26/2019(weekly dexamethasone initiated 02/05/2018, Revlimid initiated 02/09/2018)  Lambda light chains improved 02/26/2018  Cycle 2 RVD 03/05/2018 (Velcade dose reduced due to  neutropenia), day 8 Velcade held and Revlimid discontinued secondary to recurrent severe neutropenia;day 15 Revlimid resumed x1 week, Velcade held;  Revlimid and Velcade placed on hold 04/02/2018 due to persistent neutropenia.  Bone marrow biopsy at [facility] 04/18/2018-hypercellular marrow, no evidence of myeloma  Cycle 3 RVD started 04/23/2018, Revlimid 5 mg dailyfor 14 days beginning 05/01/2018  Cycle 4 RVD 05/22/2018,Revlimid 5 mg daily for 14 days beginning 05/29/2018  Cycle 5 RVD 06/18/2018, Velcade escalated to standard dosing, Revlimid increased to 10 mg  Cycle 6 RVD 07/16/2018 2. Hypercalcemia status post pamidronate 01/24/2018, resolved 3. Diffuse abnormal bone marrow signal, compression fracture of L1 4. Back painsecondary to multiple myeloma, compression fracture.Improved. 5. Anorexia/weight loss-improved 6. 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 Severe neutropenia-recurrent after Revlimid resumed 03/19/2018, resolved    Disposition: [name] appears stable.  He has completed 5 cycles of RVD.  Plan to proceed with cycle 6 today as scheduled.  We reviewed the CBC from today.  He has mild neutropenia.  Counts are adequate for treatment.  Repeat CBC in 1 week.  He last received Zometa 06/18/2018.  We will adjust the interval from monthly to every 3 months.  He will receive the influenza vaccine today.  We discussed the elevated blood pressure.  He reports readings are lower at home.  He will continue follow-up with cardiology.  He will return for lab and Velcade in 1 week and 2 weeks.  We will see him in follow-up in 4 weeks.  He will contact the office in the interim with any problems.  Patient seen with Dr. Benay Green.    Paul Green ANP/GNP-BC   07/16/2018  12:04 PM  This was a shared visit with  Paul Green.  [name] appears stable.  He continues RVD treatment for multiple myeloma.  He will continue Zometa.  He is scheduling stem  cell harvest at [facility].  We will wait on recommendations from the [facility] team regarding a break in the RVD regimen.  He will begin cycle 6 RVD today.  Julieanne Manson, MD

## 2018-07-16 NOTE — Telephone Encounter (Signed)
Scheduled appt per 9/10 los - gave patient AVS and calender per los.   

## 2018-07-19 ENCOUNTER — Encounter: Payer: Self-pay | Admitting: Oncology

## 2018-07-19 ENCOUNTER — Encounter: Payer: Self-pay | Admitting: Nurse Practitioner

## 2018-07-25 ENCOUNTER — Inpatient Hospital Stay: Payer: BLUE CROSS/BLUE SHIELD

## 2018-07-26 ENCOUNTER — Telehealth: Payer: Self-pay | Admitting: Cardiology

## 2018-07-26 NOTE — Telephone Encounter (Signed)
Pt sts that his cancer treatments have been on hold since 9/10 (including his steroid). He will continue taking all of his other meds. He is scheduled to have a stem cell treatment from 9/25-10/9.  Pt sts that he does monitor his BP daily. After holding Dexamethasone since 9/10 his BP has still remained elevated. 140's/100's.  Adv pt that I will send an update to Dr.Christopher and we will call back with her response.

## 2018-07-26 NOTE — Telephone Encounter (Signed)
N Message:     Pt says he is still concerned that his blood pressure have not dropped. It is averaging 140/105. His oncologist have added 20 meq of Potassium ,pt's Potassium had dropped.

## 2018-07-28 ENCOUNTER — Other Ambulatory Visit: Payer: Self-pay | Admitting: Oncology

## 2018-07-29 MED ORDER — AMLODIPINE BESYLATE 5 MG PO TABS: 5.0000 mg | ORAL_TABLET | Freq: Every day | ORAL | 3 refills | Status: DC

## 2018-07-29 NOTE — Telephone Encounter (Signed)
Pt aware of Dr.Christopher's recommendation. Start amlodipine 5 mg daily for him, but I would wait until after the stem cell therapy.   Pt is agreeable. Rx sent to pt's pharmacy for Amlodipine 5mg  daily #[id number] He will start once his stem cell therapy is complete. Adv pt to continue to monitor his BP daily after starting and to contact our office if his BP remains elevated.  Pt verbalized understanding.

## 2018-07-29 NOTE — Telephone Encounter (Signed)
Thank you for letting me know that the blood pressures are still elevated. I'd like to start amlodipine 5 mg daily for him, but I would wait until after the stem cell therapy. Please let me know if you'd like [place] to order the amlodipine or if it is something that your office feels comfortable ordering. Thank you!

## 2018-07-30 DIAGNOSIS — Z7682 Awaiting organ transplant status: Secondary | ICD-10-CM | POA: Diagnosis not present

## 2018-07-30 DIAGNOSIS — Z79899 Other long term (current) drug therapy: Secondary | ICD-10-CM | POA: Diagnosis not present

## 2018-07-30 DIAGNOSIS — Z01810 Encounter for preprocedural cardiovascular examination: Secondary | ICD-10-CM | POA: Diagnosis not present

## 2018-07-30 DIAGNOSIS — C9000 Multiple myeloma not having achieved remission: Secondary | ICD-10-CM | POA: Diagnosis not present

## 2018-07-30 DIAGNOSIS — D7589 Other specified diseases of blood and blood-forming organs: Secondary | ICD-10-CM | POA: Diagnosis not present

## 2018-07-31 ENCOUNTER — Encounter: Payer: Self-pay | Admitting: Nurse Practitioner

## 2018-07-31 ENCOUNTER — Telehealth: Payer: Self-pay | Admitting: Oncology

## 2018-07-31 NOTE — Telephone Encounter (Signed)
Spoke with patient scheduled appts per 9/25 sch message - pt is aware of updates.

## 2018-08-01 ENCOUNTER — Inpatient Hospital Stay: Payer: BLUE CROSS/BLUE SHIELD

## 2018-08-01 DIAGNOSIS — C9000 Multiple myeloma not having achieved remission: Secondary | ICD-10-CM | POA: Diagnosis not present

## 2018-08-01 DIAGNOSIS — Z6831 Body mass index (BMI) 31.0-31.9, adult: Secondary | ICD-10-CM | POA: Diagnosis not present

## 2018-08-01 DIAGNOSIS — C9001 Multiple myeloma in remission: Secondary | ICD-10-CM | POA: Diagnosis not present

## 2018-08-01 DIAGNOSIS — Z7682 Awaiting organ transplant status: Secondary | ICD-10-CM | POA: Diagnosis not present

## 2018-08-06 DIAGNOSIS — C9000 Multiple myeloma not having achieved remission: Secondary | ICD-10-CM | POA: Diagnosis not present

## 2018-08-06 DIAGNOSIS — Z6830 Body mass index (BMI) 30.0-30.9, adult: Secondary | ICD-10-CM | POA: Diagnosis not present

## 2018-08-06 DIAGNOSIS — H6690 Otitis media, unspecified, unspecified ear: Secondary | ICD-10-CM | POA: Diagnosis not present

## 2018-08-07 DIAGNOSIS — Z52011 Autologous donor, stem cells: Secondary | ICD-10-CM | POA: Diagnosis not present

## 2018-08-07 DIAGNOSIS — C9001 Multiple myeloma in remission: Secondary | ICD-10-CM | POA: Diagnosis not present

## 2018-08-07 DIAGNOSIS — C9000 Multiple myeloma not having achieved remission: Secondary | ICD-10-CM | POA: Diagnosis not present

## 2018-08-07 DIAGNOSIS — Z452 Encounter for adjustment and management of vascular access device: Secondary | ICD-10-CM | POA: Diagnosis not present

## 2018-08-07 DIAGNOSIS — Z7682 Awaiting organ transplant status: Secondary | ICD-10-CM | POA: Diagnosis not present

## 2018-08-07 DIAGNOSIS — Z6831 Body mass index (BMI) 31.0-31.9, adult: Secondary | ICD-10-CM | POA: Diagnosis not present

## 2018-08-08 ENCOUNTER — Other Ambulatory Visit: Payer: Self-pay | Admitting: *Deleted

## 2018-08-08 DIAGNOSIS — C9000 Multiple myeloma not having achieved remission: Secondary | ICD-10-CM

## 2018-08-08 MED ORDER — LENALIDOMIDE 10 MG PO CAPS: ORAL_CAPSULE | ORAL | 0 refills | Status: DC

## 2018-08-10 ENCOUNTER — Other Ambulatory Visit: Payer: Self-pay | Admitting: Oncology

## 2018-08-10 DIAGNOSIS — C9000 Multiple myeloma not having achieved remission: Secondary | ICD-10-CM

## 2018-08-11 DIAGNOSIS — D61818 Other pancytopenia: Secondary | ICD-10-CM | POA: Diagnosis not present

## 2018-08-11 DIAGNOSIS — C9000 Multiple myeloma not having achieved remission: Secondary | ICD-10-CM | POA: Diagnosis not present

## 2018-08-12 ENCOUNTER — Telehealth: Payer: Self-pay | Admitting: Oncology

## 2018-08-12 DIAGNOSIS — C9000 Multiple myeloma not having achieved remission: Secondary | ICD-10-CM | POA: Diagnosis not present

## 2018-08-12 NOTE — Telephone Encounter (Signed)
Spoke with patient regarding appt for 10/15 per 10/7 staff message from [facility]

## 2018-08-13 ENCOUNTER — Ambulatory Visit: Payer: BLUE CROSS/BLUE SHIELD | Admitting: Nurse Practitioner

## 2018-08-13 ENCOUNTER — Ambulatory Visit: Payer: BLUE CROSS/BLUE SHIELD

## 2018-08-13 ENCOUNTER — Other Ambulatory Visit: Payer: BLUE CROSS/BLUE SHIELD

## 2018-08-20 ENCOUNTER — Other Ambulatory Visit: Payer: BLUE CROSS/BLUE SHIELD

## 2018-08-20 ENCOUNTER — Inpatient Hospital Stay: Payer: BLUE CROSS/BLUE SHIELD

## 2018-08-20 ENCOUNTER — Telehealth: Payer: Self-pay

## 2018-08-20 ENCOUNTER — Ambulatory Visit: Payer: BLUE CROSS/BLUE SHIELD

## 2018-08-20 ENCOUNTER — Ambulatory Visit: Payer: BLUE CROSS/BLUE SHIELD | Admitting: Oncology

## 2018-08-20 ENCOUNTER — Inpatient Hospital Stay: Payer: BLUE CROSS/BLUE SHIELD | Attending: Oncology | Admitting: Oncology

## 2018-08-20 VITALS — BP 145/97 | HR 94 | Temp 97.7°F | Resp 18 | Ht 66.0 in | Wt 192.6 lb

## 2018-08-20 DIAGNOSIS — M7918 Myalgia, other site: Secondary | ICD-10-CM | POA: Diagnosis not present

## 2018-08-20 DIAGNOSIS — C9000 Multiple myeloma not having achieved remission: Secondary | ICD-10-CM

## 2018-08-20 DIAGNOSIS — Z5111 Encounter for antineoplastic chemotherapy: Secondary | ICD-10-CM | POA: Diagnosis not present

## 2018-08-20 DIAGNOSIS — G8918 Other acute postprocedural pain: Secondary | ICD-10-CM | POA: Diagnosis not present

## 2018-08-20 DIAGNOSIS — D696 Thrombocytopenia, unspecified: Secondary | ICD-10-CM | POA: Diagnosis not present

## 2018-08-20 DIAGNOSIS — D649 Anemia, unspecified: Secondary | ICD-10-CM | POA: Diagnosis not present

## 2018-08-20 LAB — CMP (CANCER CENTER ONLY)
ALBUMIN: 4.1 g/dL (ref 3.5–5.0)
ALT: 20 U/L (ref 0–44)
AST: 20 U/L (ref 15–41)
Alkaline Phosphatase: 63 U/L (ref 38–126)
Anion gap: 11 (ref 5–15)
BUN: 15 mg/dL (ref 6–20)
CALCIUM: 10 mg/dL (ref 8.9–10.3)
CHLORIDE: 106 mmol/L (ref 98–111)
CO2: 24 mmol/L (ref 22–32)
CREATININE: 1.25 mg/dL — AB (ref 0.61–1.24)
GFR, Estimated: >60 mL/min (ref >60)
GLUCOSE: 98 mg/dL (ref 70–99)
Potassium: 4 mmol/L (ref 3.5–5.1)
SODIUM: 141 mmol/L (ref 135–145)
Total Bilirubin: 0.7 mg/dL (ref 0.3–1.2)
Total Protein: 6.8 g/dL (ref 6.5–8.1)

## 2018-08-20 LAB — CBC WITH DIFFERENTIAL (CANCER CENTER ONLY)
Abs Immature Granulocytes: 0.03 10*3/uL (ref 0.00–0.07)
BASOS ABS: 0 10*3/uL (ref 0.0–0.1)
BASOS PCT: 1 %
Eosinophils Absolute: 0.1 10*3/uL (ref 0.0–0.5)
Eosinophils Relative: 1 %
HCT: 37.1 % — ABNORMAL LOW (ref 39.0–52.0)
HEMOGLOBIN: 12.6 g/dL — AB (ref 13.0–17.0)
IMMATURE GRANULOCYTES: 1 %
Lymphocytes Relative: 16 %
Lymphs Abs: 0.8 10*3/uL (ref 0.7–4.0)
MCH: 32.8 pg (ref 26.0–34.0)
MCHC: 34 g/dL (ref 30.0–36.0)
MCV: 96.6 fL (ref 80.0–100.0)
Monocytes Absolute: 0.5 10*3/uL (ref 0.1–1.0)
Monocytes Relative: 9 %
NEUTROS ABS: 3.7 10*3/uL (ref 1.7–7.7)
NEUTROS PCT: 72 %
NRBC: 0 % (ref 0.0–0.2)
PLATELETS: 142 10*3/uL — AB (ref 150–400)
RBC: 3.84 MIL/uL — AB (ref 4.22–5.81)
RDW: 14 % (ref 11.5–15.5)
WBC: 5.1 10*3/uL (ref 4.0–10.5)

## 2018-08-20 MED ORDER — DEXAMETHASONE 4 MG PO TABS
ORAL_TABLET | ORAL | Status: AC
  Filled 2018-08-20: qty 10

## 2018-08-20 MED ORDER — DEXAMETHASONE 4 MG PO TABS
40.0000 mg | ORAL_TABLET | ORAL | Status: DC
  Administered 2018-08-20: 40 mg via ORAL

## 2018-08-20 MED ORDER — BORTEZOMIB CHEMO SQ INJECTION 3.5 MG (2.5MG/ML)
1.3000 mg/m2 | Freq: Once | INTRAMUSCULAR | Status: AC
  Administered 2018-08-20: 2.5 mg via SUBCUTANEOUS
  Filled 2018-08-20: qty 1

## 2018-08-20 MED ORDER — PROCHLORPERAZINE MALEATE 10 MG PO TABS
10.0000 mg | ORAL_TABLET | Freq: Once | ORAL | Status: AC
  Administered 2018-08-20: 10 mg via ORAL

## 2018-08-20 MED ORDER — PROCHLORPERAZINE MALEATE 10 MG PO TABS
ORAL_TABLET | ORAL | Status: AC
  Filled 2018-08-20: qty 1

## 2018-08-20 NOTE — Patient Instructions (Signed)
[facility] Discharge Instructions for Patients Receiving Chemotherapy  Today you received the following chemotherapy agents Velcade.  To help prevent nausea and vomiting after your treatment, we encourage you to take your nausea medication as directed.  If you develop nausea and vomiting that is not controlled by your nausea medication, call the clinic.   BELOW ARE SYMPTOMS THAT SHOULD BE REPORTED IMMEDIATELY:  *FEVER GREATER THAN 100.5 F  *CHILLS WITH OR WITHOUT FEVER  NAUSEA AND VOMITING THAT IS NOT CONTROLLED WITH YOUR NAUSEA MEDICATION  *UNUSUAL SHORTNESS OF BREATH  *UNUSUAL BRUISING OR BLEEDING  TENDERNESS IN MOUTH AND THROAT WITH OR WITHOUT PRESENCE OF ULCERS  *URINARY PROBLEMS  *BOWEL PROBLEMS  UNUSUAL RASH Items with * indicate a potential emergency and should be followed up as soon as possible.  Feel free to call the clinic should you have any questions or concerns. The clinic phone number is [phone].  Please show the CHEMO ALERT CARD at check-in to the Emergency Department and triage nurse.   

## 2018-08-20 NOTE — Progress Notes (Signed)
[place] OFFICE PROGRESS NOTE   Diagnosis: Multiple myeloma  INTERVAL HISTORY:   Paul Green returns as scheduled.  He underwent autologous stem cell harvest on 08/12/2018.  He was treated with G-CSF and Plerixafor mobilization.  He reports an adequate number of stem cells were harvested. He has experienced discomfort in the left hip and leg since undergoing the G-CSF therapy.  He also has pain at the right retroauricular area and upper right neck.  He was treated with a course of Augmentin by his primary physician without improvement.  No fever.  No hearing change. He reports the left hip and leg discomfort improved with ambulation.  He is walking 5 miles a day.  Objective:  Vital signs in last 24 hours:  Blood pressure (!) 145/97, pulse 94, temperature 97.7 F (36.5 C), temperature source Oral, resp. rate 18, height '5\' 6"'  (1.676 m), weight 192 lb 9.6 oz (87.4 kg), SpO2 100 %.    HEENT: The right external canal and tympanic membrane are clear, neck without mass or tenderness, no skull tenderness Resp: Lungs clear bilaterally Cardio: Giller rate and rhythm GI: No hepatosplenomegaly, nontender Vascular: No leg edema Musculoskeletal: Mild tenderness at the left trochanter, mild discomfort with motion at the left hip  Portacath/PICC-without erythema  Lab Results:  Lab Results  Component Value Date   WBC 5.1 08/20/2018   HGB 12.6 (L) 08/20/2018   HCT 37.1 (L) 08/20/2018   MCV 96.6 08/20/2018   PLT 142 (L) 08/20/2018   NEUTROABS 3.7 08/20/2018    CMP  Lab Results  Component Value Date   NA 142 07/16/2018   K 4.0 07/16/2018   CL 107 07/16/2018   CO2 25 07/16/2018   GLUCOSE 93 07/16/2018   BUN 17 07/16/2018   CREATININE 1.31 (H) 07/16/2018   CALCIUM 10.1 07/16/2018   PROT 6.6 07/16/2018   ALBUMIN 4.5 07/16/2018   AST 23 07/16/2018   ALT 28 07/16/2018   ALKPHOS 37 (L) 07/16/2018   BILITOT 0.9 07/16/2018   GFRNONAA 59 (L) 07/16/2018   GFRAA >60  07/16/2018    Medications: I have reviewed the patient's current medications.   Assessment/Plan: 1. Multiple myeloma   Anemia/thrombocytopenia  Bone marrow biopsy 01/25/2018-hypercellular bone marrow with extensive involvement by plasma cell neoplasm; flow cytometry with a population of cells with plasmacytic differentiation (13%), no monoclonal B-cell population identified, T cells with nonspecific changes.  Cytogenetics-multiple rearrangements, FISH panel negative for cytogenetic abnormalities to be associated with multiple myeloma including loss of p53  01/24/2018 SPEP-noM spike,elevated serum lambda free light chains (432)  Dexamethasone 40 mg daily times 4 days beginning 01/25/2018  Velcade/dexamethasone weekly x3 followed by a 1 week break beginning3/26/2019(weekly dexamethasone initiated 02/05/2018, Revlimid initiated 02/09/2018)  Lambda light chains improved 02/26/2018  Cycle 2 RVD 03/05/2018 (Velcade dose reduced due to neutropenia), day 8 Velcade held and Revlimid discontinued secondary to recurrent severe neutropenia;day 15 Revlimid resumed x1 week, Velcade held;  Revlimid and Velcade placed on hold 04/02/2018 due to persistent neutropenia.  Bone marrow biopsy at [facility] 04/18/2018-hypercellular marrow, no evidence of myeloma  Cycle 3 RVD started 04/23/2018, Revlimid 5 mg dailyfor 14 days beginning 05/01/2018  Cycle 4 RVD 05/22/2018,Revlimid 5 mg daily for 14 days beginning 05/29/2018  Cycle 5 RVD 06/18/2018, Velcade escalated to standard dosing, Revlimid increased to 10 mg  Cycle 6 RVD 07/16/2018 but discontinued after day 1 Velcade  Stem cell harvest at [facility] 08/12/2018  Cycle 7 RVD 08/20/2018 2. Hypercalcemia status post pamidronate 01/24/2018, resolved  3. Diffuse abnormal bone marrow signal, compression fracture of L1 4. Back painsecondary to multiple myeloma, compression fracture.Improved. 5. Anorexia/weight loss-improved 6. 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 Severe neutropenia-recurrent after Revlimid resumed 03/19/2018, resolved     Disposition: Mr. Clerk appears well.  He underwent stem cell harvest last week at [facility].  He developed left hip and leg discomfort following G-CSF therapy.  Hopefully this pain will improve over the next several days.  The discomfort at the right upper neck and behind the right ear is likely related to benign musculoskeletal pain.  He will contact us if the pain does not improve.  The plan is to resume treatment with RVD today.  He will return for Velcade/Decadron on 08/27/2018 and 09/03/2018.  He will be scheduled for an office and lab visit on 09/17/2018.  He will receive Zometa on 09/17/2018.  Betsy Coder, MD  08/20/2018  8:54 AM

## 2018-08-20 NOTE — Telephone Encounter (Signed)
Left a voice mail concerning upcoming appointment. Per 10/14 los

## 2018-08-20 NOTE — Telephone Encounter (Signed)
Printed avs and calender of upcoming appointment. Per 10/15 los 

## 2018-08-21 LAB — KAPPA/LAMBDA LIGHT CHAINS
KAPPA FREE LGHT CHN: 6.7 mg/L (ref 3.3–19.4)
KAPPA, LAMDA LIGHT CHAIN RATIO: 0.15 — AB (ref 0.26–1.65)
Lambda free light chains: 44.2 mg/L — ABNORMAL HIGH (ref 5.7–26.3)

## 2018-08-24 ENCOUNTER — Other Ambulatory Visit: Payer: Self-pay | Admitting: Oncology

## 2018-08-27 ENCOUNTER — Ambulatory Visit: Payer: BLUE CROSS/BLUE SHIELD

## 2018-08-27 ENCOUNTER — Other Ambulatory Visit: Payer: BLUE CROSS/BLUE SHIELD

## 2018-08-27 DIAGNOSIS — L821 Other seborrheic keratosis: Secondary | ICD-10-CM | POA: Diagnosis not present

## 2018-08-27 DIAGNOSIS — L820 Inflamed seborrheic keratosis: Secondary | ICD-10-CM | POA: Diagnosis not present

## 2018-08-27 DIAGNOSIS — D2272 Melanocytic nevi of left lower limb, including hip: Secondary | ICD-10-CM | POA: Diagnosis not present

## 2018-08-27 DIAGNOSIS — D2271 Melanocytic nevi of right lower limb, including hip: Secondary | ICD-10-CM | POA: Diagnosis not present

## 2018-08-27 DIAGNOSIS — D225 Melanocytic nevi of trunk: Secondary | ICD-10-CM | POA: Diagnosis not present

## 2018-08-28 ENCOUNTER — Inpatient Hospital Stay: Payer: BLUE CROSS/BLUE SHIELD

## 2018-08-28 VITALS — BP 112/94 | HR 98 | Temp 97.8°F | Resp 16

## 2018-08-28 DIAGNOSIS — C9000 Multiple myeloma not having achieved remission: Secondary | ICD-10-CM

## 2018-08-28 DIAGNOSIS — D696 Thrombocytopenia, unspecified: Secondary | ICD-10-CM | POA: Diagnosis not present

## 2018-08-28 DIAGNOSIS — Z5111 Encounter for antineoplastic chemotherapy: Secondary | ICD-10-CM | POA: Diagnosis not present

## 2018-08-28 DIAGNOSIS — G8918 Other acute postprocedural pain: Secondary | ICD-10-CM | POA: Diagnosis not present

## 2018-08-28 DIAGNOSIS — M7918 Myalgia, other site: Secondary | ICD-10-CM | POA: Diagnosis not present

## 2018-08-28 DIAGNOSIS — D649 Anemia, unspecified: Secondary | ICD-10-CM | POA: Diagnosis not present

## 2018-08-28 LAB — CBC WITH DIFFERENTIAL (CANCER CENTER ONLY)
ABS IMMATURE GRANULOCYTES: 0.01 10*3/uL (ref 0.00–0.07)
BASOS ABS: 0 10*3/uL (ref 0.0–0.1)
BASOS PCT: 1 %
EOS ABS: 0.2 10*3/uL (ref 0.0–0.5)
Eosinophils Relative: 6 %
HCT: 35.9 % — ABNORMAL LOW (ref 39.0–52.0)
Hemoglobin: 12.6 g/dL — ABNORMAL LOW (ref 13.0–17.0)
IMMATURE GRANULOCYTES: 0 %
Lymphocytes Relative: 18 %
Lymphs Abs: 0.6 10*3/uL — ABNORMAL LOW (ref 0.7–4.0)
MCH: 33.3 pg (ref 26.0–34.0)
MCHC: 35.1 g/dL (ref 30.0–36.0)
MCV: 95 fL (ref 80.0–100.0)
Monocytes Absolute: 0.5 10*3/uL (ref 0.1–1.0)
Monocytes Relative: 14 %
NEUTROS ABS: 2 10*3/uL (ref 1.7–7.7)
NEUTROS PCT: 61 %
NRBC: 0 % (ref 0.0–0.2)
PLATELETS: 181 10*3/uL (ref 150–400)
RBC: 3.78 MIL/uL — ABNORMAL LOW (ref 4.22–5.81)
RDW: 14.3 % (ref 11.5–15.5)
WBC Count: 3.2 10*3/uL — ABNORMAL LOW (ref 4.0–10.5)

## 2018-08-28 MED ORDER — BORTEZOMIB CHEMO SQ INJECTION 3.5 MG (2.5MG/ML)
1.3000 mg/m2 | Freq: Once | INTRAMUSCULAR | Status: AC
  Administered 2018-08-28: 2.5 mg via SUBCUTANEOUS
  Filled 2018-08-28: qty 1

## 2018-08-28 MED ORDER — PROCHLORPERAZINE MALEATE 10 MG PO TABS
ORAL_TABLET | ORAL | Status: AC
  Filled 2018-08-28: qty 1

## 2018-08-28 MED ORDER — DEXAMETHASONE 4 MG PO TABS
ORAL_TABLET | ORAL | Status: AC
  Filled 2018-08-28: qty 10

## 2018-08-28 MED ORDER — PROCHLORPERAZINE MALEATE 10 MG PO TABS
10.0000 mg | ORAL_TABLET | Freq: Once | ORAL | Status: AC
  Administered 2018-08-28: 10 mg via ORAL

## 2018-08-28 MED ORDER — DEXAMETHASONE 4 MG PO TABS
40.0000 mg | ORAL_TABLET | ORAL | Status: DC
  Administered 2018-08-28: 40 mg via ORAL

## 2018-08-28 NOTE — Patient Instructions (Signed)
[facility] Discharge Instructions for Patients Receiving Chemotherapy  Today you received the following chemotherapy agents Velcade.  To help prevent nausea and vomiting after your treatment, we encourage you to take your nausea medication as directed.  If you develop nausea and vomiting that is not controlled by your nausea medication, call the clinic.   BELOW ARE SYMPTOMS THAT SHOULD BE REPORTED IMMEDIATELY:  *FEVER GREATER THAN 100.5 F  *CHILLS WITH OR WITHOUT FEVER  NAUSEA AND VOMITING THAT IS NOT CONTROLLED WITH YOUR NAUSEA MEDICATION  *UNUSUAL SHORTNESS OF BREATH  *UNUSUAL BRUISING OR BLEEDING  TENDERNESS IN MOUTH AND THROAT WITH OR WITHOUT PRESENCE OF ULCERS  *URINARY PROBLEMS  *BOWEL PROBLEMS  UNUSUAL RASH Items with * indicate a potential emergency and should be followed up as soon as possible.  Feel free to call the clinic should you have any questions or concerns. The clinic phone number is [phone].  Please show the CHEMO ALERT CARD at check-in to the Emergency Department and triage nurse.   

## 2018-08-28 NOTE — Progress Notes (Signed)
Ok to treat without CMP today per Dr. Benay Spice.

## 2018-09-01 ENCOUNTER — Other Ambulatory Visit: Payer: Self-pay | Admitting: Oncology

## 2018-09-02 ENCOUNTER — Telehealth: Payer: Self-pay | Admitting: Cardiology

## 2018-09-02 NOTE — Telephone Encounter (Signed)
New message    Patient wants you to delete the  hydrochlorothiazide (HYDRODIURIL) 25 MG tablet(Expired) Take 1 tablet (25 mg total) by mouth daily.    from [place] , pharmacy keeps calling them and telling them that they have filled it.

## 2018-09-02 NOTE — Telephone Encounter (Signed)
LEFT MESSAGE TO CALL BACK-  NO RECORD OF MEDICATION - HYDROCHLOROTHIAZIDE 25 MG -- BEING STOPPED

## 2018-09-02 NOTE — Telephone Encounter (Signed)
Spoke with wife. She states when patient received information on 07/26/18 to start  AMLODIPINE 5 mg after stem cell therapy,  Patient started the medication ,but was under the impression to stop the HCTZ and potassium .  wife states blood pressure reading have been good since starting amlodipine, without hctz and potassium .   wife is aware will inform Dr Harrell Gave- and remove medication from medication list if okay'd by doctor

## 2018-09-02 NOTE — Telephone Encounter (Signed)
If blood pressure is well controlled on the amlodipine, I am ok with stopping the HCTZ and potassium. Thank you for the update.

## 2018-09-03 ENCOUNTER — Other Ambulatory Visit: Payer: BLUE CROSS/BLUE SHIELD

## 2018-09-03 ENCOUNTER — Inpatient Hospital Stay: Payer: BLUE CROSS/BLUE SHIELD

## 2018-09-03 ENCOUNTER — Ambulatory Visit: Payer: BLUE CROSS/BLUE SHIELD

## 2018-09-03 VITALS — BP 127/94 | HR 87 | Temp 97.8°F | Resp 16

## 2018-09-03 DIAGNOSIS — D649 Anemia, unspecified: Secondary | ICD-10-CM | POA: Diagnosis not present

## 2018-09-03 DIAGNOSIS — Z5111 Encounter for antineoplastic chemotherapy: Secondary | ICD-10-CM | POA: Diagnosis not present

## 2018-09-03 DIAGNOSIS — D696 Thrombocytopenia, unspecified: Secondary | ICD-10-CM | POA: Diagnosis not present

## 2018-09-03 DIAGNOSIS — G8918 Other acute postprocedural pain: Secondary | ICD-10-CM | POA: Diagnosis not present

## 2018-09-03 DIAGNOSIS — C9000 Multiple myeloma not having achieved remission: Secondary | ICD-10-CM

## 2018-09-03 DIAGNOSIS — M7918 Myalgia, other site: Secondary | ICD-10-CM | POA: Diagnosis not present

## 2018-09-03 LAB — COMPREHENSIVE METABOLIC PANEL
ALBUMIN: 4 g/dL (ref 3.5–5.0)
ALK PHOS: 49 U/L (ref 38–126)
ALT: 31 U/L (ref 0–44)
ANION GAP: 9 (ref 5–15)
AST: 22 U/L (ref 15–41)
BILIRUBIN TOTAL: 0.7 mg/dL (ref 0.3–1.2)
BUN: 15 mg/dL (ref 6–20)
CALCIUM: 10.2 mg/dL (ref 8.9–10.3)
CO2: 27 mmol/L (ref 22–32)
Chloride: 107 mmol/L (ref 98–111)
Creatinine, Ser: 1.36 mg/dL — ABNORMAL HIGH (ref 0.61–1.24)
GFR calc Af Amer: >60 mL/min (ref >60)
GFR, EST NON AFRICAN AMERICAN: 56 mL/min — AB (ref >60)
GLUCOSE: 75 mg/dL (ref 70–99)
POTASSIUM: 4 mmol/L (ref 3.5–5.1)
Sodium: 143 mmol/L (ref 135–145)
TOTAL PROTEIN: 6.4 g/dL — AB (ref 6.5–8.1)

## 2018-09-03 LAB — CBC WITH DIFFERENTIAL (CANCER CENTER ONLY)
Abs Immature Granulocytes: 0.02 10*3/uL (ref 0.00–0.07)
BASOS ABS: 0 10*3/uL (ref 0.0–0.1)
Basophils Relative: 1 %
EOS PCT: 6 %
Eosinophils Absolute: 0.2 10*3/uL (ref 0.0–0.5)
HEMATOCRIT: 39.6 % (ref 39.0–52.0)
HEMOGLOBIN: 13.4 g/dL (ref 13.0–17.0)
Immature Granulocytes: 1 %
LYMPHS ABS: 0.6 10*3/uL — AB (ref 0.7–4.0)
Lymphocytes Relative: 18 %
MCH: 33.3 pg (ref 26.0–34.0)
MCHC: 33.8 g/dL (ref 30.0–36.0)
MCV: 98.3 fL (ref 80.0–100.0)
MONO ABS: 0.4 10*3/uL (ref 0.1–1.0)
MONOS PCT: 14 %
Neutro Abs: 2 10*3/uL (ref 1.7–7.7)
Neutrophils Relative %: 60 %
Platelet Count: 165 10*3/uL (ref 150–400)
RBC: 4.03 MIL/uL — ABNORMAL LOW (ref 4.22–5.81)
RDW: 14.6 % (ref 11.5–15.5)
WBC Count: 3.2 10*3/uL — ABNORMAL LOW (ref 4.0–10.5)
nRBC: 0 % (ref 0.0–0.2)

## 2018-09-03 MED ORDER — DEXAMETHASONE 4 MG PO TABS
40.0000 mg | ORAL_TABLET | ORAL | Status: DC
  Administered 2018-09-03: 40 mg via ORAL

## 2018-09-03 MED ORDER — BORTEZOMIB CHEMO SQ INJECTION 3.5 MG (2.5MG/ML)
1.3000 mg/m2 | Freq: Once | INTRAMUSCULAR | Status: AC
  Administered 2018-09-03: 2.5 mg via SUBCUTANEOUS
  Filled 2018-09-03: qty 1

## 2018-09-03 MED ORDER — DEXAMETHASONE 4 MG PO TABS
ORAL_TABLET | ORAL | Status: AC
  Filled 2018-09-03: qty 10

## 2018-09-03 MED ORDER — PROCHLORPERAZINE MALEATE 10 MG PO TABS
ORAL_TABLET | ORAL | Status: AC
  Filled 2018-09-03: qty 1

## 2018-09-03 MED ORDER — PROCHLORPERAZINE MALEATE 10 MG PO TABS
10.0000 mg | ORAL_TABLET | Freq: Once | ORAL | Status: AC
  Administered 2018-09-03: 10 mg via ORAL

## 2018-09-03 NOTE — Telephone Encounter (Signed)
Called patient wife, advised of note from MD, wife verbalized understanding. Requested that medications be taken off. I did advised patient to contact us if the BP began to increase.  No questions or concerns at this time.

## 2018-09-03 NOTE — Progress Notes (Signed)
Per Dr. Benay Spice, ok to treat without CMP results. Also ok to treat with elevated BP.

## 2018-09-10 ENCOUNTER — Other Ambulatory Visit: Payer: Self-pay | Admitting: *Deleted

## 2018-09-10 DIAGNOSIS — C9000 Multiple myeloma not having achieved remission: Secondary | ICD-10-CM

## 2018-09-10 MED ORDER — LENALIDOMIDE 10 MG PO CAPS: ORAL_CAPSULE | ORAL | 0 refills | Status: DC

## 2018-09-10 NOTE — Telephone Encounter (Signed)
Patient had called to follow up on his Revlimid refill (has been off medication for over 7 days). Called patient and informed that refill completed today and he will need to do his patient survey. He will do so now.

## 2018-09-14 ENCOUNTER — Other Ambulatory Visit: Payer: Self-pay | Admitting: Oncology

## 2018-09-16 ENCOUNTER — Inpatient Hospital Stay: Payer: BLUE CROSS/BLUE SHIELD

## 2018-09-16 ENCOUNTER — Telehealth: Payer: Self-pay | Admitting: Oncology

## 2018-09-16 ENCOUNTER — Other Ambulatory Visit: Payer: Self-pay | Admitting: *Deleted

## 2018-09-16 ENCOUNTER — Ambulatory Visit
Admission: RE | Admit: 2018-09-16 | Discharge: 2018-09-16 | Disposition: A | Discharge status: Home / Self Care | Payer: Self-pay | Source: Ambulatory Visit | Attending: Oncology | Admitting: Oncology

## 2018-09-16 ENCOUNTER — Inpatient Hospital Stay (HOSPITAL_BASED_OUTPATIENT_CLINIC_OR_DEPARTMENT_OTHER): Payer: BLUE CROSS/BLUE SHIELD | Admitting: Oncology

## 2018-09-16 ENCOUNTER — Inpatient Hospital Stay: Payer: BLUE CROSS/BLUE SHIELD | Attending: Oncology

## 2018-09-16 VITALS — BP 139/104 | HR 103 | Temp 97.8°F | Resp 18 | Ht 66.0 in | Wt 193.4 lb

## 2018-09-16 DIAGNOSIS — C9000 Multiple myeloma not having achieved remission: Secondary | ICD-10-CM

## 2018-09-16 DIAGNOSIS — Z79899 Other long term (current) drug therapy: Secondary | ICD-10-CM | POA: Diagnosis not present

## 2018-09-16 DIAGNOSIS — M545 Low back pain: Secondary | ICD-10-CM

## 2018-09-16 DIAGNOSIS — G893 Neoplasm related pain (acute) (chronic): Secondary | ICD-10-CM | POA: Diagnosis not present

## 2018-09-16 DIAGNOSIS — D696 Thrombocytopenia, unspecified: Secondary | ICD-10-CM | POA: Insufficient documentation

## 2018-09-16 DIAGNOSIS — D649 Anemia, unspecified: Secondary | ICD-10-CM | POA: Insufficient documentation

## 2018-09-16 DIAGNOSIS — R630 Anorexia: Secondary | ICD-10-CM

## 2018-09-16 DIAGNOSIS — Z5111 Encounter for antineoplastic chemotherapy: Secondary | ICD-10-CM | POA: Diagnosis not present

## 2018-09-16 LAB — CBC WITH DIFFERENTIAL (CANCER CENTER ONLY)
ABS IMMATURE GRANULOCYTES: 0.01 10*3/uL (ref 0.00–0.07)
BASOS PCT: 2 %
Basophils Absolute: 0.1 10*3/uL (ref 0.0–0.1)
Eosinophils Absolute: 0.1 10*3/uL (ref 0.0–0.5)
Eosinophils Relative: 2 %
HCT: 40.5 % (ref 39.0–52.0)
Hemoglobin: 14 g/dL (ref 13.0–17.0)
Immature Granulocytes: 0 %
Lymphocytes Relative: 13 %
Lymphs Abs: 0.6 10*3/uL — ABNORMAL LOW (ref 0.7–4.0)
MCH: 33.3 pg (ref 26.0–34.0)
MCHC: 34.6 g/dL (ref 30.0–36.0)
MCV: 96.2 fL (ref 80.0–100.0)
MONO ABS: 0.4 10*3/uL (ref 0.1–1.0)
Monocytes Relative: 8 %
NEUTROS ABS: 3.5 10*3/uL (ref 1.7–7.7)
Neutrophils Relative %: 75 %
PLATELETS: 184 10*3/uL (ref 150–400)
RBC: 4.21 MIL/uL — AB (ref 4.22–5.81)
RDW: 14.4 % (ref 11.5–15.5)
WBC Count: 4.7 10*3/uL (ref 4.0–10.5)
nRBC: 0 % (ref 0.0–0.2)

## 2018-09-16 LAB — CMP (CANCER CENTER ONLY)
ALBUMIN: 4 g/dL (ref 3.5–5.0)
ALK PHOS: 48 U/L (ref 38–126)
ALT: 21 U/L (ref 0–44)
AST: 17 U/L (ref 15–41)
Anion gap: 12 (ref 5–15)
BUN: 16 mg/dL (ref 6–20)
CHLORIDE: 108 mmol/L (ref 98–111)
CO2: 23 mmol/L (ref 22–32)
Calcium: 10.5 mg/dL — ABNORMAL HIGH (ref 8.9–10.3)
Creatinine: 1.43 mg/dL — ABNORMAL HIGH (ref 0.61–1.24)
GFR, Estimated: 53 mL/min — ABNORMAL LOW (ref >60)
Glucose, Bld: 108 mg/dL — ABNORMAL HIGH (ref 70–99)
Potassium: 3.7 mmol/L (ref 3.5–5.1)
SODIUM: 143 mmol/L (ref 135–145)
TOTAL PROTEIN: 7.1 g/dL (ref 6.5–8.1)
Total Bilirubin: 0.5 mg/dL (ref 0.3–1.2)

## 2018-09-16 MED ORDER — BORTEZOMIB CHEMO SQ INJECTION 3.5 MG (2.5MG/ML)
1.3000 mg/m2 | Freq: Once | INTRAMUSCULAR | Status: AC
  Administered 2018-09-16: 2.5 mg via SUBCUTANEOUS
  Filled 2018-09-16: qty 1

## 2018-09-16 MED ORDER — ZOLEDRONIC ACID 4 MG/100ML IV SOLN
4.0000 mg | Freq: Once | INTRAVENOUS | Status: AC
  Administered 2018-09-16: 4 mg via INTRAVENOUS
  Filled 2018-09-16: qty 100

## 2018-09-16 MED ORDER — DEXAMETHASONE 4 MG PO TABS
ORAL_TABLET | ORAL | Status: AC
  Filled 2018-09-16: qty 10

## 2018-09-16 MED ORDER — SODIUM CHLORIDE 0.9 % IV SOLN
Freq: Once | INTRAVENOUS | Status: AC
  Administered 2018-09-16: 10:00:00 via INTRAVENOUS
  Filled 2018-09-16: qty 250

## 2018-09-16 MED ORDER — PROCHLORPERAZINE MALEATE 10 MG PO TABS
ORAL_TABLET | ORAL | Status: AC
  Filled 2018-09-16: qty 1

## 2018-09-16 MED ORDER — DEXAMETHASONE 4 MG PO TABS
40.0000 mg | ORAL_TABLET | ORAL | Status: DC
  Administered 2018-09-16: 40 mg via ORAL

## 2018-09-16 MED ORDER — PROCHLORPERAZINE MALEATE 10 MG PO TABS
10.0000 mg | ORAL_TABLET | Freq: Once | ORAL | Status: AC
  Administered 2018-09-16: 10 mg via ORAL

## 2018-09-16 NOTE — Addendum Note (Signed)
Addended by: Betsy Coder B on: 09/16/2018 10:00 AM   Modules accepted: Orders

## 2018-09-16 NOTE — Telephone Encounter (Signed)
Scheduled appt per 11/11 los - gave patient  Calender

## 2018-09-16 NOTE — Patient Instructions (Signed)
[facility] Discharge Instructions for Patients Receiving Chemotherapy  Today you received the following chemotherapy agents Velcade.  To help prevent nausea and vomiting after your treatment, we encourage you to take your nausea medication as directed.  If you develop nausea and vomiting that is not controlled by your nausea medication, call the clinic.   BELOW ARE SYMPTOMS THAT SHOULD BE REPORTED IMMEDIATELY:  *FEVER GREATER THAN 100.5 F  *CHILLS WITH OR WITHOUT FEVER  NAUSEA AND VOMITING THAT IS NOT CONTROLLED WITH YOUR NAUSEA MEDICATION  *UNUSUAL SHORTNESS OF BREATH  *UNUSUAL BRUISING OR BLEEDING  TENDERNESS IN MOUTH AND THROAT WITH OR WITHOUT PRESENCE OF ULCERS  *URINARY PROBLEMS  *BOWEL PROBLEMS  UNUSUAL RASH Items with * indicate a potential emergency and should be followed up as soon as possible.  Feel free to call the clinic should you have any questions or concerns. The clinic phone number is [phone].  Please show the CHEMO ALERT CARD at check-in to the Emergency Department and triage nurse.   

## 2018-09-16 NOTE — Progress Notes (Signed)
[name]   Diagnosis: Multiple myeloma  INTERVAL HISTORY:   [name] returns as scheduled.  He completed cycle 7 RVD beginning 08/20/2018.  He feels well.  No mouth sores, diarrhea, or neuropathy symptoms.  He reports discomfort at the left shoulder.  He relates this to painting.  Pain in other sites has resolved.  Objective:  Vital signs in last 24 hours:  Blood pressure (!) 139/104, pulse (!) 103, temperature 97.8 F (36.6 C), temperature source Oral, resp. rate 18, height _0  (1.676 m), weight 193 lb 6.4 oz (87.7 kg), SpO2 99 %.    HEENT: No thrush or ulcers Resp: Lungs clear bilaterally Cardio: Regular rate and rhythm GI: Nontender, no hepatosplenomegaly Vascular: No leg edema Musculoskeletal: Discomfort with motion at the left shoulder    Lab Results:  Lab Results  Component Value Date   WBC 4.7 09/16/2018   HGB 14.0 09/16/2018   HCT 40.5 09/16/2018   MCV 96.2 09/16/2018   PLT 184 09/16/2018   NEUTROABS 3.5 09/16/2018    CMP  Lab Results  Component Value Date   NA 143 09/03/2018   K 4.0 09/03/2018   CL 107 09/03/2018   CO2 27 09/03/2018   GLUCOSE 75 09/03/2018   BUN 15 09/03/2018   CREATININE 1.36 (H) 09/03/2018   CALCIUM 10.2 09/03/2018   PROT 6.4 (L) 09/03/2018   ALBUMIN 4.0 09/03/2018   AST 22 09/03/2018   ALT 31 09/03/2018   ALKPHOS 49 09/03/2018   BILITOT 0.7 09/03/2018   GFRNONAA 56 (L) 09/03/2018   GFRAA >60 09/03/2018     Medications: I have reviewed the patient's current medications.   Assessment/Plan: 1. Multiple myeloma   Anemia/thrombocytopenia  Bone marrow biopsy 01/25/2018-hypercellular bone marrow with extensive involvement by plasma cell neoplasm; flow cytometry with a population of cells with plasmacytic differentiation (13%), no monoclonal B-cell population identified, T cells with nonspecific changes.  Cytogenetics-multiple rearrangements, FISH panel negative for cytogenetic  abnormalities to be associated with multiple myeloma including loss of p53  01/24/2018 SPEP-noM spike,elevated serum lambda free light chains (432)  Dexamethasone 40 mg daily times 4 days beginning 01/25/2018  Velcade/dexamethasone weekly x3 followed by a 1 week break beginning3/26/2019(weekly dexamethasone initiated 02/05/2018, Revlimid initiated 02/09/2018)  Lambda light chains improved 02/26/2018  Cycle 2 RVD 03/05/2018 (Velcade dose reduced due to neutropenia), day 8 Velcade held and Revlimid discontinued secondary to recurrent severe neutropenia;day 15 Revlimid resumed x1 week, Velcade held;  Revlimid and Velcade placed on hold 04/02/2018 due to persistent neutropenia.  Bone marrow biopsy at [facility] 04/18/2018-hypercellular marrow, no evidence of myeloma  Cycle 3 RVD started 04/23/2018, Revlimid 5 mg dailyfor 14 days beginning 05/01/2018  Cycle 4 RVD 05/22/2018,Revlimid 5 mg daily for 14 days beginning 05/29/2018  Cycle 5 RVD 06/18/2018, Velcade escalated to standard dosing, Revlimid increased to 10 mg  Cycle 6 RVD 07/16/2018 but discontinued after day 1 Velcade  Stem cell harvest at [facility] 08/12/2018  Cycle 7 RVD 08/20/2018  Cycle 8 RVD 09/17/2018, day 15 therapy held secondary to patient vacation 2. Hypercalcemia status post pamidronate 01/24/2018, resolved 3. Diffuse abnormal bone marrow signal, compression fracture of L1 4. Back painsecondary to multiple myeloma, compression fracture.Improved. 5. Anorexia/weight loss-improved 6. 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 Severe neutropenia-recurrent after Revlimid resumed 03/19/2018, resolved      Disposition: Paul Green has completed 7 cycles of RVD.  He is in clinical remission from myeloma.  We will repeat a myeloma panel when  he is here next month. The discomfort at the left shoulder is likely related to a benign musculoskeletal condition.  He will contact us for increased pain.  We will ask  for images from the recent bone survey at [facility].  15 minutes were spent with the patient today.  The majority of the time was used for counseling and coordination of care.  Betsy Coder, MD  09/16/2018  9:12 AM

## 2018-09-16 NOTE — Progress Notes (Signed)
Also reports chronic back pain-unchanged

## 2018-09-17 ENCOUNTER — Ambulatory Visit: Payer: BLUE CROSS/BLUE SHIELD | Admitting: Oncology

## 2018-09-17 ENCOUNTER — Other Ambulatory Visit: Payer: Self-pay | Admitting: *Deleted

## 2018-09-17 MED ORDER — DEXAMETHASONE 4 MG PO TABS: ORAL_TABLET | ORAL | 0 refills | Status: DC

## 2018-09-18 ENCOUNTER — Other Ambulatory Visit: Payer: Self-pay | Admitting: Oncology

## 2018-09-18 ENCOUNTER — Ambulatory Visit
Admission: RE | Admit: 2018-09-18 | Discharge: 2018-09-18 | Disposition: A | Discharge status: Home / Self Care | Payer: Self-pay | Source: Ambulatory Visit | Attending: Oncology | Admitting: Oncology

## 2018-09-18 DIAGNOSIS — C9000 Multiple myeloma not having achieved remission: Secondary | ICD-10-CM

## 2018-09-19 ENCOUNTER — Other Ambulatory Visit: Payer: Self-pay | Admitting: Nurse Practitioner

## 2018-09-19 ENCOUNTER — Telehealth: Payer: Self-pay | Admitting: *Deleted

## 2018-09-19 DIAGNOSIS — C9000 Multiple myeloma not having achieved remission: Secondary | ICD-10-CM

## 2018-09-19 NOTE — Telephone Encounter (Addendum)
Called to report continued "severe pain" in left shoulder. Has MD seen bone survey results yet. Hoping for some resolution to his pain as soon as possible. Per Dr. Benay Spice : nothing obvious on scan and [facility] made no mention of abnormality in shoulder. Suggests non-contrast CT of left shoulder and then based on this send to ortho. Patient agrees with this plan. Orders placed.

## 2018-09-20 ENCOUNTER — Telehealth: Payer: Self-pay | Admitting: *Deleted

## 2018-09-20 NOTE — Telephone Encounter (Signed)
Wife called to report he has had low grade fever of 100-100.5 for past couple days using forehead thermometer. Using Tylenol prn. Had mild chills/nausea briefly that has resolved. Some fatigue. Just wanted MD aware. MD notified. Could be fighting a virus. Continue to monitor and push fluids. Call for fever > 101. Suggested she obtain an oral thermometer.

## 2018-09-22 ENCOUNTER — Other Ambulatory Visit: Payer: Self-pay | Admitting: Oncology

## 2018-09-23 ENCOUNTER — Telehealth: Payer: Self-pay | Admitting: *Deleted

## 2018-09-23 MED ORDER — TRAMADOL HCL 50 MG PO TABS: 50.0000 mg | ORAL_TABLET | Freq: Four times a day (QID) | ORAL | 0 refills | Status: DC | PRN

## 2018-09-23 NOTE — Telephone Encounter (Signed)
Notified patient that Dr. Benay Spice approved script for tramadol. Will continue to follow up on the authorization process for his scan.

## 2018-09-23 NOTE — Telephone Encounter (Signed)
Patient left message reporting he is in "severe pain" in left shoulder. Can't sleep. Asking for status of CT scan appointment. Expresses that he feels he is being ignored. Going out of town end of week and wants resolution prior. Checked with with managed care:  11/15-[facility] reported it did not meet medical necessity and they needed clinical.  11/18: Medical data to support need for scan submitted at 10:44 (in review).

## 2018-09-24 ENCOUNTER — Other Ambulatory Visit: Payer: Self-pay | Admitting: *Deleted

## 2018-09-24 ENCOUNTER — Inpatient Hospital Stay: Payer: BLUE CROSS/BLUE SHIELD

## 2018-09-24 VITALS — BP 139/92 | HR 98 | Temp 97.7°F | Resp 18 | Wt 192.8 lb

## 2018-09-24 DIAGNOSIS — M545 Low back pain: Secondary | ICD-10-CM | POA: Diagnosis not present

## 2018-09-24 DIAGNOSIS — R630 Anorexia: Secondary | ICD-10-CM | POA: Diagnosis not present

## 2018-09-24 DIAGNOSIS — C9000 Multiple myeloma not having achieved remission: Secondary | ICD-10-CM

## 2018-09-24 DIAGNOSIS — Z5111 Encounter for antineoplastic chemotherapy: Secondary | ICD-10-CM | POA: Diagnosis not present

## 2018-09-24 DIAGNOSIS — D649 Anemia, unspecified: Secondary | ICD-10-CM | POA: Diagnosis not present

## 2018-09-24 DIAGNOSIS — Z79899 Other long term (current) drug therapy: Secondary | ICD-10-CM | POA: Diagnosis not present

## 2018-09-24 DIAGNOSIS — D696 Thrombocytopenia, unspecified: Secondary | ICD-10-CM | POA: Diagnosis not present

## 2018-09-24 DIAGNOSIS — G893 Neoplasm related pain (acute) (chronic): Secondary | ICD-10-CM | POA: Diagnosis not present

## 2018-09-24 LAB — CMP (CANCER CENTER ONLY)
ALK PHOS: 68 U/L (ref 38–126)
ALT: 67 U/L — AB (ref 0–44)
AST: 29 U/L (ref 15–41)
Albumin: 3.3 g/dL — ABNORMAL LOW (ref 3.5–5.0)
Anion gap: 13 (ref 5–15)
BUN: 14 mg/dL (ref 6–20)
CHLORIDE: 105 mmol/L (ref 98–111)
CO2: 25 mmol/L (ref 22–32)
Calcium: 9.7 mg/dL (ref 8.9–10.3)
Creatinine: 1.24 mg/dL (ref 0.61–1.24)
GFR, Est AFR Am: >60 mL/min (ref >60)
GFR, Estimated: >60 mL/min (ref >60)
GLUCOSE: 96 mg/dL (ref 70–99)
POTASSIUM: 3.8 mmol/L (ref 3.5–5.1)
SODIUM: 143 mmol/L (ref 135–145)
Total Bilirubin: 0.5 mg/dL (ref 0.3–1.2)
Total Protein: 6.8 g/dL (ref 6.5–8.1)

## 2018-09-24 LAB — CBC WITH DIFFERENTIAL (CANCER CENTER ONLY)
ABS IMMATURE GRANULOCYTES: 0.11 10*3/uL — AB (ref 0.00–0.07)
Basophils Absolute: 0 10*3/uL (ref 0.0–0.1)
Basophils Relative: 0 %
Eosinophils Absolute: 0.2 10*3/uL (ref 0.0–0.5)
Eosinophils Relative: 4 %
HCT: 37.8 % — ABNORMAL LOW (ref 39.0–52.0)
HEMOGLOBIN: 12.9 g/dL — AB (ref 13.0–17.0)
Immature Granulocytes: 2 %
LYMPHS PCT: 12 %
Lymphs Abs: 0.7 10*3/uL (ref 0.7–4.0)
MCH: 32.8 pg (ref 26.0–34.0)
MCHC: 34.1 g/dL (ref 30.0–36.0)
MCV: 96.2 fL (ref 80.0–100.0)
MONOS PCT: 15 %
Monocytes Absolute: 0.8 10*3/uL (ref 0.1–1.0)
NEUTROS ABS: 3.7 10*3/uL (ref 1.7–7.7)
Neutrophils Relative %: 67 %
Platelet Count: 227 10*3/uL (ref 150–400)
RBC: 3.93 MIL/uL — AB (ref 4.22–5.81)
RDW: 14.2 % (ref 11.5–15.5)
WBC Count: 5.5 10*3/uL (ref 4.0–10.5)
nRBC: 0 % (ref 0.0–0.2)

## 2018-09-24 MED ORDER — PROCHLORPERAZINE MALEATE 10 MG PO TABS
ORAL_TABLET | ORAL | Status: AC
  Filled 2018-09-24: qty 1

## 2018-09-24 MED ORDER — DEXAMETHASONE 4 MG PO TABS
40.0000 mg | ORAL_TABLET | ORAL | Status: DC
  Administered 2018-09-24: 40 mg via ORAL

## 2018-09-24 MED ORDER — DEXAMETHASONE 4 MG PO TABS
ORAL_TABLET | ORAL | Status: AC
  Filled 2018-09-24: qty 10

## 2018-09-24 MED ORDER — PROCHLORPERAZINE MALEATE 10 MG PO TABS
10.0000 mg | ORAL_TABLET | Freq: Once | ORAL | Status: AC
  Administered 2018-09-24: 10 mg via ORAL

## 2018-09-24 MED ORDER — HYDROCODONE-ACETAMINOPHEN 5-325 MG PO TABS: 1.0000 | ORAL_TABLET | ORAL | 0 refills | Status: DC | PRN

## 2018-09-24 MED ORDER — BORTEZOMIB CHEMO SQ INJECTION 3.5 MG (2.5MG/ML)
1.3000 mg/m2 | Freq: Once | INTRAMUSCULAR | Status: AC
  Administered 2018-09-24: 2.5 mg via SUBCUTANEOUS
  Filled 2018-09-24: qty 1

## 2018-09-24 NOTE — Patient Instructions (Signed)
[facility] Discharge Instructions for Patients Receiving Chemotherapy  Today you received the following chemotherapy agents Velcade.  To help prevent nausea and vomiting after your treatment, we encourage you to take your nausea medication as directed.  If you develop nausea and vomiting that is not controlled by your nausea medication, call the clinic.   BELOW ARE SYMPTOMS THAT SHOULD BE REPORTED IMMEDIATELY:  *FEVER GREATER THAN 100.5 F  *CHILLS WITH OR WITHOUT FEVER  NAUSEA AND VOMITING THAT IS NOT CONTROLLED WITH YOUR NAUSEA MEDICATION  *UNUSUAL SHORTNESS OF BREATH  *UNUSUAL BRUISING OR BLEEDING  TENDERNESS IN MOUTH AND THROAT WITH OR WITHOUT PRESENCE OF ULCERS  *URINARY PROBLEMS  *BOWEL PROBLEMS  UNUSUAL RASH Items with * indicate a potential emergency and should be followed up as soon as possible.  Feel free to call the clinic should you have any questions or concerns. The clinic phone number is [phone].  Please show the CHEMO ALERT CARD at check-in to the Emergency Department and triage nurse.   

## 2018-09-25 ENCOUNTER — Other Ambulatory Visit: Payer: Self-pay | Admitting: Nurse Practitioner

## 2018-09-25 ENCOUNTER — Ambulatory Visit (HOSPITAL_COMMUNITY)
Admission: RE | Admit: 2018-09-25 | Discharge: 2018-09-25 | Disposition: A | Discharge status: Home / Self Care | Payer: BLUE CROSS/BLUE SHIELD | Source: Ambulatory Visit | Attending: Nurse Practitioner | Admitting: Nurse Practitioner

## 2018-09-25 ENCOUNTER — Encounter (HOSPITAL_COMMUNITY): Payer: Self-pay

## 2018-09-25 DIAGNOSIS — C9000 Multiple myeloma not having achieved remission: Secondary | ICD-10-CM

## 2018-09-25 DIAGNOSIS — M19012 Primary osteoarthritis, left shoulder: Secondary | ICD-10-CM | POA: Diagnosis not present

## 2018-09-26 ENCOUNTER — Other Ambulatory Visit: Payer: Self-pay | Admitting: *Deleted

## 2018-09-26 ENCOUNTER — Telehealth: Payer: Self-pay | Admitting: *Deleted

## 2018-09-26 DIAGNOSIS — C9000 Multiple myeloma not having achieved remission: Secondary | ICD-10-CM

## 2018-09-26 MED ORDER — LENALIDOMIDE 10 MG PO CAPS: ORAL_CAPSULE | ORAL | 0 refills | Status: DC

## 2018-09-26 NOTE — Telephone Encounter (Signed)
Patient made aware again of CT result and Dr. Benay Spice will refer to ortho if he wishes. He stated he does want to see ortho MD as soon as possible. Called [facility] and was provided appointment for 09/27/18 at 3 pm with Dr. Tonita Cong. Patient notified.

## 2018-09-27 DIAGNOSIS — M7542 Impingement syndrome of left shoulder: Secondary | ICD-10-CM | POA: Diagnosis not present

## 2018-09-27 DIAGNOSIS — M25511 Pain in right shoulder: Secondary | ICD-10-CM | POA: Diagnosis not present

## 2018-09-27 DIAGNOSIS — C9000 Multiple myeloma not having achieved remission: Secondary | ICD-10-CM | POA: Diagnosis not present

## 2018-10-11 ENCOUNTER — Other Ambulatory Visit: Payer: Self-pay | Admitting: Oncology

## 2018-10-11 ENCOUNTER — Inpatient Hospital Stay: Payer: BLUE CROSS/BLUE SHIELD | Attending: Oncology | Admitting: Oncology

## 2018-10-11 ENCOUNTER — Other Ambulatory Visit: Payer: Self-pay

## 2018-10-11 ENCOUNTER — Ambulatory Visit (HOSPITAL_COMMUNITY)
Admission: RE | Admit: 2018-10-11 | Discharge: 2018-10-11 | Disposition: A | Discharge status: Home / Self Care | Payer: BLUE CROSS/BLUE SHIELD | Source: Ambulatory Visit | Attending: Oncology | Admitting: Oncology

## 2018-10-11 ENCOUNTER — Telehealth: Payer: Self-pay | Admitting: *Deleted

## 2018-10-11 ENCOUNTER — Inpatient Hospital Stay: Payer: BLUE CROSS/BLUE SHIELD

## 2018-10-11 ENCOUNTER — Other Ambulatory Visit: Payer: Self-pay | Admitting: *Deleted

## 2018-10-11 VITALS — BP 132/90 | HR 100 | Temp 98.1°F | Resp 18 | Ht 66.0 in | Wt 188.6 lb

## 2018-10-11 DIAGNOSIS — M25512 Pain in left shoulder: Secondary | ICD-10-CM | POA: Insufficient documentation

## 2018-10-11 DIAGNOSIS — R918 Other nonspecific abnormal finding of lung field: Secondary | ICD-10-CM | POA: Insufficient documentation

## 2018-10-11 DIAGNOSIS — R600 Localized edema: Secondary | ICD-10-CM | POA: Diagnosis not present

## 2018-10-11 DIAGNOSIS — R05 Cough: Secondary | ICD-10-CM | POA: Insufficient documentation

## 2018-10-11 DIAGNOSIS — R51 Headache: Secondary | ICD-10-CM | POA: Diagnosis not present

## 2018-10-11 DIAGNOSIS — Z79899 Other long term (current) drug therapy: Secondary | ICD-10-CM | POA: Diagnosis not present

## 2018-10-11 DIAGNOSIS — D6959 Other secondary thrombocytopenia: Secondary | ICD-10-CM | POA: Diagnosis not present

## 2018-10-11 DIAGNOSIS — G038 Meningitis due to other specified causes: Secondary | ICD-10-CM | POA: Insufficient documentation

## 2018-10-11 DIAGNOSIS — M542 Cervicalgia: Secondary | ICD-10-CM | POA: Insufficient documentation

## 2018-10-11 DIAGNOSIS — M47812 Spondylosis without myelopathy or radiculopathy, cervical region: Secondary | ICD-10-CM | POA: Insufficient documentation

## 2018-10-11 DIAGNOSIS — M6281 Muscle weakness (generalized): Secondary | ICD-10-CM | POA: Diagnosis not present

## 2018-10-11 DIAGNOSIS — C9000 Multiple myeloma not having achieved remission: Secondary | ICD-10-CM

## 2018-10-11 DIAGNOSIS — M62830 Muscle spasm of back: Secondary | ICD-10-CM | POA: Insufficient documentation

## 2018-10-11 DIAGNOSIS — Z5112 Encounter for antineoplastic immunotherapy: Secondary | ICD-10-CM | POA: Diagnosis not present

## 2018-10-11 DIAGNOSIS — H532 Diplopia: Secondary | ICD-10-CM | POA: Insufficient documentation

## 2018-10-11 DIAGNOSIS — M2578 Osteophyte, vertebrae: Secondary | ICD-10-CM | POA: Diagnosis not present

## 2018-10-11 DIAGNOSIS — R531 Weakness: Secondary | ICD-10-CM | POA: Diagnosis not present

## 2018-10-11 DIAGNOSIS — J069 Acute upper respiratory infection, unspecified: Secondary | ICD-10-CM

## 2018-10-11 DIAGNOSIS — D649 Anemia, unspecified: Secondary | ICD-10-CM | POA: Insufficient documentation

## 2018-10-11 DIAGNOSIS — G588 Other specified mononeuropathies: Secondary | ICD-10-CM | POA: Insufficient documentation

## 2018-10-11 LAB — CMP (CANCER CENTER ONLY)
ALBUMIN: 3.3 g/dL — AB (ref 3.5–5.0)
ALT: 32 U/L (ref 0–44)
ANION GAP: 13 (ref 5–15)
AST: 22 U/L (ref 15–41)
Alkaline Phosphatase: 71 U/L (ref 38–126)
BILIRUBIN TOTAL: 0.6 mg/dL (ref 0.3–1.2)
BUN: 12 mg/dL (ref 6–20)
CO2: 24 mmol/L (ref 22–32)
Calcium: 9.4 mg/dL (ref 8.9–10.3)
Chloride: 101 mmol/L (ref 98–111)
Creatinine: 1.06 mg/dL (ref 0.61–1.24)
GFR, Est AFR Am: >60 mL/min (ref >60)
GLUCOSE: 108 mg/dL — AB (ref 70–99)
POTASSIUM: 3.7 mmol/L (ref 3.5–5.1)
Sodium: 138 mmol/L (ref 135–145)
TOTAL PROTEIN: 6.8 g/dL (ref 6.5–8.1)

## 2018-10-11 LAB — CBC WITH DIFFERENTIAL (CANCER CENTER ONLY)
ABS IMMATURE GRANULOCYTES: 0.05 10*3/uL (ref 0.00–0.07)
BASOS ABS: 0.1 10*3/uL (ref 0.0–0.1)
BASOS PCT: 1 %
Eosinophils Absolute: 0.1 10*3/uL (ref 0.0–0.5)
Eosinophils Relative: 2 %
HCT: 37.4 % — ABNORMAL LOW (ref 39.0–52.0)
Hemoglobin: 12.5 g/dL — ABNORMAL LOW (ref 13.0–17.0)
IMMATURE GRANULOCYTES: 1 %
Lymphocytes Relative: 10 %
Lymphs Abs: 0.6 10*3/uL — ABNORMAL LOW (ref 0.7–4.0)
MCH: 32.1 pg (ref 26.0–34.0)
MCHC: 33.4 g/dL (ref 30.0–36.0)
MCV: 96.1 fL (ref 80.0–100.0)
Monocytes Absolute: 0.9 10*3/uL (ref 0.1–1.0)
Monocytes Relative: 14 %
NEUTROS ABS: 4.6 10*3/uL (ref 1.7–7.7)
NEUTROS PCT: 72 %
PLATELETS: 169 10*3/uL (ref 150–400)
RBC: 3.89 MIL/uL — AB (ref 4.22–5.81)
RDW: 14.9 % (ref 11.5–15.5)
WBC: 6.2 10*3/uL (ref 4.0–10.5)
nRBC: 0 % (ref 0.0–0.2)

## 2018-10-11 MED ORDER — HYDROCODONE-ACETAMINOPHEN 5-325 MG PO TABS: 1.0000 | ORAL_TABLET | ORAL | 0 refills | Status: DC | PRN

## 2018-10-11 MED ORDER — AMOXICILLIN-POT CLAVULANATE ER 1000-62.5 MG PO TB12: 2.0000 | ORAL_TABLET | Freq: Two times a day (BID) | ORAL | 1 refills | Status: DC

## 2018-10-11 MED ORDER — DEXAMETHASONE 4 MG PO TABS: 4.0000 mg | ORAL_TABLET | Freq: Two times a day (BID) | ORAL | 0 refills | Status: DC

## 2018-10-11 MED ORDER — AZITHROMYCIN 250 MG PO TABS: ORAL_TABLET | ORAL | 0 refills | Status: DC

## 2018-10-11 NOTE — Progress Notes (Signed)
[place] OFFICE PROGRESS NOTE   Diagnosis: Multiple myeloma  INTERVAL HISTORY:   [name] returns prior to his scheduled visit.  He completed another cycle of RVD beginning 09/16/2018.  Day team Velcade was held secondary to a vacation. He has developed aggressive left shoulder discomfort over the past several months.  A CT of the left shoulder 09/25/2018 revealed no focal bone lesion.  He was referred to orthopedics.  He reports a steroid injection helped the pain partially. He developed an upper respiratory infection beginning 10/05/2018 with a cough, erythema of the eyes, and sinus drainage.  He had a fever of 101 degrees last night.  No dyspnea. He complains of progressive left greater than right "shoulder "pain, neck pain, a posterior headache, and arm weakness over the past week.  He takes hydrocodone for relief of pain.  He has noted mild weakness of both arms.  No leg weakness.  Objective:  Vital signs in last 24 hours:  Blood pressure 132/90, pulse 100, temperature 98.1 F (36.7 C), temperature source Oral, resp. rate 18, height '5\' 6"'  (1.676 m), weight 188 lb 9.6 oz (85.5 kg), SpO2 99 %.    HEENT: No thrush, pharynx without erythema or exudate Resp: Lungs clear bilaterally in the anterior and posterior lung fields, no respiratory distress Cardio: Regular rate and rhythm GI: No hepatosplenomegaly Vascular: No leg edema Neuro: 4+/5 strength with grip and dorsiflexion at the left hand, the arm and leg strength is otherwise intact bilaterally Musculoskeletal: Tender at the lower posterior neck over the spine   Lab Results:  Lab Results  Component Value Date   WBC 6.2 10/11/2018   HGB 12.5 (L) 10/11/2018   HCT 37.4 (L) 10/11/2018   MCV 96.1 10/11/2018   PLT 169 10/11/2018   NEUTROABS 4.6 10/11/2018    CMP  Lab Results  Component Value Date   NA 138 10/11/2018   K 3.7 10/11/2018   CL 101 10/11/2018   CO2 24 10/11/2018   GLUCOSE 108 (H)  10/11/2018   BUN 12 10/11/2018   CREATININE 1.06 10/11/2018   CALCIUM 9.4 10/11/2018   PROT 6.8 10/11/2018   ALBUMIN 3.3 (L) 10/11/2018   AST 22 10/11/2018   ALT 32 10/11/2018   ALKPHOS 71 10/11/2018   BILITOT 0.6 10/11/2018   GFRNONAA >60 10/11/2018   GFRAA >60 10/11/2018    Medications: I have reviewed the patient's current medications.   Assessment/Plan: 1. Multiple myeloma   Anemia/thrombocytopenia  Bone marrow biopsy 01/25/2018-hypercellular bone marrow with extensive involvement by plasma cell neoplasm; flow cytometry with a population of cells with plasmacytic differentiation (13%), no monoclonal B-cell population identified, T cells with nonspecific changes.  Cytogenetics-multiple rearrangements, FISH panel negative for cytogenetic abnormalities to be associated with multiple myeloma including loss of p53  01/24/2018 SPEP-noM spike,elevated serum lambda free light chains (432)  Dexamethasone 40 mg daily times 4 days beginning 01/25/2018  Velcade/dexamethasone weekly x3 followed by a 1 week break beginning3/26/2019(weekly dexamethasone initiated 02/05/2018, Revlimid initiated 02/09/2018)  Lambda light chains improved 02/26/2018  Cycle 2 RVD 03/05/2018 (Velcade dose reduced due to neutropenia), day 8 Velcade held and Revlimid discontinued secondary to recurrent severe neutropenia;day 15 Revlimid resumed x1 week, Velcade held;  Revlimid and Velcade placed on hold 04/02/2018 due to persistent neutropenia.  Bone marrow biopsy at [facility] 04/18/2018-hypercellular marrow, no evidence of myeloma  Cycle 3 RVD started 04/23/2018, Revlimid 5 mg dailyfor 14 days beginning 05/01/2018  Cycle 4 RVD 05/22/2018,Revlimid 5 mg daily for 14 days  beginning 05/29/2018  Cycle 5 RVD 06/18/2018, Velcade escalated to standard dosing, Revlimid increased to 10 mg  Cycle 6 RVD 9/10/2019but discontinued after day 1 Velcade  Stem cell harvest at [facility] 08/12/2018  Cycle 7 RVD 08/20/2018  Cycle 8 RVD  09/17/2018, day 15 therapy held secondary to patient vacation 2. Hypercalcemia status post pamidronate 01/24/2018, resolved 3. Diffuse abnormal bone marrow signal, compression fracture of L1 4. Back painsecondary to multiple myeloma, compression fracture.Improved. 5. Anorexia/weight loss-improved 6. 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 Severe neutropenia-recurrent after Revlimid resumed 03/19/2018, resolved 8. Bilateral shoulder pain, neck pain, report of arm weakness   Disposition: Paul Green has completed 8 cycles of RVD.  We obtained repeat serum light chains and a serum protein electropheresis today. He presents today with symptoms of an upper respiratory infection.  I suspect he has a viral upper respiratory infection.  We will obtain a chest x-ray to rule out pneumonia.  He has complained of pain at the left shoulder over the past month.  A CT of the left shoulder was unremarkable.  He saw orthopedics and underwent injection therapy with partial improvement.  He now has neck and bilateral shoulder pain.  He complains of weakness of the arms.  I will refer him for an urgent MRI of the cervical spine to look for evidence of a cervical process accounting for his symptoms.  He will seek medical attention for progressive weakness, numbness, or lower extremity neurologic symptoms.  He will continue hydrocodone as needed for pain.  We will follow-up on the chest x-ray and MRI today.  He will return for an office visit as scheduled on 10/15/2018.  40 minutes were spent with the patient today.  The majority of the time was used for counseling and coordination of care.  Betsy Coder, MD  10/11/2018  2:34 PM

## 2018-10-11 NOTE — Telephone Encounter (Signed)
Called to report URI with cough, worse when reclined. Green sputum. Temp 100 to 101. Drainage from his eyes. Feels so weak in neck/shoulders that he can barely lift his arms. Asking if Dr. Benay Spice wants to see him or should he call his PCP? Dr. Benay Spice will see him today at 10:00.

## 2018-10-11 NOTE — Telephone Encounter (Signed)
CXR shows probable pneumonia in right lung field. Also MRI shows probable myeloma involvement at C2, which can explain his pain. Informed him of antibiotics sent to his pharmacy and to take decadron 4 mg bid. Dr. Benay Spice will be speaking with Dr. Lisbeth Renshaw in radiation oncology about RT to the area. Should hear something by Monday. Instructed him to call MD on call for high fever, shortness of breath or progression of neuro symptoms. He understands and agrees.

## 2018-10-14 LAB — KAPPA/LAMBDA LIGHT CHAINS
KAPPA FREE LGHT CHN: 6 mg/L (ref 3.3–19.4)
Kappa, lambda light chain ratio: 0.07 — ABNORMAL LOW (ref 0.26–1.65)
LAMDA FREE LIGHT CHAINS: 87 mg/L — AB (ref 5.7–26.3)

## 2018-10-14 NOTE — Progress Notes (Signed)
Histology and Location of Primary Cancer: Multiple Myeloma lesion at C2  Patient presented with arm weakness and neck pain.  MRI C Spine 10/11/2018: Abnormal marrow signal of the C2 vertebral body worrisome for myeloma involvement.  There is also some involvement of the left upper corner of the C3 vertebral body.  Cystic area in the C4 vertebral body most consistent with a previously treated myeloma lesion.  CT left shoulder 09/25/2018: no focal bone lesion.  Past/Anticipated chemotherapy by medical oncology, if any:  Dr. Benay Spice 10/11/2018 -Completed 8 cycles of RVD-chemo.  We obtained repeat serum light chains and a serum protein electropheresis 10/11/2018. -He presents with symptoms of upper respiratory infection, I will obtain a chest x ray to rule out pneumonia. -He has complained of pain at the left shoulder, CT was unremarkable. -He saw orthopedics and underwent injection therapy with partial improvement.  He now has neck and bilateral shoulder pain.   -He complains of weakness of the arms, I will refer him for an urgent MRI of the cervical spine to look for evidence of a cervical process accounting for his symptoms. -We will follow-up on the chest xray and MRI, he will return for an office visit as scheduled 10/15/2018. -MRI shows probable myeloma involvement at C2. -Referral made to Dr. Lisbeth Renshaw for discussion of RT to C2.  10/15/2018   Pain on a scale of 0-10 is:   If Spine Met(s), symptoms, if any, include:  Bowel/Bladder retention or incontinence (please describe):   Numbness or weakness in extremities (please describe):   Current Decadron regimen, if applicable: 40 mg with velcade  Ambulatory status? Walker? Wheelchair?: Ambulatory  BP (!) 131/91 (BP Location: Right Arm, Patient Position: Sitting)   Pulse 98   Temp 97.7 F (36.5 C) (Oral)   Resp 20   Ht '5\' 6"'  (1.676 m)   Wt 191 lb 3.2 oz (86.7 kg)   SpO2 95%   BMI 30.86 kg/m    Wt Readings from Last 3 Encounters:   10/15/18 191 lb 3.2 oz (86.7 kg)  10/15/18 191 lb 8 oz (86.9 kg)  10/11/18 188 lb 9.6 oz (85.5 kg)    SAFETY ISSUES:  Prior radiation? No  Pacemaker/ICD? No  Possible current pregnancy? No  Is the patient on methotrexate? No  Current Complaints / other details:

## 2018-10-15 ENCOUNTER — Inpatient Hospital Stay (HOSPITAL_BASED_OUTPATIENT_CLINIC_OR_DEPARTMENT_OTHER): Payer: BLUE CROSS/BLUE SHIELD | Admitting: Oncology

## 2018-10-15 ENCOUNTER — Inpatient Hospital Stay: Payer: BLUE CROSS/BLUE SHIELD

## 2018-10-15 ENCOUNTER — Other Ambulatory Visit: Payer: Self-pay

## 2018-10-15 ENCOUNTER — Telehealth: Payer: Self-pay | Admitting: Oncology

## 2018-10-15 ENCOUNTER — Ambulatory Visit
Admission: RE | Admit: 2018-10-15 | Discharge: 2018-10-15 | Disposition: A | Discharge status: Home / Self Care | Payer: BLUE CROSS/BLUE SHIELD | Source: Ambulatory Visit | Attending: Radiation Oncology | Admitting: Radiation Oncology

## 2018-10-15 ENCOUNTER — Encounter: Payer: Self-pay | Admitting: Radiation Oncology

## 2018-10-15 VITALS — BP 131/91 | HR 98 | Temp 97.7°F | Resp 20 | Ht 66.0 in | Wt 191.2 lb

## 2018-10-15 VITALS — BP 148/99 | HR 104 | Temp 97.6°F | Resp 19 | Ht 66.0 in | Wt 191.5 lb

## 2018-10-15 DIAGNOSIS — C9000 Multiple myeloma not having achieved remission: Secondary | ICD-10-CM

## 2018-10-15 DIAGNOSIS — D649 Anemia, unspecified: Secondary | ICD-10-CM

## 2018-10-15 DIAGNOSIS — C9001 Multiple myeloma in remission: Secondary | ICD-10-CM | POA: Insufficient documentation

## 2018-10-15 DIAGNOSIS — M25512 Pain in left shoulder: Secondary | ICD-10-CM

## 2018-10-15 DIAGNOSIS — Z79899 Other long term (current) drug therapy: Secondary | ICD-10-CM | POA: Diagnosis not present

## 2018-10-15 DIAGNOSIS — R51 Headache: Secondary | ICD-10-CM

## 2018-10-15 DIAGNOSIS — D6959 Other secondary thrombocytopenia: Secondary | ICD-10-CM

## 2018-10-15 DIAGNOSIS — G893 Neoplasm related pain (acute) (chronic): Secondary | ICD-10-CM | POA: Diagnosis not present

## 2018-10-15 DIAGNOSIS — M6281 Muscle weakness (generalized): Secondary | ICD-10-CM

## 2018-10-15 DIAGNOSIS — Z9289 Personal history of other medical treatment: Secondary | ICD-10-CM | POA: Diagnosis not present

## 2018-10-15 DIAGNOSIS — M542 Cervicalgia: Secondary | ICD-10-CM

## 2018-10-15 HISTORY — DX: Multiple myeloma not having achieved remission: C90.00

## 2018-10-15 MED ORDER — PROCHLORPERAZINE MALEATE 10 MG PO TABS
ORAL_TABLET | ORAL | Status: AC
  Filled 2018-10-15: qty 1

## 2018-10-15 MED ORDER — OXYCODONE-ACETAMINOPHEN 5-325 MG PO TABS
1.0000 | ORAL_TABLET | Freq: Once | ORAL | Status: AC
  Administered 2018-10-15: 1 via ORAL
  Filled 2018-10-15: qty 1

## 2018-10-15 MED ORDER — BORTEZOMIB CHEMO SQ INJECTION 3.5 MG (2.5MG/ML)
1.3000 mg/m2 | Freq: Once | INTRAMUSCULAR | Status: AC
  Administered 2018-10-15: 2.5 mg via SUBCUTANEOUS
  Filled 2018-10-15: qty 1

## 2018-10-15 MED ORDER — DEXAMETHASONE 4 MG PO TABS
40.0000 mg | ORAL_TABLET | ORAL | Status: DC
  Administered 2018-10-15: 40 mg via ORAL

## 2018-10-15 MED ORDER — PROCHLORPERAZINE MALEATE 10 MG PO TABS
10.0000 mg | ORAL_TABLET | Freq: Once | ORAL | Status: AC
  Administered 2018-10-15: 10 mg via ORAL

## 2018-10-15 MED ORDER — DEXAMETHASONE 4 MG PO TABS
ORAL_TABLET | ORAL | Status: AC
  Filled 2018-10-15: qty 10

## 2018-10-15 NOTE — Addendum Note (Signed)
Addended by: Betsy Coder B on: 10/15/2018 10:19 AM   Modules accepted: Orders

## 2018-10-15 NOTE — Progress Notes (Signed)
[facility] OFFICE PROGRESS NOTE   Diagnosis: Multiple myeloma  INTERVAL HISTORY:   Mr. Jolliff returns as scheduled.  He reports resolution of the cough.  No further fever.  He is completing the course of Augmentin. He continues to have pain in the left greater than right shoulder.  The pain has not improved with the current Decadron dose.  He takes hydrocodone as needed for pain.  He has mild weakness of the left arm and hand. He reports mild discomfort at the left anterior thigh, just above the knee. Objective:  Vital signs in last 24 hours:  Blood pressure (!) 148/99, pulse (!) 104, temperature 97.6 F (36.4 C), temperature source Oral, resp. rate 19, height '5\' 6"'  (1.676 m), weight 191 lb 8 oz (86.9 kg), SpO2 97 %.    HEENT: No thrush Resp: Lungs clear bilaterally Cardio: Regular rate and rhythm GI: No hepatosplenomegaly, nontender Vascular: No leg edema, erythema, or palpable cord Neuro: 4/5 strength at the left arm and hand Musculoskeletal: Mild tenderness at the left suprapatellar bursa and distal anterior thigh musculature, no mass, erythema, or fluctuance    Lab Results:  Lab Results  Component Value Date   WBC 6.2 10/11/2018   HGB 12.5 (L) 10/11/2018   HCT 37.4 (L) 10/11/2018   MCV 96.1 10/11/2018   PLT 169 10/11/2018   NEUTROABS 4.6 10/11/2018    CMP  Lab Results  Component Value Date   NA 138 10/11/2018   K 3.7 10/11/2018   CL 101 10/11/2018   CO2 24 10/11/2018   GLUCOSE 108 (H) 10/11/2018   BUN 12 10/11/2018   CREATININE 1.06 10/11/2018   CALCIUM 9.4 10/11/2018   PROT 6.8 10/11/2018   ALBUMIN 3.3 (L) 10/11/2018   AST 22 10/11/2018   ALT 32 10/11/2018   ALKPHOS 71 10/11/2018   BILITOT 0.6 10/11/2018   GFRNONAA >60 10/11/2018   GFRAA >60 10/11/2018  Free lambda light chains-87   Imaging:  Dg Chest 2 View  Result Date: 10/11/2018 CLINICAL DATA:  57 year old male with multiple myeloma and cough EXAM: CHEST - 2 VIEW  COMPARISON:  01/23/2018 FINDINGS: Cardiomediastinal silhouette unchanged in size and contour. Low lung volumes. Patchy airspace opacity in the right lower lung. No pneumothorax. No pleural effusion. No displaced fracture.  Degenerative changes. IMPRESSION: Low lung volumes, with airspace opacity in the right lower lung concerning for infection given the history. Followup PA and lateral chest X-ray is recommended in 3-4 weeks following treatment to assure resolution. Electronically Signed   By: Corrie Mckusick D.O.   On: 10/11/2018 16:42   Mr Cervical Spine Wo Contrast  Result Date: 10/11/2018 CLINICAL DATA:  Multiple myeloma in remission. Neck pain and arm weakness. EXAM: MRI CERVICAL SPINE WITHOUT CONTRAST TECHNIQUE: Multiplanar, multisequence MR imaging of the cervical spine was performed. No intravenous contrast was administered. COMPARISON:  Radiography 07/30/2018 FINDINGS: Alignment: Normal Vertebrae: Fluid intensity focus within the anterior central C4 vertebral body, most consistent with a previously treated myeloma lesion. The appearance is not presently suggestive of active tumor. There is abnormal marrow space signal of the C2 vertebra, worrisome for myeloma involvement. No evidence of fracture. There is also some involvement on the left at the upper C3 vertebral body. There could be some extraosseous tumor in that region, possibly involving the paraspinous soft tissues and foramen transversarium region. Cord: No cord compression or primary cord lesion. Posterior Fossa, vertebral arteries, paraspinal tissues: Some mucosal inflammatory changes of the sphenoid sinus. In the posterior portion  of the sphenoid sinus, there is an abnormality measuring up to 3.3 cm in size that probably represents a chronic retention cyst with mucoid material. However, the possibility of a mass or polyp is not completely excluded based on these images. This could be evaluated at the time of postcontrast cervical imaging. Disc  levels: Foramen magnum is widely patent. There is minimal non-compressive spondylosis at C2-3, C3-4 C4-5 and C5-6. At C6-7, there is a broad-based right posterolateral predominant disc osteophyte complex that narrows the right side of the canal but does not compress the cord. Right foraminal narrowing could affect the exiting C7 nerve. C7-T1: Negative. IMPRESSION: Abnormal marrow signal of the C2 vertebral body worrisome for myeloma involvement. There is also some involvement of the left upper corner of the C3 vertebral body. Question some extraosseous tumor on the left, possibly with some involvement of the intervertebral foramen and transverse foramen. I would suggest postcontrast imaging to evaluate this further. Cystic area in the C4 vertebral body most consistent with a previously treated myeloma lesion. Probable retention cyst in the sphenoid sinus. Mass or polyp not completely excluded. This could be evaluated at the time of postcontrast imaging. Right-sided predominant spondylosis at C6-7 with right foraminal narrowing due to osteophyte and disc material that could compress the right C7 nerve. Electronically Signed   By: Nelson Chimes M.D.   On: 10/11/2018 16:04    Medications: I have reviewed the patient's current medications.   Assessment/Plan: 1. Multiple myeloma   Anemia/thrombocytopenia  Bone marrow biopsy 01/25/2018-hypercellular bone marrow with extensive involvement by plasma cell neoplasm; flow cytometry with a population of cells with plasmacytic differentiation (13%), no monoclonal B-cell population identified, T cells with nonspecific changes.  Cytogenetics-multiple rearrangements, FISH panel negative for cytogenetic abnormalities to be associated with multiple myeloma including loss of p53  01/24/2018 SPEP-noM spike,elevated serum lambda free light chains (432)  Dexamethasone 40 mg daily times 4 days beginning 01/25/2018  Velcade/dexamethasone weekly x3 followed by a 1 week  break beginning3/26/2019(weekly dexamethasone initiated 02/05/2018, Revlimid initiated 02/09/2018)  Lambda light chains improved 02/26/2018  Cycle 2 RVD 03/05/2018 (Velcade dose reduced due to neutropenia), day 8 Velcade held and Revlimid discontinued secondary to recurrent severe neutropenia;day 15 Revlimid resumed x1 week, Velcade held;  Revlimid and Velcade placed on hold 04/02/2018 due to persistent neutropenia.  Bone marrow biopsy at [facility] 04/18/2018-hypercellular marrow, no evidence of myeloma  Cycle 3 RVD started 04/23/2018, Revlimid 5 mg dailyfor 14 days beginning 05/01/2018  Cycle 4 RVD 05/22/2018,Revlimid 5 mg daily for 14 days beginning 05/29/2018  Cycle 5 RVD 06/18/2018, Velcade escalated to standard dosing, Revlimid increased to 10 mg  Cycle 6 RVD 9/10/2019but discontinued after day 1 Velcade  Stem cell harvest at [facility] 08/12/2018  Cycle 7 RVD 08/20/2018  Cycle 8 RVD 09/17/2018, day 15 therapy held secondary to patient vacation 2. Hypercalcemia status post pamidronate 01/24/2018, resolved 3. Diffuse abnormal bone marrow signal, compression fracture of L1 4. Back painsecondary to multiple myeloma, compression fracture.Improved. 5. Anorexia/weight loss-improved 6. 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 Severe neutropenia-recurrent after Revlimid resumed 03/19/2018, resolved 8. Bilateral shoulder pain, neck pain, left arm weakness 10/11/2018-MRI cervical spine- abnormal marrow signal at C2 and upper corner of C3, involvement of the transverse and intervertebral foramen, treated myeloma lesion at C4 9. Pneumonia 10/11/2018-treated with Augmentin and azithromycin     Disposition: Mr. Florea appears stable.  He is completing a course of Augmentin and azithromycin for pneumonia.  The respiratory symptoms have resolved.  He  continues to have pain in the neck, shoulder, and posterior head.  This appears to be related to a myeloma lesion at C2.  He  will see radiation oncology today.  He will continue hydrocodone as needed for pain.  He will contact us for increased arm weakness or new neurologic symptoms.  He will discontinue the daily Decadron.  He will be treated with weekly Decadron today as part of the RVD regimen.  The serum light chains were higher last week.  The plan is to continue RVD on the current schedule with his follow-up of his clinical status and the serum light chains.  We will consider switching to a different myeloma regimen or dose escalating the Revlimid if the light chains are higher after this cycle.  25 minutes were spent with the patient today.  The majority of the time was used for counseling and coordination of care.  Betsy Coder, MD  10/15/2018  9:10 AM

## 2018-10-15 NOTE — Telephone Encounter (Signed)
Printed calendar and avs. °

## 2018-10-15 NOTE — Patient Instructions (Signed)
[facility] Discharge Instructions for Patients Receiving Chemotherapy  Today you received the following chemotherapy agents Velcade.  To help prevent nausea and vomiting after your treatment, we encourage you to take your nausea medication as directed.  If you develop nausea and vomiting that is not controlled by your nausea medication, call the clinic.   BELOW ARE SYMPTOMS THAT SHOULD BE REPORTED IMMEDIATELY:  *FEVER GREATER THAN 100.5 F  *CHILLS WITH OR WITHOUT FEVER  NAUSEA AND VOMITING THAT IS NOT CONTROLLED WITH YOUR NAUSEA MEDICATION  *UNUSUAL SHORTNESS OF BREATH  *UNUSUAL BRUISING OR BLEEDING  TENDERNESS IN MOUTH AND THROAT WITH OR WITHOUT PRESENCE OF ULCERS  *URINARY PROBLEMS  *BOWEL PROBLEMS  UNUSUAL RASH Items with * indicate a potential emergency and should be followed up as soon as possible.  Feel free to call the clinic should you have any questions or concerns. The clinic phone number is [phone].  Please show the CHEMO ALERT CARD at check-in to the Emergency Department and triage nurse.   

## 2018-10-15 NOTE — Patient Instructions (Signed)
Please provide copy of your Advanced Directive/Living Will at next visit to scan into chart 

## 2018-10-16 ENCOUNTER — Telehealth: Payer: Self-pay | Admitting: Radiation Oncology

## 2018-10-16 ENCOUNTER — Ambulatory Visit: Payer: BLUE CROSS/BLUE SHIELD | Admitting: Radiation Oncology

## 2018-10-16 ENCOUNTER — Encounter: Payer: Self-pay | Admitting: Radiation Oncology

## 2018-10-16 NOTE — Telephone Encounter (Signed)
I spoke with the patient and let him know we reviewed his scans with Dr. Vertell Limber and he didn't see a visible change in the C6 region to describe his left sided pain in that dermatome. He does have a bone spur on the right however but his symptoms are all on the left. Dr. Vertell Limber recommended evaluation with Dr. Maryjean Ka and I've contacted Dr. Maryjean Ka to see him. The patient is in agreement with this plan.

## 2018-10-16 NOTE — Progress Notes (Signed)
Radiation Oncology         ([phone]) [phone] ________________________________  Name: Paul Green        MRN: [id number]  Date of Service: 10/15/2018 DOB: 12/07/1960  CC:[name], MD  [name], MD     REFERRING PHYSICIAN: [name], MD   DIAGNOSIS: The primary encounter diagnosis was Multiple myeloma, remission status unspecified ([place]). A diagnosis of Multiple myeloma not having achieved remission [facility]) was also pertinent to this visit.   HISTORY OF PRESENT ILLNESS: Paul Green is a 57 y.o. male seen at the request of Dr. Benay Spice for a recent history of multiple myeloma.  The patient was originally diagnosed in March 2019 after undergoing bone marrow biopsy, and was started on dexamethasone and Velcade.  Revlimid was initiated in April 2019, and his lambda light chains improved.  His disease has remained well controlled on Revlimid and he had stem cell harvesting at [facility] in October 2019.  He is completed 8 cycles of Revlimid, but recently was found to have significant neck pain and radiating pain down his left arm.  In retrospect, the patient's bone survey in March revealed significant disease within the axial and appendicular skeleton.  He had MRI scans of the lumbar and thoracic spine.  At that time, and the survey films indicated there could be some cervical disease.  He did have an MRI of the cervical spine without contrast on 10/11/2018 this revealed worrisome findings for disease within the central portion of the cervical #4 vertebral body consistent with treated line myeloma, and the appearance was not presently suggestive of active tumor.  There was abnormal signaling of C2 vertebral body worrisome for active myeloma without evidence of fracture, as well as at the level of C3.  There could be some extraosseous tumor in that region involving this paraspinous soft tissues and foramen transversarium region.  No cord compression was identified.   In addition he had changes of spondylosis at C2-C7 with a broad-based right posterior lateral predominant disc osteophyte complex narrowing the right side of the canal at C6-7 but did not compress the cord.  Because of the findings, he is to continue Revlimid and high-dose steroids, but comes today to discuss options of palliative radiotherapy for C2.    PREVIOUS RADIATION THERAPY: No   PAST MEDICAL HISTORY:  Past Medical History:  Diagnosis Date  . Multiple myeloma ([place]) 01/2018       PAST SURGICAL HISTORY: Past Surgical History:  Procedure Laterality Date  . VASECTOMY  1985     FAMILY HISTORY:  Family History  Problem Relation Age of Onset  . Skin cancer Mother   . Atrial fibrillation Mother   . Atrial fibrillation Father   . Melanoma Father   . Diabetes Sister   . Melanoma Brother   . Stroke Other   . CAD Neg Hx      SOCIAL HISTORY:  reports that he has never smoked. He has never used smokeless tobacco. He reports that he drinks alcohol.  The patient is married and retired.  He is a Passenger transport manager by background.   ALLERGIES: Methocarbamol   MEDICATIONS:  Current Outpatient Medications  Medication Sig Dispense Refill  . acetaminophen (TYLENOL) 500 MG tablet Take 1,000 mg by mouth every 6 (six) hours as needed for fever or headache.    Marland Kitchen acyclovir (ZOVIRAX) 400 MG tablet TAKE 1 TABLET(400 MG) BY MOUTH DAILY 30 tablet 3  . amLODipine (NORVASC) 5 MG tablet Take  1 tablet (5 mg total) by mouth daily. 90 tablet 3  . amoxicillin-clavulanate (AUGMENTIN XR) 1000-62.5 MG 12 hr tablet Take 2 tablets by mouth 2 (two) times daily. X 5 days 20 tablet 1  . aspirin 81 MG chewable tablet Chew 1 tablet (81 mg total) by mouth daily. 30 tablet 0  . Bortezomib (VELCADE IJ) Inject 1.75 mg as directed. On 3 weeks on 1 week off    . Dexamethasone (DECADRON PO) Take 40 mg by mouth. On 3 weeks off 1 week    . docusate sodium (COLACE) 100 MG capsule Take 100 mg by mouth 2 (two) times  daily.    Marland Kitchen HYDROcodone-acetaminophen (NORCO/VICODIN) 5-325 MG tablet Take 1-1.5 tablets by mouth every 4 (four) hours as needed for moderate pain. 60 tablet 0  . lenalidomide (REVLIMID) 10 MG capsule TAKE 1 CAPSULE BY MOUTH EVERY DAY FOR 14 DAYS ON THEN 7 DAYS OFF 14 capsule 0  . Polyethylene Glycol 3350 (MIRALAX PO) Take 17 g by mouth daily.    . prochlorperazine (COMPAZINE) 10 MG tablet Take 10 mg by mouth. On 3 weeks off 1 week    . Zoledronic Acid 4 MG SOLR Inject 4 mg into the vein every 3 (three) months.      No current facility-administered medications for this encounter.      REVIEW OF SYSTEMS: On review of systems, the patient reports that he is doing well however has been having persistent pain in his neck, and from his left posterior shoulder down his left arm. This does not extend to his fingertips. He denies weakenss, and denies any chest pain, shortness of breath, cough, fevers, chills, night sweats, unintended weight changes. He denies any bowel or bladder disturbances, and denies abdominal pain, nausea or vomiting. He denies any other new musculoskeletal or joint aches or pains. A complete review of systems is obtained and is otherwise negative.     PHYSICAL EXAM:  Wt Readings from Last 3 Encounters:  10/15/18 191 lb 3.2 oz (86.7 kg)  10/15/18 191 lb 8 oz (86.9 kg)  10/11/18 188 lb 9.6 oz (85.5 kg)   Temp Readings from Last 3 Encounters:  10/15/18 97.7 F (36.5 C) (Oral)  10/15/18 97.6 F (36.4 C) (Oral)  10/11/18 98.1 F (36.7 C) (Oral)   BP Readings from Last 3 Encounters:  10/15/18 (!) 131/91  10/15/18 (!) 148/99  10/11/18 132/90   Pulse Readings from Last 3 Encounters:  10/15/18 98  10/15/18 (!) 104  10/11/18 100   Pain Assessment Pain Score: 6 (Headache, neck, shoulder, left arm.  Arm being the worst area.) Pain Frequency: Constant Pain Loc: Arm(Left)/10  In general this is a well appearing caucasian male in no acute distress. He is alert and oriented  x4 and appropriate throughout the examination. HEENT reveals that the patient is normocephalic, atraumatic. EOMs are intact. Cardiopulmonary assessment is negative for acute distress and he exhibits normal effort. He appears to be neurologically intact without gross deficit.   ECOG = 1  0 - Asymptomatic (Fully active, able to carry on all predisease activities without restriction)  1 - Symptomatic but completely ambulatory (Restricted in physically strenuous activity but ambulatory and able to carry out work of a light or sedentary nature. For example, light housework, office work)  2 - Symptomatic, <50% in bed during the day (Ambulatory and capable of all self care but unable to carry out any work activities. Up and about more than 50% of waking hours)  3 -  Symptomatic, >50% in bed, but not bedbound (Capable of only limited self-care, confined to bed or chair 50% or more of waking hours)  4 - Bedbound (Completely disabled. Cannot carry on any self-care. Totally confined to bed or chair)  5 - Death   Eustace Pen MM, Creech RH, Tormey DC, et al. [phone]). "Toxicity and response criteria of the [facility]". [place] Oncol. 5 (6): 649-55    LABORATORY DATA:  Lab Results  Component Value Date   WBC 6.2 10/11/2018   HGB 12.5 (L) 10/11/2018   HCT 37.4 (L) 10/11/2018   MCV 96.1 10/11/2018   PLT 169 10/11/2018   Lab Results  Component Value Date   NA 138 10/11/2018   K 3.7 10/11/2018   CL 101 10/11/2018   CO2 24 10/11/2018   Lab Results  Component Value Date   ALT 32 10/11/2018   AST 22 10/11/2018   ALKPHOS 71 10/11/2018   BILITOT 0.6 10/11/2018      RADIOGRAPHY: Dg Chest 2 View  Result Date: 10/11/2018 CLINICAL DATA:  57 year old male with multiple myeloma and cough EXAM: CHEST - 2 VIEW COMPARISON:  01/23/2018 FINDINGS: Cardiomediastinal silhouette unchanged in size and contour. Low lung volumes. Patchy airspace opacity in the right lower lung. No  pneumothorax. No pleural effusion. No displaced fracture.  Degenerative changes. IMPRESSION: Low lung volumes, with airspace opacity in the right lower lung concerning for infection given the history. Followup PA and lateral chest X-ray is recommended in 3-4 weeks following treatment to assure resolution. Electronically Signed   By: Corrie Mckusick D.O.   On: 10/11/2018 16:42   Mr Cervical Spine Wo Contrast  Result Date: 10/11/2018 CLINICAL DATA:  Multiple myeloma in remission. Neck pain and arm weakness. EXAM: MRI CERVICAL SPINE WITHOUT CONTRAST TECHNIQUE: Multiplanar, multisequence MR imaging of the cervical spine was performed. No intravenous contrast was administered. COMPARISON:  Radiography 07/30/2018 FINDINGS: Alignment: Normal Vertebrae: Fluid intensity focus within the anterior central C4 vertebral body, most consistent with a previously treated myeloma lesion. The appearance is not presently suggestive of active tumor. There is abnormal marrow space signal of the C2 vertebra, worrisome for myeloma involvement. No evidence of fracture. There is also some involvement on the left at the upper C3 vertebral body. There could be some extraosseous tumor in that region, possibly involving the paraspinous soft tissues and foramen transversarium region. Cord: No cord compression or primary cord lesion. Posterior Fossa, vertebral arteries, paraspinal tissues: Some mucosal inflammatory changes of the sphenoid sinus. In the posterior portion of the sphenoid sinus, there is an abnormality measuring up to 3.3 cm in size that probably represents a chronic retention cyst with mucoid material. However, the possibility of a mass or polyp is not completely excluded based on these images. This could be evaluated at the time of postcontrast cervical imaging. Disc levels: Foramen magnum is widely patent. There is minimal non-compressive spondylosis at C2-3, C3-4 C4-5 and C5-6. At C6-7, there is a broad-based right  posterolateral predominant disc osteophyte complex that narrows the right side of the canal but does not compress the cord. Right foraminal narrowing could affect the exiting C7 nerve. C7-T1: Negative. IMPRESSION: Abnormal marrow signal of the C2 vertebral body worrisome for myeloma involvement. There is also some involvement of the left upper corner of the C3 vertebral body. Question some extraosseous tumor on the left, possibly with some involvement of the intervertebral foramen and transverse foramen. I would suggest postcontrast imaging to evaluate this further. Cystic area  in the C4 vertebral body most consistent with a previously treated myeloma lesion. Probable retention cyst in the sphenoid sinus. Mass or polyp not completely excluded. This could be evaluated at the time of postcontrast imaging. Right-sided predominant spondylosis at C6-7 with right foraminal narrowing due to osteophyte and disc material that could compress the right C7 nerve. Electronically Signed   By: Nelson Chimes M.D.   On: 10/11/2018 16:04   Ct Shoulder Left Wo Contrast  Result Date: 09/25/2018 CLINICAL DATA:  Left lateral neck and shoulder pain with difficulty raising arm. History of multiple myeloma. Evaluate for lytic lesion. EXAM: CT OF THE UPPER LEFT EXTREMITY WITHOUT CONTRAST TECHNIQUE: Multidetector CT imaging of the upper left extremity was performed according to the standard protocol. COMPARISON:  None. FINDINGS: Bones/Joint/Cartilage No focal bone lesion. No fracture or dislocation. Normal alignment. No joint effusion. Mild arthropathy of the glenohumeral and acromioclavicular joints. Type I acromion. Ligaments Ligaments are suboptimally evaluated by CT. Muscles and Tendons No muscle atrophy. The rotator cuff tendons are not well evaluated by CT. No large retracted tear. Soft tissue No fluid collection or hematoma. No soft tissue mass. The visualized left lung is clear. IMPRESSION: 1. No focal bone lesion. 2.  No acute  osseous abnormality. 3. Mild glenohumeral and acromioclavicular degenerative changes. Electronically Signed   By: Titus Dubin M.D.   On: 09/25/2018 10:30       IMPRESSION/PLAN: 1. Multiple Myeloma not having achieved remission. The patient was counseled on the findings in his cervical spine and would benefit from palliative radiotherapy particularly at the level of C2. Dr. Lisbeth Renshaw  discussed the risks, benefits, short, and long term effects of radiotherapy, and the patient is interested in proceeding. Dr. Lisbeth Renshaw discusses the delivery and logistics of radiotherapy and anticipates a course of 2 weeks of radiotherapy. Written consent is obtained and placed in the chart, a copy was provided to the patient. We will coordinate simulation for the patient and anticipate beginning treatment next week. He will also continue with Revlimid with Dr. Benay Spice.  2. Pain secondary to #1 and radiculopathy on the left along the C6 dermatome. We will review scans with neurosurgery to determine if there are any structural changes that account for his C6 symtoms. Regarding some of his upper neck and skull base pain, we would anticipate radiotherapy to reduce his symptoms. He will remain on his current regimen but will no longer continue steroids as these did not appear to alleviate his symptoms.  In a visit lasting 60 minutes, greater than 50% of the time was spent face to face discussing his case, and coordinating the patient's care.  The above documentation reflects my direct findings during this shared patient visit. Please see the separate note by Dr. Lisbeth Renshaw on this date for the remainder of the patient's plan of care.    Carola Rhine, PAC

## 2018-10-17 ENCOUNTER — Ambulatory Visit
Admission: RE | Admit: 2018-10-17 | Discharge: 2018-10-17 | Disposition: A | Discharge status: Home / Self Care | Payer: BLUE CROSS/BLUE SHIELD | Source: Ambulatory Visit | Attending: Radiation Oncology | Admitting: Radiation Oncology

## 2018-10-17 DIAGNOSIS — C9000 Multiple myeloma not having achieved remission: Secondary | ICD-10-CM | POA: Insufficient documentation

## 2018-10-18 ENCOUNTER — Emergency Department (HOSPITAL_COMMUNITY): Payer: BLUE CROSS/BLUE SHIELD

## 2018-10-18 ENCOUNTER — Other Ambulatory Visit: Payer: Self-pay

## 2018-10-18 ENCOUNTER — Observation Stay (HOSPITAL_COMMUNITY)
Admission: EM | Admit: 2018-10-18 | Discharge: 2018-10-19 | DRG: 841 | Disposition: A | Discharge status: Home / Self Care | Payer: BLUE CROSS/BLUE SHIELD | Attending: Internal Medicine | Admitting: Internal Medicine

## 2018-10-18 ENCOUNTER — Encounter (HOSPITAL_COMMUNITY): Payer: Self-pay

## 2018-10-18 DIAGNOSIS — Z833 Family history of diabetes mellitus: Secondary | ICD-10-CM

## 2018-10-18 DIAGNOSIS — D696 Thrombocytopenia, unspecified: Secondary | ICD-10-CM | POA: Diagnosis not present

## 2018-10-18 DIAGNOSIS — Z808 Family history of malignant neoplasm of other organs or systems: Secondary | ICD-10-CM

## 2018-10-18 DIAGNOSIS — E861 Hypovolemia: Secondary | ICD-10-CM | POA: Diagnosis present

## 2018-10-18 DIAGNOSIS — H538 Other visual disturbances: Secondary | ICD-10-CM | POA: Diagnosis not present

## 2018-10-18 DIAGNOSIS — D649 Anemia, unspecified: Secondary | ICD-10-CM

## 2018-10-18 DIAGNOSIS — Z66 Do not resuscitate: Secondary | ICD-10-CM | POA: Diagnosis not present

## 2018-10-18 DIAGNOSIS — M8458XA Pathological fracture in neoplastic disease, other specified site, initial encounter for fracture: Secondary | ICD-10-CM | POA: Diagnosis not present

## 2018-10-18 DIAGNOSIS — C9001 Multiple myeloma in remission: Principal | ICD-10-CM | POA: Diagnosis present

## 2018-10-18 DIAGNOSIS — R519 Headache, unspecified: Secondary | ICD-10-CM

## 2018-10-18 DIAGNOSIS — E781 Pure hyperglyceridemia: Secondary | ICD-10-CM | POA: Diagnosis not present

## 2018-10-18 DIAGNOSIS — Z8249 Family history of ischemic heart disease and other diseases of the circulatory system: Secondary | ICD-10-CM | POA: Diagnosis not present

## 2018-10-18 DIAGNOSIS — R29701 NIHSS score 1: Secondary | ICD-10-CM | POA: Diagnosis present

## 2018-10-18 DIAGNOSIS — Z7952 Long term (current) use of systemic steroids: Secondary | ICD-10-CM | POA: Diagnosis not present

## 2018-10-18 DIAGNOSIS — I10 Essential (primary) hypertension: Secondary | ICD-10-CM | POA: Diagnosis not present

## 2018-10-18 DIAGNOSIS — R200 Anesthesia of skin: Secondary | ICD-10-CM | POA: Diagnosis not present

## 2018-10-18 DIAGNOSIS — R51 Headache: Secondary | ICD-10-CM | POA: Diagnosis not present

## 2018-10-18 DIAGNOSIS — M25512 Pain in left shoulder: Secondary | ICD-10-CM

## 2018-10-18 DIAGNOSIS — J189 Pneumonia, unspecified organism: Secondary | ICD-10-CM | POA: Diagnosis not present

## 2018-10-18 DIAGNOSIS — E871 Hypo-osmolality and hyponatremia: Secondary | ICD-10-CM | POA: Diagnosis not present

## 2018-10-18 DIAGNOSIS — C9000 Multiple myeloma not having achieved remission: Secondary | ICD-10-CM | POA: Diagnosis not present

## 2018-10-18 DIAGNOSIS — Z7982 Long term (current) use of aspirin: Secondary | ICD-10-CM | POA: Diagnosis not present

## 2018-10-18 LAB — CBC WITH DIFFERENTIAL/PLATELET
Abs Immature Granulocytes: 0.76 10*3/uL — ABNORMAL HIGH (ref 0.00–0.07)
Basophils Absolute: 0 10*3/uL (ref 0.0–0.1)
Basophils Relative: 0 %
Eosinophils Absolute: 0.2 10*3/uL (ref 0.0–0.5)
Eosinophils Relative: 1 %
HCT: 38.9 % — ABNORMAL LOW (ref 39.0–52.0)
Hemoglobin: 13 g/dL (ref 13.0–17.0)
Immature Granulocytes: 7 %
Lymphocytes Relative: 6 %
Lymphs Abs: 0.6 10*3/uL — ABNORMAL LOW (ref 0.7–4.0)
MCH: 32.7 pg (ref 26.0–34.0)
MCHC: 33.4 g/dL (ref 30.0–36.0)
MCV: 97.7 fL (ref 80.0–100.0)
Monocytes Absolute: 0.9 10*3/uL (ref 0.1–1.0)
Monocytes Relative: 8 %
NEUTROS ABS: 8.7 10*3/uL — AB (ref 1.7–7.7)
NEUTROS PCT: 78 %
Platelets: 210 10*3/uL (ref 150–400)
RBC: 3.98 MIL/uL — ABNORMAL LOW (ref 4.22–5.81)
RDW: 15.7 % — AB (ref 11.5–15.5)
WBC: 11.2 10*3/uL — ABNORMAL HIGH (ref 4.0–10.5)
nRBC: 0 % (ref 0.0–0.2)

## 2018-10-18 LAB — COMPREHENSIVE METABOLIC PANEL
ALT: 37 U/L (ref 0–44)
AST: 24 U/L (ref 15–41)
Albumin: 3.5 g/dL (ref 3.5–5.0)
Alkaline Phosphatase: 65 U/L (ref 38–126)
Anion gap: 10 (ref 5–15)
BUN: 22 mg/dL — ABNORMAL HIGH (ref 6–20)
CHLORIDE: 97 mmol/L — AB (ref 98–111)
CO2: 25 mmol/L (ref 22–32)
Calcium: 8.7 mg/dL — ABNORMAL LOW (ref 8.9–10.3)
Creatinine, Ser: 0.91 mg/dL (ref 0.61–1.24)
GFR calc Af Amer: >60 mL/min (ref >60)
GFR calc non Af Amer: >60 mL/min (ref >60)
Glucose, Bld: 119 mg/dL — ABNORMAL HIGH (ref 70–99)
POTASSIUM: 3.5 mmol/L (ref 3.5–5.1)
Sodium: 132 mmol/L — ABNORMAL LOW (ref 135–145)
Total Bilirubin: 0.5 mg/dL (ref 0.3–1.2)
Total Protein: 6 g/dL — ABNORMAL LOW (ref 6.5–8.1)

## 2018-10-18 LAB — PROTEIN ELECTROPHORESIS, SERUM
A/G Ratio: 1.2 (ref 0.7–1.7)
ALBUMIN ELP: 3.2 g/dL (ref 2.9–4.4)
ALPHA-1-GLOBULIN: 0.5 g/dL — AB (ref 0.0–0.4)
ALPHA-2-GLOBULIN: 1 g/dL (ref 0.4–1.0)
Beta Globulin: 0.9 g/dL (ref 0.7–1.3)
GLOBULIN, TOTAL: 2.7 g/dL (ref 2.2–3.9)
Gamma Globulin: 0.3 g/dL — ABNORMAL LOW (ref 0.4–1.8)
Total Protein ELP: 5.9 g/dL — ABNORMAL LOW (ref 6.0–8.5)

## 2018-10-18 LAB — C-REACTIVE PROTEIN: CRP: 5.1 mg/dL — ABNORMAL HIGH (ref <1.0)

## 2018-10-18 LAB — SEDIMENTATION RATE: Sed Rate: 37 mm/hr — ABNORMAL HIGH (ref 0–16)

## 2018-10-18 MED ORDER — SODIUM CHLORIDE 0.9% FLUSH
3.0000 mL | Freq: Two times a day (BID) | INTRAVENOUS | Status: DC
  Administered 2018-10-18: 3 mL via INTRAVENOUS

## 2018-10-18 MED ORDER — KETOROLAC TROMETHAMINE 30 MG/ML IJ SOLN
30.0000 mg | Freq: Once | INTRAMUSCULAR | Status: AC
  Administered 2018-10-18: 30 mg via INTRAVENOUS
  Filled 2018-10-18: qty 1

## 2018-10-18 MED ORDER — SODIUM CHLORIDE 0.9% FLUSH: 3.0000 mL | INTRAVENOUS | Status: DC | PRN

## 2018-10-18 MED ORDER — GADOBUTROL 1 MMOL/ML IV SOLN
10.0000 mL | Freq: Once | INTRAVENOUS | Status: AC | PRN
  Administered 2018-10-18: 9 mL via INTRAVENOUS

## 2018-10-18 MED ORDER — DIPHENHYDRAMINE HCL 50 MG/ML IJ SOLN
25.0000 mg | Freq: Once | INTRAMUSCULAR | Status: AC
  Administered 2018-10-18: 25 mg via INTRAVENOUS
  Filled 2018-10-18: qty 1

## 2018-10-18 MED ORDER — SODIUM CHLORIDE 0.9 % IV SOLN: 250.0000 mL | INTRAVENOUS | Status: DC | PRN

## 2018-10-18 MED ORDER — OXYCODONE HCL 5 MG PO TABS
5.0000 mg | ORAL_TABLET | Freq: Four times a day (QID) | ORAL | Status: DC | PRN
  Administered 2018-10-18: 5 mg via ORAL
  Filled 2018-10-18: qty 1

## 2018-10-18 MED ORDER — ACYCLOVIR 400 MG PO TABS
400.0000 mg | ORAL_TABLET | Freq: Every day | ORAL | Status: DC
  Filled 2018-10-18: qty 1

## 2018-10-18 MED ORDER — AMLODIPINE BESYLATE 5 MG PO TABS
5.0000 mg | ORAL_TABLET | Freq: Every day | ORAL | Status: DC
  Administered 2018-10-19: 5 mg via ORAL
  Filled 2018-10-18: qty 1

## 2018-10-18 MED ORDER — METOCLOPRAMIDE HCL 5 MG/ML IJ SOLN
10.0000 mg | Freq: Once | INTRAMUSCULAR | Status: AC
  Administered 2018-10-18: 10 mg via INTRAVENOUS
  Filled 2018-10-18: qty 2

## 2018-10-18 MED ORDER — SODIUM CHLORIDE 0.9 % IV BOLUS
1000.0000 mL | Freq: Once | INTRAVENOUS | Status: AC
  Administered 2018-10-18: 1000 mL via INTRAVENOUS

## 2018-10-18 NOTE — Progress Notes (Signed)
Called to speak to RN to screen patient for MRI, no answer at ED front desk   8:24  Called back a second time to speak to RN about getting patient for MRI, no one answered phone 9:28

## 2018-10-18 NOTE — ED Notes (Signed)
Teleneuro called RN in room waiting for call

## 2018-10-18 NOTE — ED Notes (Signed)
Patient back from MRI.

## 2018-10-18 NOTE — ED Notes (Signed)
Provider at bedside to perform LP.  Pt, provider have signed paper consent, placed in pt chart.  Consent witnessed by this RN.

## 2018-10-18 NOTE — ED Notes (Signed)
Patient transported to MRI 

## 2018-10-18 NOTE — H&P (Signed)
History and Physical    Paul Green ASN:[id number] DOB: 03-27-1961 DOA: 10/18/2018  PCP: [name], MD  Patient coming from: home   Chief Complaint: facial numbness  HPI: Paul Green is a 57 y.o. male with medical history significant for htn and multiple myeloma diagnosed earlier this year, presenting with above.  Recently treated for bronchitis, says breathing symptoms essentially resolved. Has left shoulder pain chronic for the past several months, it has not worsened.  Beginning about 24 hours ago patient began experiencing insidious onset of left facial numbness. Not total numbness but feels quite numb. Upper and lower face. No difficulty swallowing or speaking. No facial droop. No other right sided symptoms - no weakness or numbness, no difficulty ambulating. Here in ED has also began experiencing double vision. Has also had mild headache, now improved. No neck pain or stiffness. Denies fever. Otherwise feeling well. No rashes. This has not happened before.  ED Course: head/neck imaging, attempted LP, neuro and ID consults.  Review of Systems: As per HPI otherwise 10 point review of systems negative.    Past Medical History:  Diagnosis Date  . Multiple myeloma ([place]) 01/2018    Past Surgical History:  Procedure Laterality Date  . VASECTOMY  1985     reports that he has never smoked. He has never used smokeless tobacco. He reports current alcohol use. He reports that he does not use drugs.  Allergies  Allergen Reactions  . Methocarbamol Other (See Comments)    Caused headaches    Family History  Problem Relation Age of Onset  . Skin cancer Mother   . Atrial fibrillation Mother   . Atrial fibrillation Father   . Melanoma Father   . Diabetes Sister   . Melanoma Brother   . Stroke Other   . CAD Neg Hx     Prior to Admission medications   Medication Sig Start Date End Date Taking? Authorizing Provider  acetaminophen (TYLENOL) 500 MG tablet Take 1,000 mg  by mouth every 6 (six) hours as needed for fever or headache.   Yes [provider]  acyclovir (ZOVIRAX) 400 MG tablet TAKE 1 TABLET(400 MG) BY MOUTH DAILY Patient taking differently: Take 400 mg by mouth daily. TAKE 1 TABLET(400 MG) BY MOUTH DAILY 07/16/18  Yes Owens Shark, NP  amLODipine (NORVASC) 5 MG tablet Take 1 tablet (5 mg total) by mouth daily. 07/29/18 10/27/18 Yes Buford Dresser, MD  aspirin 81 MG chewable tablet Chew 1 tablet (81 mg total) by mouth daily. 01/27/18  Yes Mariel Aloe, MD  Bortezomib (VELCADE IJ) Inject 1.75 mg as directed. On 3 weeks on 1 week off   Yes [provider]  Dexamethasone (DECADRON PO) Take 40 mg by mouth once. On 3 weeks off 1 week    Yes [provider]  docusate sodium (COLACE) 100 MG capsule Take 100 mg by mouth 2 (two) times daily.   Yes [provider]  lenalidomide (REVLIMID) 10 MG capsule TAKE 1 CAPSULE BY MOUTH EVERY DAY FOR 14 DAYS ON THEN 7 DAYS OFF Patient taking differently: Take 10 mg by mouth as directed. TAKE 1 CAPSULE BY MOUTH EVERY DAY FOR 14 DAYS ON THEN 7 DAYS OFF 10/01/18  Yes [name], MD  oxyCODONE (OXY IR/ROXICODONE) 5 MG immediate release tablet Take 5 mg by mouth every 6 (six) hours as needed for severe pain.   Yes [provider]  Polyethylene Glycol 3350 (MIRALAX PO) Take 17 g by mouth daily.  Yes [provider]  prochlorperazine (COMPAZINE) 10 MG tablet Take 10 mg by mouth. On 3 weeks off 1 week   Yes [provider]  Zoledronic Acid 4 MG SOLR Inject 4 mg into the vein every 3 (three) months.    Yes [provider]  HYDROcodone-acetaminophen (NORCO/VICODIN) 5-325 MG tablet Take 1-1.5 tablets by mouth every 4 (four) hours as needed for moderate pain. 10/11/18   [name], MD    Physical Exam: Vitals:   10/18/18 1457 10/18/18 1700 10/18/18 1900 10/18/18 2000  BP: 118/85 (!) 128/98 130/88 (!) 121/94  Pulse: 83 100 94 (!) 119  Resp:  '16  16 17  ' Temp:      TempSrc:      SpO2: 95% 96% 95% 92%  Weight:      Height:        Constitutional: No acute distress Head: Atraumatic Eyes: Conjunctiva clear ENM: Moist mucous membranes. Normal dentition.  Neck: Supple Respiratory: Clear to auscultation bilaterally, no wheezing/rales/rhonchi. Normal respiratory effort. No accessory muscle use. . Cardiovascular: Regular rate and rhythm. No murmurs/rubs/gallops. Abdomen: Non-tender, non-distended. No masses. No rebound or guarding. Positive bowel sounds. Musculoskeletal: No joint deformity upper and lower extremities. Normal ROM, no contractures. ttp left shoulder Skin: No rashes, lesions, or ulcers.  Extremities: No peripheral edema. Palpable peripheral pulses. Neurologic: Alert, moving all 4 extremities. 5/5 upper and lower strength. Left facial numbness to light touch, cn2-12 otherwise grossly intact Psychiatric: Normal insight and judgement.   Labs on Admission: I have personally reviewed following labs and imaging studies  CBC: Recent Labs  Lab 10/18/18 0820  WBC 11.2*  NEUTROABS 8.7*  HGB 13.0  HCT 38.9*  MCV 97.7  PLT 569   Basic Metabolic Panel: Recent Labs  Lab 10/18/18 0820  NA 132*  K 3.5  CL 97*  CO2 25  GLUCOSE 119*  BUN 22*  CREATININE 0.91  CALCIUM 8.7*   GFR: Estimated Creatinine Clearance: 91.1 mL/min (by C-G formula based on SCr of 0.91 mg/dL). Liver Function Tests: Recent Labs  Lab 10/18/18 0820  AST 24  ALT 37  ALKPHOS 65  BILITOT 0.5  PROT 6.0*  ALBUMIN 3.5   No results for input(s): LIPASE, AMYLASE in the last 168 hours. No results for input(s): AMMONIA in the last 168 hours. Coagulation Profile: No results for input(s): INR, PROTIME in the last 168 hours. Cardiac Enzymes: No results for input(s): CKTOTAL, CKMB, CKMBINDEX, TROPONINI in the last 168 hours. BNP (last 3 results) No results for input(s): PROBNP in the last 8760 hours. HbA1C: No results for input(s): HGBA1C in  the last 72 hours. CBG: No results for input(s): GLUCAP in the last 168 hours. Lipid Profile: No results for input(s): CHOL, HDL, LDLCALC, TRIG, CHOLHDL, LDLDIRECT in the last 72 hours. Thyroid Function Tests: No results for input(s): TSH, T4TOTAL, FREET4, T3FREE, THYROIDAB in the last 72 hours. Anemia Panel: No results for input(s): VITAMINB12, FOLATE, FERRITIN, TIBC, IRON, RETICCTPCT in the last 72 hours. Urine analysis:    Component Value Date/Time   COLORURINE YELLOW 01/23/2018 1903   APPEARANCEUR HAZY (A) 01/23/2018 1903   LABSPEC 1.015 01/23/2018 1903   PHURINE 5.0 01/23/2018 1903   GLUCOSEU NEGATIVE 01/23/2018 1903   HGBUR SMALL (A) 01/23/2018 1903   BILIRUBINUR NEGATIVE 01/23/2018 1903   KETONESUR NEGATIVE 01/23/2018 1903   PROTEINUR NEGATIVE 01/23/2018 1903   NITRITE NEGATIVE 01/23/2018 1903   LEUKOCYTESUR NEGATIVE 01/23/2018 1903    Radiological Exams on Admission: Mr Brain Wo Contrast  Result Date: 10/18/2018 CLINICAL DATA:  Altered level of consciousness.  Multiple myeloma EXAM: MRI HEAD WITHOUT CONTRAST TECHNIQUE: Multiplanar, multiecho pulse sequences of the brain and surrounding structures were obtained without intravenous contrast. COMPARISON:  MRI cervical spine 10/11/2018 FINDINGS: Brain: Mild atrophy. Negative for hydrocephalus. Several small white matter hyper to intensities are present which appear chronic. No acute infarct. Negative for hemorrhage or mass lesion in the brain. Vascular: Normal arterial flow voids Skull and upper cervical spine: Abnormal C2 vertebral body as noted previously suspicious for multiple myeloma. No skull lesions identified. Sinuses/Orbits: Complete opacification sphenoid sinus. Remaining sinuses clear. 12 mm mass in the superior orbit on the right. This appears extra conal and is located superior to the muscles and slightly lateral. This is well-circumscribed with muscle signal intensity on T1 and T2. Probable incidental lesion such as a  dermoid. Other: None IMPRESSION: Mild chronic microvascular ischemic change in the white matter. No acute infarct. Abnormal bone marrow at C2 suggestive of myeloma.  No skull lesion Complete opacification sphenoid sinus presumably from obstruction and inflammatory disease. No bony destruction. 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   On: 10/18/2018 12:04   Mr Brain W Contrast  Result Date: 10/18/2018 CLINICAL DATA:  Right facial numbness. Concern for myeloma involvement of the C2 vertebral body based on prior MRI. EXAM: MRI HEAD WITH CONTRAST TECHNIQUE: Multiplanar, multiecho pulse sequences of the brain and surrounding structures were obtained with intravenous contrast. CONTRAST:  9 mL Gadavist COMPARISON:  Brain MRI 10/18/2018 FINDINGS: Postcontrast imaging was performed for further evaluation of earlier findings from the noncontrast MRI. There is no abnormal intraparenchymal contrast enhancement within the brain. The postcontrast T1-weighted signal intensity of the C2 vertebral body is isointense to the remainder of the bone marrow. No significant contrast enhancement of the superior right orbital lesion. IMPRESSION: No hyperenhancement of the C2 vertebral body relative to the other bone marrow, though this does not entirely exclude marrow replacement disorders. For future exams, this area is better characterized on cervical spine imaging. Electronically Signed   By: Ulyses Jarred M.D.   On: 10/18/2018 18:03     Assessment/Plan Principal Problem:   Right facial numbness Active Problems:   Multiple myeloma ([facility])   Essential hypertension   Hyponatremia   Left shoulder pain   # Right facial numbness - etiology unclear but concern for neoplastic process. Unfortunately LP not successful in ED. MRI does not show an acute process. Neurology and oncology both consulted; oncology thinks unlikely side effect from  antineoplastics. No infectious symptoms so low concern at this point for infectious process.  - will need IR lumbar puncture in AM - onc recommends holding lenalidomide on hold for now - likely repeat MRI with thin cuts in AM if focal neurologic symptoms persist - appreciate onc and neuro recs - consider ophtho consult if diplopia persists or other vision changes present  # multiple myeloma - will be due for bortezomib and dexamethasone this coming Tuesday; lenalidomide on hold as above - oxycodone prn  # hyponatremia - mild, 132, quite possibly siadh from neoplastic/intracranial process - urine osm, sodium - am bmp  DVT prophylaxis: scds given IR procedure in AM Code Status: dnr, confirmed w/ pt  Family Communication: wife maureen [phone]  Disposition Plan: tbd  Consults called: neuro, oncology  Admission status: med/surg    Desma Maxim MD [facility] Pager [phone]  If 7PM-7AM, please contact  night-coverage [email or web address] Password [facility]  10/18/2018, 8:32 PM

## 2018-10-18 NOTE — ED Notes (Signed)
Pt has unequal pupils rt side larger. Pt also reports that he has increased rt side facial numbness. Dr. Roderic Palau has been made aware.

## 2018-10-18 NOTE — Progress Notes (Signed)
IP PROGRESS NOTE  Subjective:   Paul Green is well-known to me with a history of multiple myeloma.  He is currently being treated with RVD therapy with the most recent cycle initiated on 10/15/2018. He complains of pain in the left shoulder for the past month.  The pain was severe on 10/11/2018.  An MRI of the cervical spine revealed a lesion at C2 consistent with a myeloma lesion.  This lesion was felt to potentially explain the left shoulder pain.  He saw Dr. Lisbeth Renshaw and is scheduled to begin palliative radiation on 10/21/2018. He presents to the emergency room today with new onset right face and tongue numbness.  No weakness.  No difficulty swallowing.  He has noted the onset of blurred vision and a retro-orbital headache since presenting to the emergency room.  No diplopia. A noncontrast brain MRI today revealed opacification of the sphenoid sinus.  A 12 mm circumscribed mass was noted in the superior orbit on the right and is felt to likely represent a dermoid. He was treated for pneumonia last week.  Fever and cough have resolved. Objective: Vital signs in last 24 hours: Blood pressure 118/85, pulse 83, temperature 98.4 F (36.9 C), temperature source Oral, resp. rate 16, height '5\' 6"'  (1.676 m), weight 190 lb (86.2 kg), SpO2 95 %.  Intake/Output from previous day: No intake/output data recorded.  Physical Exam:  HEENT: No thrush Lungs: End inspiratory rhonchi at the left posterior base, no respiratory distress Cardiac: Regular rate and rhythm Abdomen: No hepatosplenomegaly, nontender Extremities: No leg edema Neurologic: Alert and oriented, the motor exam appears intact in the upper and lower extremities bilaterally except for slight weakness with abduction at the left shoulder.  Decreased sensation to light touch at the right face.  The face is symmetric.  The extraocular movements appear intact.  The left pupil is larger than the right side.  The pupils are reactive to light.    Lab  Results: Recent Labs    10/18/18 0820  WBC 11.2*  HGB 13.0  HCT 38.9*  PLT 210    BMET Recent Labs    10/18/18 0820  NA 132*  K 3.5  CL 97*  CO2 25  GLUCOSE 119*  BUN 22*  CREATININE 0.91  CALCIUM 8.7*    No results found for: CEA1  Studies/Results: Mr Brain Wo Contrast  Result Date: 10/18/2018 CLINICAL DATA:  Altered level of consciousness.  Multiple myeloma EXAM: MRI HEAD WITHOUT CONTRAST TECHNIQUE: Multiplanar, multiecho pulse sequences of the brain and surrounding structures were obtained without intravenous contrast. COMPARISON:  MRI cervical spine 10/11/2018 FINDINGS: Brain: Mild atrophy. Negative for hydrocephalus. Several small white matter hyper to intensities are present which appear chronic. No acute infarct. Negative for hemorrhage or mass lesion in the brain. Vascular: Normal arterial flow voids Skull and upper cervical spine: Abnormal C2 vertebral body as noted previously suspicious for multiple myeloma. No skull lesions identified. Sinuses/Orbits: Complete opacification sphenoid sinus. Remaining sinuses clear. 12 mm mass in the superior orbit on the right. This appears extra conal and is located superior to the muscles and slightly lateral. This is well-circumscribed with muscle signal intensity on T1 and T2. Probable incidental lesion such as a dermoid. Other: None IMPRESSION: Mild chronic microvascular ischemic change in the white matter. No acute infarct. Abnormal bone marrow at C2 suggestive of myeloma.  No skull lesion Complete opacification sphenoid sinus presumably from obstruction and inflammatory disease. No bony destruction. 12 x 5 mm well-circumscribed mass right superior orbit  likely dermoid Electronically Signed   By: Franchot Gallo M.D.   On: 10/18/2018 12:04    Medications: I have reviewed the patient's current medications.  Assessment/Plan: 1. Multiple myeloma   Anemia/thrombocytopenia  Bone marrow biopsy 01/25/2018-hypercellular bone marrow with  extensive involvement by plasma cell neoplasm; flow cytometry with a population of cells with plasmacytic differentiation (13%), no monoclonal B-cell population identified, T cells with nonspecific changes.  Cytogenetics-multiple rearrangements, FISH panel negative for cytogenetic abnormalities to be associated with multiple myeloma including loss of p53  01/24/2018 SPEP-noM spike,elevated serum lambda free light chains (432)  Dexamethasone 40 mg daily times 4 days beginning 01/25/2018  Velcade/dexamethasone weekly x3 followed by a 1 week break beginning3/26/2019(weekly dexamethasone initiated 02/05/2018, Revlimid initiated 02/09/2018)  Lambda light chains improved 02/26/2018  Cycle 2 RVD 03/05/2018 (Velcade dose reduced due to neutropenia), day 8 Velcade held and Revlimid discontinued secondary to recurrent severe neutropenia;day 15 Revlimid resumed x1 week, Velcade held;  Revlimid and Velcade placed on hold 04/02/2018 due to persistent neutropenia.  Bone marrow biopsy at [facility] 04/18/2018-hypercellular marrow, no evidence of myeloma  Cycle 3 RVD started 04/23/2018, Revlimid 5 mg dailyfor 14 days beginning 05/01/2018  Cycle 4 RVD 05/22/2018,Revlimid 5 mg daily for 14 days beginning 05/29/2018  Cycle 5 RVD 06/18/2018, Velcade escalated to standard dosing, Revlimid increased to 10 mg  Cycle 6 RVD 9/10/2019but discontinued after day 1 Velcade  St[facility] cell harvest at [facility] 08/12/2018  Cycle 7 RVD 08/20/2018  Cycle 8 RVD 09/17/2018, day 15 therapy held secondary to patient vacation 2. Hypercalcemia status post pamidronate 01/24/2018, resolved 3. Diffuse abnormal bone marrow signal, compression fracture of L1 4. Back painsecondary to multiple myeloma, compression fracture.Improved. 5. Anorexia/weight loss-improved 6. 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 Severe neutropenia-recurrent after Revlimid resumed 03/19/2018, resolved 8. Bilateral shoulder pain, neck  pain, left arm weakness 10/11/2018-MRI cervical spine- abnormal marrow signal at C2 and upper corner of C3, involvement of the transverse and intervertebral foramen, treated myeloma lesion at C4 9. Pneumonia 10/11/2018-treated with Augmentin and azithromycin 10. 10/18/2018-headache, right face and tongue numbness, visual disturbance   Paul Green has multiple myeloma.  He presents today with multiple neurologic symptoms.  I am concerned he has a CNS process, potentially related to the myeloma.  He had plasmablastic changes on the initial diagnostic bone marrow.  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  I doubt his symptoms are related to toxicity from lenalidomide or bortezomib.  Recommendations: 1.  Place lenalidomide on hold 2.  Neurology consult to consider lumbar puncture and contrast brain MRI 3.  Oncology will see him 14 2019      LOS: 0 days   Betsy Coder, MD   10/18/2018, 4:47 PM

## 2018-10-18 NOTE — ED Triage Notes (Addendum)
Pt has hx of multiple myeloma. Pt states that the right side of his face has been numb since yesterday. Pt states that he has a headache behind his right eye as well. Pt states he is also having left arm numbness and pain. Pt had a recent MRI (last night), which showed lesions on the c spine. Pt is due to start radiation on Monday. Pt is currently doing velcade injections, as well as revlimid.

## 2018-10-18 NOTE — ED Provider Notes (Signed)
4:31 PM Care assumed from Dr. Zammit.  At time of transfer care, patient is awaiting recommendations by neurology and oncology.  Patient has history of multiple myeloma and had onset of neurologic deficits with right-sided facial numbness and anisocoria with larger pupil on the right and vision changes.  Tele-neurology just called reporting that patient will need MRI with contrast to go with the MRI without contrast he received earlier.  He also recommends LP as well as admission to medicine.  The oncology team agreed with the plan of care they discussed with neurology.  They are concerned about a metastatic or carcinomatous meningeal process.  Hospitalist team will be called for admission for further management.  4:50 PM Patient will be taken for his MRI right now.  He requested a headache cocktail as his headache was a 5 out of 10.  This was ordered.  When patient returns, will obtain LP and admit patient.  7:34 PM LP was attempted several minutes ago and unfortunately, I was unable to get CSF out successfully.  Patient says that he has had a history of compression fractures related to his multiple myeloma, this may have contributed to difficulty.  He denied significant back pain and had no neurologic deficits after the procedure.  Patient will be admitted for further management.  He will likely need LP under imaging guidance.  7:48 PM Patient reports he is now having diplopia.  Headache has improved after headache cocktail.  Patient will be admitted.  7:59 PM  Neuro recs repeat MRI w/o brain in the AM with thin cuts.  They agree with LP under IR guidance in the AM.  They recommended ESR and CRP added on.  Hospitalist team called for admission.      ,  J, MD 10/18/18 2352  

## 2018-10-18 NOTE — Consult Note (Signed)
[facility]   Asked to see this patient in telemedicine consultation. Consultation was performed with assistance of ancillary / medical staff at bedside. ?   Verbal consent to perform the examination with telemedicine was obtained. Patient and family agreed to proceed with the consultation.   Impression: Facial numbness  R side face numbness. 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  Recommendations:    MRI brain with contrast  LP to eval for carcinomatous meningeal process  Consider ophtho eval  Discussed with ED MD     CC:  Stroke Alert  Date of Consult:  10/18/18    HPI:  MM diagnosis last year with complete remission  Over the past month progressive L shoulder pain with negative imaging  C spine MRI showed C2 lesion --> XRT planned  Today numbness R face and tongue in addition to blurred vision OD only  With the onset of symptoms yesterday, pain behind OD that is not affected by eye movement  Onset was yesterday afternoon with mild symptoms and evolving some over the last 24 hours  NO weakness associated  No CN deficits in past  Denies any diplopa, dyphagia, dysarthria   Past Medical History:  Diagnosis Date  . Multiple myeloma ([place]) 01/2018    Prior to Admission medications   Medication Sig Start Date End Date Taking? Authorizing Provider  acetaminophen (TYLENOL) 500 MG tablet Take 1,000 mg by mouth every 6 (six) hours as needed for fever or headache.   Yes [provider]  acyclovir (ZOVIRAX) 400 MG tablet TAKE 1 TABLET(400 MG) BY MOUTH DAILY Patient taking differently: Take 400 mg by mouth daily. TAKE 1 TABLET(400 MG) BY MOUTH DAILY 07/16/18  Yes Owens Shark, NP  amLODipine (NORVASC) 5 MG tablet Take 1 tablet (5 mg total) by mouth daily. 07/29/18 10/27/18 Yes Buford Dresser, MD  aspirin 81 MG chewable tablet Chew 1 tablet (81 mg total) by mouth daily. 01/27/18  Yes Mariel Aloe, MD  Bortezomib (VELCADE IJ) Inject 1.75 mg as directed. On 3 weeks on 1 week off   Yes [provider]  Dexamethasone (DECADRON PO) Take 40 mg by mouth once. On 3 weeks off 1 week    Yes [provider]  docusate sodium (COLACE) 100 MG capsule Take 100 mg by mouth 2 (two) times daily.   Yes [provider]  lenalidomide (REVLIMID) 10 MG capsule TAKE 1 CAPSULE BY MOUTH EVERY DAY FOR 14 DAYS ON THEN 7 DAYS OFF Patient taking differently: Take 10 mg by mouth as directed. TAKE 1 CAPSULE BY MOUTH EVERY DAY FOR 14 DAYS ON THEN 7 DAYS OFF 10/01/18  Yes [name], MD  oxyCODONE (OXY IR/ROXICODONE) 5 MG immediate release tablet Take 5 mg by mouth every 6 (six) hours as needed for severe pain.   Yes [provider]  Polyethylene Glycol 3350 (MIRALAX PO) Take 17 g by mouth daily.   Yes [provider]  prochlorperazine (COMPAZINE) 10 MG tablet Take 10 mg by mouth. On 3 weeks off 1 week   Yes [provider]  Zoledronic Acid 4 MG SOLR Inject 4 mg into the vein every 3 (three) months.    Yes [provider]  amoxicillin-clavulanate (AUGMENTIN XR) 1000-62.5 MG 12 hr tablet Take 2 tablets by mouth 2 (two) times daily. X 5 days 10/11/18  [name], MD  HYDROcodone-acetaminophen (NORCO/VICODIN) 5-325 MG tablet Take 1-1.5 tablets by mouth every 4 (four) hours as needed for moderate pain. 10/11/18   [name], MD     Allergies  Allergen Reactions  . Methocarbamol Other (See Comments)    Caused headaches    Social History   Tobacco Use  . Smoking status: Never Smoker  . Smokeless tobacco: Never Used  Substance Use Topics  . Alcohol use: Yes    Comment: occ  . Drug use: Never    Family History  Problem Relation Age of Onset  . Skin cancer Mother   . Atrial fibrillation Mother   . Atrial fibrillation  Father   . Melanoma Father   . Diabetes Sister   . Melanoma Brother   . Stroke Other   . CAD Neg Hx         Physical Exam  Vitals:   10/18/18 1430 10/18/18 1457  BP: (!) 176/87 118/85  Pulse: (!) 104 83  Resp:  16  Temp:    SpO2: 100% 95%     NIHSS  LOC - 0  LOC questions - 0  LOC commands - 0  EOM - 0  VF - 0  Face - 0  Motor Upper Ext - 0 (chr issues with left shoulder)  Motor Lower Ext - 0  Coordination - 0  Sensory - 1  Language - 0  Speech - 0  Extinction - 0  NIHSS total - 1   Lab/Data Review  MRI brain - reviewed - no acute stroke, WM disease, no obvious lesions      Medical Decision Making:  - Extensive number of diagnosis or management options are considered above.   - Extensive amount of complex data reviewed.   - High risk of complication and/or morbidity or mortality are associated with differential diagnostic considerations above.  - There may be Uncertain outcome and increased probability of prolonged functional impairment or high probability of severe prolonged functional impairment associated with some of these differential diagnosis.  Medical Data Reviewed:  1.Data reviewed include clinical labs, radiology,  Medical Tests;   2.Tests results discussed w/performing or interpreting physician;   3.Obtaining/reviewing old medical records;  4.Obtaining case history from another source;  5.Independent review of image, tracing or specimen.

## 2018-10-18 NOTE — Progress Notes (Signed)
[facility] at [phone] spoke to [place]   Was placed on hold no one came to phone    Called a 4th time to reach RN or Agricultural consultant, no answer.  10:10am

## 2018-10-18 NOTE — ED Notes (Addendum)
Confirmed with hospitalist Dr. Si Raider, this patient is fine off telemetry.

## 2018-10-18 NOTE — ED Provider Notes (Signed)
[facility] DEPT Provider Note   CSN: [id number] Arrival date & time: 10/18/18  0734     History   Chief Complaint Chief Complaint  Patient presents with  . Numbness    HPI Paul Green is a 57 y.o. male.  Pt has myeloma.  Pt presents with 24 hours of numbess to left face and tongue.,  Patient also complains of headache  The history is provided by the patient. No language interpreter was used.  Illness  This is a new problem. The current episode started 12 to 24 hours ago. The problem occurs constantly. The problem has not changed since onset.Pertinent negatives include no chest pain, no abdominal pain and no headaches. Nothing relieves the symptoms. He has tried nothing for the symptoms. The treatment provided no relief.    Past Medical History:  Diagnosis Date  . Multiple myeloma ([place]) 01/2018    Patient Active Problem List   Diagnosis Date Noted  . Hypertriglyceridemia 05/30/2018  . Metabolic syndrome 22/48/2500  . Essential hypertension 05/30/2018  . Allergic rhinitis 02/18/2018  . Goals of care, counseling/discussion 01/29/2018  . Multiple myeloma ([place]) 01/28/2018  . Multiple myeloma not having achieved remission ([place]) 01/28/2018  . Hypercalcemia 01/23/2018  . AKI (acute kidney injury) ([place]) 01/23/2018  . Back pain 01/23/2018  . Closed compression fracture of L1 lumbar vertebra 01/23/2018    Past Surgical History:  Procedure Laterality Date  . [place] Medications    Prior to Admission medications   Medication Sig Start Date End Date Taking? Authorizing Provider  acetaminophen (TYLENOL) 500 MG tablet Take 1,000 mg by mouth every 6 (six) hours as needed for fever or headache.   Yes [provider]  acyclovir (ZOVIRAX) 400 MG tablet TAKE 1 TABLET(400 MG) BY MOUTH DAILY Patient taking differently: Take 400 mg by mouth daily. TAKE 1 TABLET(400 MG) BY MOUTH DAILY 07/16/18  Yes Owens Shark, NP    amLODipine (NORVASC) 5 MG tablet Take 1 tablet (5 mg total) by mouth daily. 07/29/18 10/27/18 Yes Buford Dresser, MD  aspirin 81 MG chewable tablet Chew 1 tablet (81 mg total) by mouth daily. 01/27/18  Yes Mariel Aloe, MD  Bortezomib (VELCADE IJ) Inject 1.75 mg as directed. On 3 weeks on 1 week off   Yes [provider]  Dexamethasone (DECADRON PO) Take 40 mg by mouth once. On 3 weeks off 1 week    Yes [provider]  docusate sodium (COLACE) 100 MG capsule Take 100 mg by mouth 2 (two) times daily.   Yes [provider]  lenalidomide (REVLIMID) 10 MG capsule TAKE 1 CAPSULE BY MOUTH EVERY DAY FOR 14 DAYS ON THEN 7 DAYS OFF Patient taking differently: Take 10 mg by mouth as directed. TAKE 1 CAPSULE BY MOUTH EVERY DAY FOR 14 DAYS ON THEN 7 DAYS OFF 10/01/18  Yes [name], MD  oxyCODONE (OXY IR/ROXICODONE) 5 MG immediate release tablet Take 5 mg by mouth every 6 (six) hours as needed for severe pain.   Yes [provider]  Polyethylene Glycol 3350 (MIRALAX PO) Take 17 g by mouth daily.   Yes [provider]  prochlorperazine (COMPAZINE) 10 MG tablet Take 10 mg by mouth. On 3 weeks off 1 week   Yes [provider]  Zoledronic Acid 4 MG SOLR Inject 4 mg into the vein every 3 (three) months.    Yes [provider]  amoxicillin-clavulanate (AUGMENTIN  XR) 1000-62.5 MG 12 hr tablet Take 2 tablets by mouth 2 (two) times daily. X 5 days 10/11/18   [name], MD  HYDROcodone-acetaminophen (NORCO/VICODIN) 5-325 MG tablet Take 1-1.5 tablets by mouth every 4 (four) hours as needed for moderate pain. 10/11/18   [name], MD    Family History Family History  Problem Relation Age of Onset  . Skin cancer Mother   . Atrial fibrillation Mother   . Atrial fibrillation Father   . Melanoma Father   . Diabetes Sister   . Melanoma Brother   . Stroke Other   . CAD Neg Hx     Social History Social History   Tobacco Use   . Smoking status: Never Smoker  . Smokeless tobacco: Never Used  Substance Use Topics  . Alcohol use: Yes    Comment: occ  . Drug use: Never     Allergies   Methocarbamol   Review of Systems Review of Systems  Constitutional: Negative for appetite change and fatigue.  HENT: Negative for congestion, ear discharge and sinus pressure.   Eyes: Negative for discharge.  Respiratory: Negative for cough.   Cardiovascular: Negative for chest pain.  Gastrointestinal: Negative for abdominal pain and diarrhea.  Genitourinary: Negative for frequency and hematuria.  Musculoskeletal: Negative for back pain.  Skin: Negative for rash.  Neurological: Negative for seizures and headaches.       Numbness to face  Psychiatric/Behavioral: Negative for hallucinations.     Physical Exam Updated Vital Signs BP 118/85 (BP Location: Right Arm)   Pulse 83   Temp 98.4 F (36.9 C) (Oral)   Resp 16   Ht _0  (1.676 m)   Wt 86.2 kg   SpO2 95%   BMI 30.67 kg/m   Physical Exam Constitutional:      Appearance: He is well-developed.  HENT:     Head: Normocephalic.     Nose: Nose normal.     Mouth/Throat:     Mouth: Mucous membranes are moist.  Eyes:     General: No scleral icterus.    Conjunctiva/sclera: Conjunctivae normal.  Neck:     Musculoskeletal: Neck supple.     Thyroid: No thyromegaly.  Cardiovascular:     Rate and Rhythm: Normal rate and regular rhythm.     Heart sounds: No murmur. No friction rub. No gallop.   Pulmonary:     Breath sounds: No stridor. No wheezing or rales.  Chest:     Chest wall: No tenderness.  Abdominal:     General: There is no distension.     Tenderness: There is no abdominal tenderness. There is no rebound.  Musculoskeletal: Normal range of motion.  Lymphadenopathy:     Cervical: No cervical adenopathy.  Skin:    Findings: No erythema or rash.  Neurological:     Mental Status: He is alert and oriented to person, place, and time.     Motor: No  abnormal muscle tone.     Coordination: Coordination normal.     Comments: Numbness to the right side of the face  Psychiatric:        Behavior: Behavior normal.      ED Treatments / Results  Labs (all labs ordered are listed, but only abnormal results are displayed) Labs Reviewed  CBC WITH DIFFERENTIAL/PLATELET - Abnormal; Notable for the following components:      Result Value   WBC 11.2 (*)    RBC 3.98 (*)    HCT 38.9 (*)  RDW 15.7 (*)    Neutro Abs 8.7 (*)    Lymphs Abs 0.6 (*)    Abs Immature Granulocytes 0.76 (*)    All other components within normal limits  COMPREHENSIVE METABOLIC PANEL - Abnormal; Notable for the following components:   Sodium 132 (*)    Chloride 97 (*)    Glucose, Bld 119 (*)    BUN 22 (*)    Calcium 8.7 (*)    Total Protein 6.0 (*)    All other components within normal limits    EKG None  Radiology Mr Brain Wo Contrast  Result Date: 10/18/2018 CLINICAL DATA:  Altered level of consciousness.  Multiple myeloma EXAM: MRI HEAD WITHOUT CONTRAST TECHNIQUE: Multiplanar, multiecho pulse sequences of the brain and surrounding structures were obtained without intravenous contrast. COMPARISON:  MRI cervical spine 10/11/2018 FINDINGS: Brain: Mild atrophy. Negative for hydrocephalus. Several small white matter hyper to intensities are present which appear chronic. No acute infarct. Negative for hemorrhage or mass lesion in the brain. Vascular: Normal arterial flow voids Skull and upper cervical spine: Abnormal C2 vertebral body as noted previously suspicious for multiple myeloma. No skull lesions identified. Sinuses/Orbits: Complete opacification sphenoid sinus. Remaining sinuses clear. 12 mm mass in the superior orbit on the right. This appears extra conal and is located superior to the muscles and slightly lateral. This is well-circumscribed with muscle signal intensity on T1 and T2. Probable incidental lesion such as a dermoid. Other: None IMPRESSION: Mild  chronic microvascular ischemic change in the white matter. No acute infarct. Abnormal bone marrow at C2 suggestive of myeloma.  No skull lesion Complete opacification sphenoid sinus presumably from obstruction and inflammatory disease. No bony destruction. 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   On: 10/18/2018 12:04    Procedures Procedures (including critical care time)  Medications Ordered in ED Medications  metoCLOPramide (REGLAN) injection 10 mg (10 mg Intravenous Given 10/18/18 0837)  diphenhydrAMINE (BENADRYL) injection 25 mg (25 mg Intravenous Given 10/18/18 0837)  ketorolac (TORADOL) 30 MG/ML injection 30 mg (30 mg Intravenous Given 10/18/18 0837)     Initial Impression / Assessment and Plan / ED Course  I have reviewed the triage vital signs and the nursing notes.  Pertinent labs & imaging results that were available during my care of the patient were reviewed by me and considered in my medical decision making (see chart for details).     Patient with myeloma.  Patient has numbness to face.  I spoke with Dr. Ammie Dalton and he wants a neurology consult to see if the patient needs any additional studies done.  He is made arrangements for the patient to see someone on Monday who is more specialized in neuro oncology.  Final Clinical Impressions(s) / ED Diagnoses   Final diagnoses:  None    ED Discharge Orders    None       Milton Ferguson, MD 10/18/18 [phone]

## 2018-10-18 NOTE — ED Notes (Signed)
Pt to be admitted.  Awaiting room assignment.

## 2018-10-19 DIAGNOSIS — R200 Anesthesia of skin: Secondary | ICD-10-CM | POA: Diagnosis not present

## 2018-10-19 DIAGNOSIS — C9000 Multiple myeloma not having achieved remission: Secondary | ICD-10-CM | POA: Diagnosis not present

## 2018-10-19 LAB — BASIC METABOLIC PANEL
Anion gap: 10 (ref 5–15)
BUN: 23 mg/dL — ABNORMAL HIGH (ref 6–20)
CHLORIDE: 104 mmol/L (ref 98–111)
CO2: 25 mmol/L (ref 22–32)
CREATININE: 1.05 mg/dL (ref 0.61–1.24)
Calcium: 8.7 mg/dL — ABNORMAL LOW (ref 8.9–10.3)
GFR calc non Af Amer: >60 mL/min (ref >60)
Glucose, Bld: 97 mg/dL (ref 70–99)
Potassium: 3.7 mmol/L (ref 3.5–5.1)
Sodium: 139 mmol/L (ref 135–145)

## 2018-10-19 MED ORDER — ACYCLOVIR 400 MG PO TABS: 800.0000 mg | ORAL_TABLET | Freq: Three times a day (TID) | ORAL | Status: DC

## 2018-10-19 MED ORDER — ACYCLOVIR 400 MG PO TABS: 800.0000 mg | ORAL_TABLET | Freq: Three times a day (TID) | ORAL | 0 refills | Status: AC

## 2018-10-19 MED ORDER — DEXAMETHASONE SODIUM PHOSPHATE 4 MG/ML IJ SOLN: 40.0000 mg | Freq: Once | INTRAMUSCULAR | Status: DC

## 2018-10-19 MED ORDER — SODIUM CHLORIDE 0.9 % IV SOLN
40.0000 mg | Freq: Once | INTRAVENOUS | Status: DC
  Filled 2018-10-19 (×2): qty 4

## 2018-10-19 MED ORDER — FAMOTIDINE 20 MG PO TABS: 40.0000 mg | ORAL_TABLET | Freq: Once | ORAL | Status: DC

## 2018-10-19 MED ORDER — SODIUM CHLORIDE 0.9 % IV SOLN
40.0000 mg | Freq: Once | INTRAVENOUS | Status: DC
  Filled 2018-10-19: qty 4

## 2018-10-19 NOTE — Progress Notes (Signed)
I stop and saw Paul Green today.  He is very nice.  He has myeloma.  To me, it looks like this might be some type of Bell's palsy.  I know that there is really no facial droop.  However, it sounds like he has symptoms similar to Bell's palsy.  He apparently had a cold a week ago.  I agree that it would be very hard to keep him over the weekend and not do anything.  He is stable.  I think given that this could be Bell's palsy, then I would probably up his acyclovir to 800 mg p.o. 3 times daily.  I would have him take that for 10 days.  I will also give him a dose of Decadron.  I will give him a 40 mg IV dose of Decadron.  He is eating.  He is having no problems with fever.  There is no issues with bowels or bladder.  He has had no problems with pain outside of that with the left shoulder.  I will let Dr. Benay Spice know that the patient is being discharged.  The patient is very happy that he can go home.  Lattie Haw, MD  Oswaldo Milian 7:14

## 2018-10-19 NOTE — Discharge Summary (Addendum)
[facility]  Physician Discharge Summary   Patient ID: Paul Green MRN: [id number] DOB/AGE: [id number] y.o.  Admit date: 10/18/2018 Discharge date: 10/19/2018  PCP: [name], MD  DISCHARGE DIAGNOSES:  Facial numbness and diplopia Multiple myeloma  RECOMMENDATIONS FOR OUTPATIENT FOLLOW UP: 1. LP to be done in the outpatient setting. Ambulatory Order sent to [facility] long radiology department. 2. Follow-up with medical oncology 3. Patient likely needs referral to ophthalmology as well   DISCHARGE CONDITION: fair  Diet recommendation: As before  [facility] Weights   10/18/18 0743  Weight: 86.2 kg    INITIAL HISTORY:  57 y.o. male with medical history significant for htn and multiple myeloma diagnosed earlier this year, presented with complaints of facial numbness and then developed diplopia while he was in the ED. Beginning about 24 hours prior to admission patient began experiencing insidious onset of left facial numbness. In ED has also began experiencing double vision.   Consultations:  Medical oncology  Telemetry neurology  Procedures:  Unsuccessful attempt at LP at bedside   HOSPITAL COURSE:   Right facial numbness with diplopia Etiology unclear but concern for neoplastic process.  Patient underwent MRI brain which did not show any acute intracranial process.  Lesions were noted in the cervical spine thought to be due to myeloma. A 12 x 5 mm well-circumscribed mass right superior orbit likely dermoid was noted as well.  Patient was hospitalized to undergo lumbar puncture however unfortunately due to need for pathology this will be difficult to accomplish over the weekend.  Discussions were held with Dr. Laurence Ferrari with interventional radiology.  He states that the earliest date that this could be done would be Monday.  Patient is afebrile.  There is no meningeal signs.  No concern for infection.  This is mainly to rule out neoplastic involvement of the  meninges.  Discussed with Dr. Marin Olp with oncology as well.  Patient does not want to wait till Monday to undergo this procedure.  He wants to go home.  He is otherwise hemodynamically stable.  He was ordered 1 dose of dexamethasone.  Dr. Marin Olp also concerned about Bell's palsy so he was asked to take a higher dose of acyclovir.  But otherwise he was okay with the patient going home today.  An outpatient order has been placed for lumbar puncture on Monday.  Patient to get a call from interventional radiology.  If he does not he should call his oncologist on Monday.  Patient may also need a referral to ophthalmology as an outpatient.  History of multiple myeloma Followed by medical oncology.  Will be due for bortezomib and dexamethasone this coming Tuesday.  Dose of dexamethasone given today prior to discharge as per oncology.  Patient also on lenalidomide which is on hold per oncology.  CRP noted to be 5.1.  ESR 37.  Hyponatremia Likely due to hypovolemia. Resolved.  Overall stable.  Patient not willing to stay here in the hospital till Monday to undergo LP.  He is otherwise medically stable.  Okay for discharge.  Patient was hospitalized under inpatient status as he was expected to stay more than 2 midnights however due to circumstances described above he was discharged earlier than anticipated.   PERTINENT LABS:  The results of significant diagnostics from this hospitalization (including imaging, microbiology, ancillary and laboratory) are listed below for reference.     Labs: Basic Metabolic Panel: Recent Labs  Lab 10/18/18 0820 10/19/18 0523  NA 132* 139  K 3.5 3.7  CL  97* 104  CO2 25 25  GLUCOSE 119* 97  BUN 22* 23*  CREATININE 0.91 1.05  CALCIUM 8.7* 8.7*   Liver Function Tests: Recent Labs  Lab 10/18/18 0820  AST 24  ALT 37  ALKPHOS 65  BILITOT 0.5  PROT 6.0*  ALBUMIN 3.5   CBC: Recent Labs  Lab 10/18/18 0820  WBC 11.2*  NEUTROABS 8.7*  HGB 13.0  HCT 38.9*   MCV 97.7  PLT 210    IMAGING STUDIES Dg Chest 2 View  Result Date: 10/11/2018 CLINICAL DATA:  57 year old male with multiple myeloma and cough EXAM: CHEST - 2 VIEW COMPARISON:  01/23/2018 FINDINGS: Cardiomediastinal silhouette unchanged in size and contour. Low lung volumes. Patchy airspace opacity in the right lower lung. No pneumothorax. No pleural effusion. No displaced fracture.  Degenerative changes. IMPRESSION: Low lung volumes, with airspace opacity in the right lower lung concerning for infection given the history. Followup PA and lateral chest X-ray is recommended in 3-4 weeks following treatment to assure resolution. Electronically Signed   By: Corrie Mckusick D.O.   On: 10/11/2018 16:42   Mr Brain Wo Contrast  Result Date: 10/18/2018 CLINICAL DATA:  Altered level of consciousness.  Multiple myeloma EXAM: MRI HEAD WITHOUT CONTRAST TECHNIQUE: Multiplanar, multiecho pulse sequences of the brain and surrounding structures were obtained without intravenous contrast. COMPARISON:  MRI cervical spine 10/11/2018 FINDINGS: Brain: Mild atrophy. Negative for hydrocephalus. Several small white matter hyper to intensities are present which appear chronic. No acute infarct. Negative for hemorrhage or mass lesion in the brain. Vascular: Normal arterial flow voids Skull and upper cervical spine: Abnormal C2 vertebral body as noted previously suspicious for multiple myeloma. No skull lesions identified. Sinuses/Orbits: Complete opacification sphenoid sinus. Remaining sinuses clear. 12 mm mass in the superior orbit on the right. This appears extra conal and is located superior to the muscles and slightly lateral. This is well-circumscribed with muscle signal intensity on T1 and T2. Probable incidental lesion such as a dermoid. Other: None IMPRESSION: Mild chronic microvascular ischemic change in the white matter. No acute infarct. Abnormal bone marrow at C2 suggestive of myeloma.  No skull lesion Complete  opacification sphenoid sinus presumably from obstruction and inflammatory disease. No bony destruction. 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   On: 10/18/2018 12:04   Mr Brain W Contrast  Result Date: 10/18/2018 CLINICAL DATA:  Right facial numbness. Concern for myeloma involvement of the C2 vertebral body based on prior MRI. EXAM: MRI HEAD WITH CONTRAST TECHNIQUE: Multiplanar, multiecho pulse sequences of the brain and surrounding structures were obtained with intravenous contrast. CONTRAST:  9 mL Gadavist COMPARISON:  Brain MRI 10/18/2018 FINDINGS: Postcontrast imaging was performed for further evaluation of earlier findings from the noncontrast MRI. There is no abnormal intraparenchymal contrast enhancement within the brain. The postcontrast T1-weighted signal intensity of the C2 vertebral body is isointense to the remainder of the bone marrow. No significant contrast enhancement of the superior right orbital lesion. IMPRESSION: No hyperenhancement of the C2 vertebral body relative to the other bone marrow, though this does not entirely exclude marrow replacement disorders. For future exams, this area is better characterized on cervical spine imaging. Electronically Signed   By: Ulyses Jarred M.D.   On: 10/18/2018 18:03   Mr Cervical Spine Wo Contrast  Result Date: 10/11/2018 CLINICAL DATA:  Multiple myeloma in remission. Neck pain and arm weakness. EXAM: MRI CERVICAL SPINE WITHOUT CONTRAST TECHNIQUE:  Multiplanar, multisequence MR imaging of the cervical spine was performed. No intravenous contrast was administered. COMPARISON:  Radiography 07/30/2018 FINDINGS: Alignment: Normal Vertebrae: Fluid intensity focus within the anterior central C4 vertebral body, most consistent with a previously treated myeloma lesion. The appearance is not presently suggestive of active tumor. There is abnormal marrow space signal of the C2  vertebra, worrisome for myeloma involvement. No evidence of fracture. There is also some involvement on the left at the upper C3 vertebral body. There could be some extraosseous tumor in that region, possibly involving the paraspinous soft tissues and foramen transversarium region. Cord: No cord compression or primary cord lesion. Posterior Fossa, vertebral arteries, paraspinal tissues: Some mucosal inflammatory changes of the sphenoid sinus. In the posterior portion of the sphenoid sinus, there is an abnormality measuring up to 3.3 cm in size that probably represents a chronic retention cyst with mucoid material. However, the possibility of a mass or polyp is not completely excluded based on these images. This could be evaluated at the time of postcontrast cervical imaging. Disc levels: Foramen magnum is widely patent. There is minimal non-compressive spondylosis at C2-3, C3-4 C4-5 and C5-6. At C6-7, there is a broad-based right posterolateral predominant disc osteophyte complex that narrows the right side of the canal but does not compress the cord. Right foraminal narrowing could affect the exiting C7 nerve. C7-T1: Negative. IMPRESSION: Abnormal marrow signal of the C2 vertebral body worrisome for myeloma involvement. There is also some involvement of the left upper corner of the C3 vertebral body. Question some extraosseous tumor on the left, possibly with some involvement of the intervertebral foramen and transverse foramen. I would suggest postcontrast imaging to evaluate this further. Cystic area in the C4 vertebral body most consistent with a previously treated myeloma lesion. Probable retention cyst in the sphenoid sinus. Mass or polyp not completely excluded. This could be evaluated at the time of postcontrast imaging. Right-sided predominant spondylosis at C6-7 with right foraminal narrowing due to osteophyte and disc material that could compress the right C7 nerve. Electronically Signed   By: Nelson Chimes M.D.   On: 10/11/2018 16:04   Ct Shoulder Left Wo Contrast  Result Date: 09/25/2018 CLINICAL DATA:  Left lateral neck and shoulder pain with difficulty raising arm. History of multiple myeloma. Evaluate for lytic lesion. EXAM: CT OF THE UPPER LEFT EXTREMITY WITHOUT CONTRAST TECHNIQUE: Multidetector CT imaging of the upper left extremity was performed according to the standard protocol. COMPARISON:  None. FINDINGS: Bones/Joint/Cartilage No focal bone lesion. No fracture or dislocation. Normal alignment. No joint effusion. Mild arthropathy of the glenohumeral and acromioclavicular joints. Type I acromion. Ligaments Ligaments are suboptimally evaluated by CT. Muscles and Tendons No muscle atrophy. The rotator cuff tendons are not well evaluated by CT. No large retracted tear. Soft tissue No fluid collection or hematoma. No soft tissue mass. The visualized left lung is clear. IMPRESSION: 1. No focal bone lesion. 2.  No acute osseous abnormality. 3. Mild glenohumeral and acromioclavicular degenerative changes. Electronically Signed   By: Titus Dubin M.D.   On: 09/25/2018 10:30    DISCHARGE EXAMINATION: Vitals:   10/18/18 2000 10/18/18 2114 10/18/18 2140 10/18/18 2200  BP: (!) 121/94 (!) 143/90  (!) 136/93  Pulse: (!) 119 (!) 106  100  Resp: '17 16  18  ' Temp:   97.6 F (36.4 C) (!) 97.5 F (36.4 C)  TempSrc:   Oral Oral  SpO2: 92% 95%  93%  Weight:      Height:  General appearance: alert, cooperative, appears stated age and no distress Resp: clear to auscultation bilaterally Cardio: regular rate and rhythm, S1, S2 normal, no murmur, click, rub or gallop GI: soft, non-tender; bowel sounds normal; no masses,  no organomegaly Alert and oriented x3.  No pronator drift.  Motor strength equal bilateral upper and lower extremity.  Extra ocular movements noted to be intact.  DISPOSITION: Home  Discharge Instructions    Call MD for:  difficulty breathing, headache or visual  disturbances   Complete by:  As directed    Call MD for:  extreme fatigue   Complete by:  As directed    Call MD for:  persistant dizziness or light-headedness   Complete by:  As directed    Call MD for:  persistant nausea and vomiting   Complete by:  As directed    Call MD for:  severe uncontrolled pain   Complete by:  As directed    Call MD for:  temperature >100.4   Complete by:  As directed    Diet general   Complete by:  As directed    Discharge instructions   Complete by:  As directed    Please call Dr. Gearldine Shown office on Monday to see if your lumbar puncture has been scheduled if you do not hear from the radiology department by then.  Take the higher dose of acyclovir for 10 days.  Seek attention immediately if your symptoms were to get significantly worse, if you develop severe headaches, fever, chills nausea vomiting.  You were cared for by a hospitalist during your hospital stay. If you have any questions about your discharge medications or the care you received while you were in the hospital after you are discharged, you can call the unit and asked to speak with the hospitalist on call if the hospitalist that took care of you is not available. Once you are discharged, your primary care physician will handle any further medical issues. Please note that NO REFILLS for any discharge medications will be authorized once you are discharged, as it is imperative that you return to your primary care physician (or establish a relationship with a primary care physician if you do not have one) for your aftercare needs so that they can reassess your need for medications and monitor your lab values. If you do not have a primary care physician, you can call [phone] for a physician referral.   Increase activity slowly   Complete by:  As directed        Allergies as of 10/19/2018      Reactions   Methocarbamol Other (See Comments)   Caused headaches      Medication List    STOP taking  these medications   HYDROcodone-acetaminophen 5-325 MG tablet Commonly known as:  NORCO/VICODIN   lenalidomide 10 MG capsule Commonly known as:  REVLIMID     TAKE these medications   acetaminophen 500 MG tablet Commonly known as:  TYLENOL Take 1,000 mg by mouth every 6 (six) hours as needed for fever or headache.   acyclovir 400 MG tablet Commonly known as:  ZOVIRAX Take 2 tablets (800 mg total) by mouth 3 (three) times daily. What changed:    how much to take  how to take this  when to take this  additional instructions   amLODipine 5 MG tablet Commonly known as:  NORVASC Take 1 tablet (5 mg total) by mouth daily.   aspirin 81 MG chewable tablet Chew 1 tablet (81  mg total) by mouth daily.   COLACE 100 MG capsule Generic drug:  docusate sodium Take 100 mg by mouth 2 (two) times daily.   DECADRON PO Take 40 mg by mouth once. On 3 weeks off 1 week   MIRALAX PO Take 17 g by mouth daily.   oxyCODONE 5 MG immediate release tablet Commonly known as:  Oxy IR/ROXICODONE Take 5 mg by mouth every 6 (six) hours as needed for severe pain.   prochlorperazine 10 MG tablet Commonly known as:  COMPAZINE Take 10 mg by mouth. On 3 weeks off 1 week   VELCADE IJ Inject 1.75 mg as directed. On 3 weeks on 1 week off   Zoledronic Acid 4 MG Solr Inject 4 mg into the vein every 3 (three) months.        Follow-up Information    [name], MD. Call.   Specialty:  Oncology Why:  call his office on monday to setup the LP Contact information: [place] [address] [phone]           TOTAL DISCHARGE TIME: 35 minutes  Bonnielee Haff  [facility] Pager [phone]  10/19/2018, 1:52 PM

## 2018-10-21 ENCOUNTER — Telehealth: Payer: Self-pay | Admitting: *Deleted

## 2018-10-21 ENCOUNTER — Ambulatory Visit: Payer: BLUE CROSS/BLUE SHIELD | Admitting: Radiation Oncology

## 2018-10-21 ENCOUNTER — Ambulatory Visit (HOSPITAL_COMMUNITY)
Admission: RE | Admit: 2018-10-21 | Discharge: 2018-10-21 | Disposition: A | Discharge status: Home / Self Care | Payer: BLUE CROSS/BLUE SHIELD | Source: Ambulatory Visit | Attending: Internal Medicine | Admitting: Internal Medicine

## 2018-10-21 ENCOUNTER — Ambulatory Visit (HOSPITAL_COMMUNITY): Admission: RE | Admit: 2018-10-21 | Payer: BLUE CROSS/BLUE SHIELD | Source: Ambulatory Visit

## 2018-10-21 ENCOUNTER — Encounter: Payer: Self-pay | Admitting: *Deleted

## 2018-10-21 ENCOUNTER — Ambulatory Visit: Payer: BLUE CROSS/BLUE SHIELD | Admitting: Internal Medicine

## 2018-10-21 DIAGNOSIS — R200 Anesthesia of skin: Secondary | ICD-10-CM

## 2018-10-21 DIAGNOSIS — C9000 Multiple myeloma not having achieved remission: Secondary | ICD-10-CM | POA: Diagnosis not present

## 2018-10-21 DIAGNOSIS — R836 Abnormal cytological findings in cerebrospinal fluid: Secondary | ICD-10-CM | POA: Diagnosis not present

## 2018-10-21 DIAGNOSIS — H538 Other visual disturbances: Secondary | ICD-10-CM | POA: Diagnosis not present

## 2018-10-21 LAB — CSF CELL COUNT WITH DIFFERENTIAL
RBC Count, CSF: 4 /mm3 — ABNORMAL HIGH
Tube #: 1
WBC, CSF: 1 /mm3 (ref 0–5)

## 2018-10-21 LAB — GLUCOSE, CSF: Glucose, CSF: 56 mg/dL (ref 40–70)

## 2018-10-21 LAB — PROTEIN, CSF: TOTAL PROTEIN, CSF: 68 mg/dL — AB (ref 15–45)

## 2018-10-21 MED ORDER — ACETAMINOPHEN 500 MG PO TABS
1000.0000 mg | ORAL_TABLET | Freq: Four times a day (QID) | ORAL | Status: DC | PRN
  Administered 2018-10-21: 1000 mg via ORAL
  Filled 2018-10-21: qty 2

## 2018-10-21 MED ORDER — LIDOCAINE HCL 1 % IJ SOLN
INTRAMUSCULAR | Status: AC
  Filled 2018-10-21: qty 20

## 2018-10-21 NOTE — Telephone Encounter (Addendum)
Left a voicemail for Mrs.  letting her know that we will cancel Paul Green's port and treat scheduled for today due to him having his lumbar puncture.  We will pick up tomorrow.  I also let her know that the therapist would be calling to let her know the exact time.  I left my call back number in the event she has further questions.  Will continue to follow as necessary.  Gloriajean Dell. Leonie Green, BSN

## 2018-10-21 NOTE — Discharge Instructions (Signed)
Lumbar Puncture, Care After °Refer to this sheet in the next few weeks. These instructions provide you with information on caring for yourself after your procedure. Your health care provider may also give you more specific instructions. Your treatment has been planned according to current medical practices, but problems sometimes occur. Call your health care provider if you have any problems or questions after your procedure. °What can I expect after the procedure? °After your procedure, it is typical to have the following sensations: °· Mild discomfort or pain at the insertion site. °· Mild headache that is relieved with pain medicines. ° °Follow these instructions at home: ° °· Avoid lifting anything heavier than 10 lb (4.5 kg) for at least 12 hours after the procedure. °· Drink enough fluids to keep your urine clear or pale yellow. °Contact a health care provider if: °· You have fever or chills. °· You have nausea or vomiting. °· You have a headache that lasts for more than 2 days. °Get help right away if: °· You have any numbness or tingling in your legs. °· You are unable to control your bowel or bladder. °· You have bleeding or swelling in your back at the insertion site. °· You are dizzy or faint. °This information is not intended to replace advice given to you by your health care provider. Make sure you discuss any questions you have with your health care provider. °Document Released: 10/28/2013 Document Revised: 03/30/2016 Document Reviewed: 07/01/2013 °Elsevier Interactive Patient Education © 2017 [employer] ° °

## 2018-10-21 NOTE — Telephone Encounter (Signed)
Patient called to follow up on status of the lumbar puncture that was to be done today. Also feels he needs to see neurologist today instead of waiting till tomorrow. Very anxious regarding his decline. Reports right side of face is very numb, tongue is numb and getting more difficult to swallow. Also having double vision-wearing a patch on his right eye to help. After speaking with Dr. Benay Spice, scheduled diagnostic LP at [facility] for 1245/1300 today. Informed him not to eat lunch and he needs a driver. He will see Dr. Mickeal Skinner at the [facility] after his LP procedure.

## 2018-10-21 NOTE — Telephone Encounter (Signed)
Received call from [facility] to advise that patient would need to go to short stay for approximately 4 hours to lay flat and would be unable to attend his appointment this afternoon.  Per Dr Mickeal Skinner ok to move out to end of the week when LP results will be available.  Wife Gwenette Greet questioned what scenrerios should she seek medical attention ASAP.  Those discussed with wife.  Wife also questioned if they should proceed with Radiation appt today assuming they are able to get to the appointment on time.  Spoke with Dr Ida Rogue nurse Phineas Real advised of clinical changes and they will contact wife to discuss how to proceed with todays appointments and they will contact her to instruct.

## 2018-10-21 NOTE — Progress Notes (Signed)
Per Dr. Benay Spice, patient does not need a CMP lab on 10/22/18.

## 2018-10-21 NOTE — Progress Notes (Signed)
Patient requests to leave unit by 1600 due to appointment with [facility] at 1415 as well as patient has great difficulty lying flat.  Dr. Tery Sanfilippo agreed with patient leaving prior to ordered discharge time of 1700.  Patient educated importance of lying flat for the remainder of the day as well as increase risk for developing a headache with not lying flat for 4 hours post lumbar puncture as ordered.  Patient and wife verbalized understanding.

## 2018-10-22 ENCOUNTER — Inpatient Hospital Stay: Payer: BLUE CROSS/BLUE SHIELD

## 2018-10-22 ENCOUNTER — Encounter: Payer: Self-pay | Admitting: *Deleted

## 2018-10-22 ENCOUNTER — Inpatient Hospital Stay (HOSPITAL_BASED_OUTPATIENT_CLINIC_OR_DEPARTMENT_OTHER): Payer: BLUE CROSS/BLUE SHIELD | Admitting: Oncology

## 2018-10-22 ENCOUNTER — Ambulatory Visit: Payer: BLUE CROSS/BLUE SHIELD

## 2018-10-22 ENCOUNTER — Ambulatory Visit: Payer: BLUE CROSS/BLUE SHIELD | Admitting: Radiation Oncology

## 2018-10-22 DIAGNOSIS — C9000 Multiple myeloma not having achieved remission: Secondary | ICD-10-CM | POA: Diagnosis not present

## 2018-10-22 DIAGNOSIS — G588 Other specified mononeuropathies: Secondary | ICD-10-CM

## 2018-10-22 DIAGNOSIS — M25512 Pain in left shoulder: Secondary | ICD-10-CM | POA: Diagnosis not present

## 2018-10-22 DIAGNOSIS — R05 Cough: Secondary | ICD-10-CM

## 2018-10-22 DIAGNOSIS — Z79899 Other long term (current) drug therapy: Secondary | ICD-10-CM

## 2018-10-22 DIAGNOSIS — H532 Diplopia: Secondary | ICD-10-CM

## 2018-10-22 LAB — CBC WITH DIFFERENTIAL (CANCER CENTER ONLY)
Abs Immature Granulocytes: 0.08 10*3/uL — ABNORMAL HIGH (ref 0.00–0.07)
Basophils Absolute: 0 10*3/uL (ref 0.0–0.1)
Basophils Relative: 0 %
EOS ABS: 0.1 10*3/uL (ref 0.0–0.5)
Eosinophils Relative: 2 %
HCT: 41 % (ref 39.0–52.0)
Hemoglobin: 13.9 g/dL (ref 13.0–17.0)
Immature Granulocytes: 1 %
Lymphocytes Relative: 10 %
Lymphs Abs: 0.9 10*3/uL (ref 0.7–4.0)
MCH: 31.7 pg (ref 26.0–34.0)
MCHC: 33.9 g/dL (ref 30.0–36.0)
MCV: 93.6 fL (ref 80.0–100.0)
Monocytes Absolute: 0.9 10*3/uL (ref 0.1–1.0)
Monocytes Relative: 10 %
Neutro Abs: 6.9 10*3/uL (ref 1.7–7.7)
Neutrophils Relative %: 77 %
Platelet Count: 226 10*3/uL (ref 150–400)
RBC: 4.38 MIL/uL (ref 4.22–5.81)
RDW: 15.3 % (ref 11.5–15.5)
WBC Count: 8.9 10*3/uL (ref 4.0–10.5)
nRBC: 0 % (ref 0.0–0.2)

## 2018-10-22 LAB — HSV DNA BY PCR (REFERENCE LAB)
HSV 1 DNA: NEGATIVE
HSV 2 DNA: NEGATIVE

## 2018-10-22 LAB — VDRL, CSF: VDRL Quant, CSF: NONREACTIVE

## 2018-10-22 NOTE — Progress Notes (Signed)
Per Dr. Benay Spice, pt will not receive chemotherapy treatment or radiation treatment today.

## 2018-10-22 NOTE — Progress Notes (Signed)
[place] OFFICE PROGRESS NOTE   Diagnosis: Multiple myeloma  INTERVAL HISTORY:   Paul Green returns for scheduled Velcade.  He presented to the emergency room on 10/18/2018 with right face and tongue numbness.  He also developed blurred vision while in the emergency room.  A noncontrast MRI of the brain was unremarkable.  He was admitted for further evaluation.  A contrast MRI of the brain revealed no abnormal enhancement in the brain.  No hyperenhancement was noted at C2 relative to the other bone marrow. An attempted a lumbar puncture in the emergency room on 10/18/2018 was unsuccessful.  He was discharged home on 10/19/2018.  He underwent a fluoroscopy guided lumbar puncture in radiology on 10/21/2018.  An initial attempt at L3-L4 was unsuccessful.  A lumbar puncture was performed at L4-5 with 9 cc of CSF obtained.  The glucose returned at 56, protein 68, 4 RBCs, 1 WBC.  The cytology ([id number]) revealed atypical cells with plasmacytoid features.  The morphology appears similar to the plasma cell neoplasm diagnosed earlier this year.  Mr. Madeira continues to have numbness at the right side of the face and tongue.  He is "biting "his tongue.  He has developed diplopia over the past several days.  No difficulty with swallowing.  No fever.  He has a mild intermittent cough.  He continues to have pain in the left shoulder.  Objective:  Vital signs in last 24 hours:  There were no vitals taken for this visit.    HEENT: No thrush Resp: Lungs clear bilaterally Cardio: Regular rate and rhythm GI: No hepatosplenomegaly, nontender Vascular: No leg edema Neuro: Alert and oriented, the speech is fluent, the motor exam appears intact in the upper and lower extremities.  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  The tongue deviates to the right.  The gag reflex is intact     Lab Results:  Lab Results  Component Value Date   WBC 8.9 10/22/2018   HGB 13.9 10/22/2018   HCT 41.0  10/22/2018   MCV 93.6 10/22/2018   PLT 226 10/22/2018   NEUTROABS 6.9 10/22/2018    CMP  Lab Results  Component Value Date   NA 139 10/19/2018   K 3.7 10/19/2018   CL 104 10/19/2018   CO2 25 10/19/2018   GLUCOSE 97 10/19/2018   BUN 23 (H) 10/19/2018   CREATININE 1.05 10/19/2018   CALCIUM 8.7 (L) 10/19/2018   PROT 6.0 (L) 10/18/2018   ALBUMIN 3.5 10/18/2018   AST 24 10/18/2018   ALT 37 10/18/2018   ALKPHOS 65 10/18/2018   BILITOT 0.5 10/18/2018   GFRNONAA >60 10/19/2018   GFRAA >60 10/19/2018     Imaging:  Mr Brain W Contrast  Result Date: 10/18/2018 CLINICAL DATA:  Right facial numbness. Concern for myeloma involvement of the C2 vertebral body based on prior MRI. EXAM: MRI HEAD WITH CONTRAST TECHNIQUE: Multiplanar, multiecho pulse sequences of the brain and surrounding structures were obtained with intravenous contrast. CONTRAST:  9 mL Gadavist COMPARISON:  Brain MRI 10/18/2018 FINDINGS: Postcontrast imaging was performed for further evaluation of earlier findings from the noncontrast MRI. There is no abnormal intraparenchymal contrast enhancement within the brain. The postcontrast T1-weighted signal intensity of the C2 vertebral body is isointense to the remainder of the bone marrow. No significant contrast enhancement of the superior right orbital lesion. IMPRESSION: No hyperenhancement of the C2 vertebral body relative to the other bone marrow, though this does not entirely exclude marrow replacement disorders. For  future exams, this area is better characterized on cervical spine imaging. Electronically Signed   By: Ulyses Jarred M.D.   On: 10/18/2018 18:03   Dg Fluoro Guided Needle Plc Aspiration/injection Loc  Result Date: 10/21/2018 CLINICAL DATA:  Multiple myeloma with focal neurologic deficit. Clinical concern for CNS involvement. EXAM: DIAGNOSTIC LUMBAR PUNCTURE UNDER FLUOROSCOPIC GUIDANCE FLUOROSCOPY TIME:  Fluoroscopy Time:  0 minutes an 40 seconds Radiation Exposure  Index (if provided by the fluoroscopic device): 8.9 mGy Number of Acquired Spot Images: PROCEDURE: Prior to beginning the procedure, risks, benefits, and alternatives were discussed with the patient. Emphasize risks included bleeding, infection, inability to stain CSF, headache, and spinal hematoma. Patient voiced understanding and wished to proceed. Written informed consent was obtained. Patient time-out was performed prior to fluoroscopic localization. With the patient prone, fluoroscopic guidance was used to localize the L3-4 and L4-5 levels. The lower back was prepped with Betadine. 1% Lidocaine was used for local anesthesia. Initial attempt was performed at the L3-4 level using a 20 gauge needle, but spinal canal could not be localized. Subsequent repositioning of the needle at L4-5 resulted in return of clear CSF on the first pass. Opening pressure of 10 cm water. 9 ml of CSF were obtained for laboratory studies. The patient tolerated the procedure well and there were no apparent complications. IMPRESSION: 1. Successful fluoro guided lumbar puncture at L4-5 with 9 cc clear CSF obtained. 2. Patient tolerated the procedure well without immediate complications. Electronically Signed   By: Misty Stanley M.D.   On: 10/21/2018 13:38    Medications: I have reviewed the patient's current medications.   Assessment/Plan: 1. Multiple myeloma   Anemia/thrombocytopenia  Bone marrow biopsy 01/25/2018-hypercellular bone marrow with extensive involvement by plasma cell neoplasm; flow cytometry with a population of cells with plasmacytic differentiation (13%), no monoclonal B-cell population identified, T cells with nonspecific changes.  Cytogenetics-multiple rearrangements, FISH panel negative for cytogenetic abnormalities to be associated with multiple myeloma including loss of p53  01/24/2018 SPEP-noM spike,elevated serum lambda free light chains (432)  Dexamethasone 40 mg daily times 4 days beginning  01/25/2018  Velcade/dexamethasone weekly x3 followed by a 1 week break beginning3/26/2019(weekly dexamethasone initiated 02/05/2018, Revlimid initiated 02/09/2018)  Lambda light chains improved 02/26/2018  Cycle 2 RVD 03/05/2018 (Velcade dose reduced due to neutropenia), day 8 Velcade held and Revlimid discontinued secondary to recurrent severe neutropenia;day 15 Revlimid resumed x1 week, Velcade held;  Revlimid and Velcade placed on hold 04/02/2018 due to persistent neutropenia.  Bone marrow biopsy at [facility] 04/18/2018-hypercellular marrow, no evidence of myeloma  Cycle 3 RVD started 04/23/2018, Revlimid 5 mg dailyfor 14 days beginning 05/01/2018  Cycle 4 RVD 05/22/2018,Revlimid 5 mg daily for 14 days beginning 05/29/2018  Cycle 5 RVD 06/18/2018, Velcade escalated to standard dosing, Revlimid increased to 10 mg  Cycle 6 RVD 9/10/2019but discontinued after day 1 Velcade  Stem cell harvest at [facility] 08/12/2018  Cycle 7 RVD 08/20/2018  Cycle 8 RVD 09/17/2018, day 15 therapy held secondary to patient vacation  Cycle 9 RVD 10/15/2018, treatment discontinued secondary to neurologic symptoms 10/18/2018 2. Hypercalcemia status post pamidronate 01/24/2018, resolved 3. Diffuse abnormal bone marrow signal, compression fracture of L1 4. Back painsecondary to multiple myeloma, compression fracture.Improved. 5. Anorexia/weight loss-improved 6. 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 Severe neutropenia-recurrent after Revlimid resumed 03/19/2018, resolved 8. Bilateral shoulder pain, neck pain, left arm weakness 10/11/2018-MRI cervical spine- abnormal marrow signal at C2 and upper corner of C3, involvement of the transverse and intervertebral foramen,  treated myeloma lesion at C4 9. Pneumonia 10/11/2018-treated with Augmentin and azithromycin 10. Multiple cranial nerve abnormalities beginning 10/18/2018-lumbar puncture 10/21/2018 consistent with involvement by plasma cell  neoplasm    Disposition: Mr. Zappia has a history of multiple myeloma diagnosed in March of this year.  He entered clinical remission with RVD chemotherapy.  He declined stem cell therapy and has continued RVD.  He now has multiple cranial nerve deficits and a lumbar puncture yesterday is consistent with involvement by plasma cell neoplasm.  I discussed the CSF findings with Mr. Strohecker and his wife.  RVD will be placed on hold.  He understands this is an unusual clinical presentation for patients with multiple myeloma.  I will contact Dr. Amalia Hailey to discuss treatment options.  He is scheduled to see Dr. Amalia Hailey tomorrow.  We discussed potential treatments including intrathecal chemotherapy, craniospinal radiation, and systemic chemotherapy.  He is scheduled for an office visit here 10/29/2018.  I am available to see him sooner as needed.  40 minutes were spent with the patient today.  The majority of the time was used for counseling and coordination of care.  Betsy Coder, MD  10/22/2018  3:49 PM

## 2018-10-22 NOTE — Progress Notes (Signed)
Wife left note with update for Dr. Benay Spice:  Patient stopped all opiods last Tuesday (12/10). Using ES Tylenol. This morning took Xanax 0.5 mg and it helped. Having insomnia, anxiety as well Have appointment with Dr. Effie Berkshire at [facility] 12/18 at 0930. Requesting films be pushed to them. Also have appointment today with opthamologist to treat his double vision with prism glasses. Has appointment scheduled with PT, Linton Rump who has seen him in the past and knows his story to try to relieve left shoulder and arm pain. MD was given note to review. Called radiology to have his brain, C-spine MRIs and CT shoulder pushed over to [facility] system to view tomorrow.

## 2018-10-23 ENCOUNTER — Ambulatory Visit: Payer: BLUE CROSS/BLUE SHIELD

## 2018-10-23 ENCOUNTER — Encounter: Payer: Self-pay | Admitting: *Deleted

## 2018-10-23 ENCOUNTER — Telehealth: Payer: Self-pay

## 2018-10-23 ENCOUNTER — Telehealth: Payer: Self-pay | Admitting: Pharmacist

## 2018-10-23 ENCOUNTER — Telehealth: Payer: Self-pay | Admitting: *Deleted

## 2018-10-23 DIAGNOSIS — R52 Pain, unspecified: Secondary | ICD-10-CM | POA: Diagnosis not present

## 2018-10-23 DIAGNOSIS — C9000 Multiple myeloma not having achieved remission: Secondary | ICD-10-CM

## 2018-10-23 DIAGNOSIS — Z6830 Body mass index (BMI) 30.0-30.9, adult: Secondary | ICD-10-CM | POA: Diagnosis not present

## 2018-10-23 DIAGNOSIS — Z5111 Encounter for antineoplastic chemotherapy: Secondary | ICD-10-CM | POA: Diagnosis not present

## 2018-10-23 DIAGNOSIS — G529 Cranial nerve disorder, unspecified: Secondary | ICD-10-CM | POA: Diagnosis not present

## 2018-10-23 DIAGNOSIS — Z515 Encounter for palliative care: Secondary | ICD-10-CM | POA: Diagnosis not present

## 2018-10-23 DIAGNOSIS — Z79899 Other long term (current) drug therapy: Secondary | ICD-10-CM | POA: Diagnosis not present

## 2018-10-23 DIAGNOSIS — R51 Headache: Secondary | ICD-10-CM | POA: Diagnosis not present

## 2018-10-23 DIAGNOSIS — Z7982 Long term (current) use of aspirin: Secondary | ICD-10-CM | POA: Diagnosis not present

## 2018-10-23 DIAGNOSIS — C7949 Secondary malignant neoplasm of other parts of nervous system: Secondary | ICD-10-CM | POA: Diagnosis not present

## 2018-10-23 NOTE — Telephone Encounter (Signed)
Per message from [facility] to Dr. Benay Spice: Will need to change Revlimid to Pomalyst 4 mg daily x 21 days on, 7 off. Called [employer] to initiate the change in therapy. They will fax form for MD and patient to sign before we can proceed. Scheduling message sent for 1st daratumumab to begin on 10/29/18 with labs on 12/23. Dr. Benay Spice is willing to see him in the infusion area that day. Ordered pretreatment RBC phenotype.

## 2018-10-23 NOTE — Telephone Encounter (Signed)
.  Oral Oncology Patient Advocate Encounter  Received notification from [place] that prior authorization for Pomalyst is required.  PA submitted on CoverMyMeds Key [id number] Status is pending  Oral Oncology Clinic will continue to follow.  [place] Patient [place] Phone [phone] Fax [phone]

## 2018-10-23 NOTE — Telephone Encounter (Addendum)
Oral Oncology Pharmacist Encounter  Received new referral for POmalyst (pomalidomide) for the treatment of progressive multiple myeloma not having achieved remission in conjunction with daratumumab and dexamethasone, planned duration until disease progression or unacceptable toxicity.  Patient originally treated with Velcade, Revlimid, and dexamethasone 01/29/18-10/18/18, now discontinued due to disease progression, including CNS involvement  Pomalyst will be given by mouth at 85m daily for 21 days on, 7 days off, repeated every 28 days Daratumumab will be administered at 16 mg/kg weekly x 8, then every other week x 8, then move to once every 4 weeks Dexamethasone will be administered at 470monce weekly by mouth  Labs from Epic assessed, OK for treatment.  Current medication list in Epic reviewed, no DDIs with Pomalyst identified.  Prescription will be e-scribed to [facility] for dispensing once insurance authorization is obtained. This is where patient has been receiving Revlimid.  Oral Oncology Clinic will continue to follow for insurance authorization, copayment issues, initial counseling and start date.  JeJohny DrillingPharmD, BCPS, BCOP  10/23/2018 3:12 PM Oral Oncology Clinic [phone]

## 2018-10-24 ENCOUNTER — Ambulatory Visit: Payer: BLUE CROSS/BLUE SHIELD

## 2018-10-24 ENCOUNTER — Encounter: Payer: Self-pay | Admitting: *Deleted

## 2018-10-24 DIAGNOSIS — G529 Cranial nerve disorder, unspecified: Secondary | ICD-10-CM | POA: Diagnosis not present

## 2018-10-24 DIAGNOSIS — R51 Headache: Secondary | ICD-10-CM | POA: Diagnosis not present

## 2018-10-24 DIAGNOSIS — Z5111 Encounter for antineoplastic chemotherapy: Secondary | ICD-10-CM | POA: Diagnosis not present

## 2018-10-24 DIAGNOSIS — C9000 Multiple myeloma not having achieved remission: Secondary | ICD-10-CM | POA: Diagnosis not present

## 2018-10-24 DIAGNOSIS — R52 Pain, unspecified: Secondary | ICD-10-CM | POA: Diagnosis not present

## 2018-10-24 MED ORDER — SODIUM CHLORIDE 0.9 % IV SOLN
1000.00 | INTRAVENOUS | Status: DC
Start: ? — End: 2018-10-24

## 2018-10-24 MED ORDER — AMLODIPINE BESYLATE 5 MG PO TABS
5.00 | ORAL_TABLET | ORAL | Status: DC
Start: 2018-10-26 — End: 2018-10-24

## 2018-10-24 MED ORDER — ACETAMINOPHEN 500 MG PO TABS
1000.00 | ORAL_TABLET | ORAL | Status: DC
Start: 2018-10-25 — End: 2018-10-24

## 2018-10-24 MED ORDER — MELATONIN 3 MG PO TABS
3.00 | ORAL_TABLET | ORAL | Status: DC
Start: ? — End: 2018-10-24

## 2018-10-24 MED ORDER — GENERIC EXTERNAL MEDICATION
2.00 | Status: DC
Start: ? — End: 2018-10-24

## 2018-10-24 MED ORDER — GENERIC EXTERNAL MEDICATION
Status: DC
Start: ? — End: 2018-10-24

## 2018-10-24 MED ORDER — SODIUM CHLORIDE 0.9 % IV SOLN
20.00 | INTRAVENOUS | Status: DC
Start: ? — End: 2018-10-24

## 2018-10-24 MED ORDER — MEPERIDINE HCL 25 MG/ML IJ SOLN
25.00 | INTRAMUSCULAR | Status: DC
Start: ? — End: 2018-10-24

## 2018-10-24 MED ORDER — DIPHENHYDRAMINE HCL 50 MG/ML IJ SOLN
25.00 | INTRAMUSCULAR | Status: DC
Start: ? — End: 2018-10-24

## 2018-10-24 MED ORDER — METHYLPREDNISOLONE SODIUM SUCC 125 MG IJ SOLR
125.00 | INTRAMUSCULAR | Status: DC
Start: ? — End: 2018-10-24

## 2018-10-24 MED ORDER — ACYCLOVIR 800 MG PO TABS
400.00 | ORAL_TABLET | ORAL | Status: DC
Start: 2018-10-26 — End: 2018-10-24

## 2018-10-24 MED ORDER — SODIUM CHLORIDE FLUSH 0.9 % IV SOLN
10.00 | INTRAVENOUS | Status: DC
Start: 2018-10-25 — End: 2018-10-24

## 2018-10-24 MED ORDER — GENERIC EXTERNAL MEDICATION
Status: DC
Start: 2018-10-24 — End: 2018-10-24

## 2018-10-24 MED ORDER — OXYCODONE HCL 5 MG PO TABS
5.00 | ORAL_TABLET | ORAL | Status: DC
Start: ? — End: 2018-10-24

## 2018-10-24 MED ORDER — FAMOTIDINE 20 MG/2ML IV SOLN
20.00 | INTRAVENOUS | Status: DC
Start: ? — End: 2018-10-24

## 2018-10-24 MED ORDER — EPINEPHRINE 0.3 MG/0.3ML IJ SOAJ
0.30 | INTRAMUSCULAR | Status: DC
Start: ? — End: 2018-10-24

## 2018-10-24 MED ORDER — LOPERAMIDE HCL 2 MG PO CAPS
2.00 | ORAL_CAPSULE | ORAL | Status: DC
Start: ? — End: 2018-10-24

## 2018-10-24 MED ORDER — LOPERAMIDE HCL 2 MG PO CAPS
4.00 | ORAL_CAPSULE | ORAL | Status: DC
Start: ? — End: 2018-10-24

## 2018-10-25 ENCOUNTER — Ambulatory Visit: Payer: BLUE CROSS/BLUE SHIELD

## 2018-10-25 ENCOUNTER — Other Ambulatory Visit: Payer: Self-pay | Admitting: Oncology

## 2018-10-25 ENCOUNTER — Telehealth: Payer: Self-pay | Admitting: *Deleted

## 2018-10-25 ENCOUNTER — Ambulatory Visit: Payer: BLUE CROSS/BLUE SHIELD | Admitting: Internal Medicine

## 2018-10-25 DIAGNOSIS — R51 Headache: Secondary | ICD-10-CM | POA: Diagnosis not present

## 2018-10-25 DIAGNOSIS — Z5111 Encounter for antineoplastic chemotherapy: Secondary | ICD-10-CM | POA: Diagnosis not present

## 2018-10-25 DIAGNOSIS — C9000 Multiple myeloma not having achieved remission: Secondary | ICD-10-CM | POA: Diagnosis not present

## 2018-10-25 LAB — CSF CULTURE W GRAM STAIN

## 2018-10-25 LAB — CSF CULTURE: CULTURE: NO GROWTH

## 2018-10-25 MED ORDER — DEXAMETHASONE 4 MG PO TABS
40.00 | ORAL_TABLET | ORAL | Status: DC
Start: 2018-10-26 — End: 2018-10-25

## 2018-10-25 MED ORDER — ONDANSETRON 4 MG PO TBDP
4.00 | ORAL_TABLET | ORAL | Status: DC
Start: ? — End: 2018-10-25

## 2018-10-25 NOTE — Telephone Encounter (Signed)
Called patient and he agrees to labs on 12/23 at 0830 and to sign paperwork for pomalyst. Also informed him of appointment for his 1st infusion on 10/29/18.

## 2018-10-25 NOTE — Progress Notes (Signed)
  Radiation Oncology         ([phone]) [phone] ________________________________  Name: Paul Green MRN: [id number]  Date: 10/17/2018  DOB: [birth year]  SIMULATION AND TREATMENT PLANNING NOTE  DIAGNOSIS:     ICD-10-CM   1. Multiple myeloma not having achieved remission ([place]) C90.00      Site: Cervical spine  NARRATIVE:  The patient was brought to the [place] [facility].  Identity was confirmed.  All relevant records and images related to the planned course of therapy were reviewed.   Written consent to proceed with treatment was confirmed which was freely given after reviewing the details related to the planned course of therapy had been reviewed with the patient.  Then, the patient was set-up in a stable reproducible  supine position for radiation therapy.  CT images were obtained.  Surface markings were placed.    Medically necessary complex treatment device(s) for immobilization:   1.  Thermoplastic mask 2.  Accu form device.   The CT images were loaded into the planning software.  Then the target and avoidance structures were contoured.  Treatment planning then occurred.  The radiation prescription was entered and confirmed.  A total of 3 complex treatment devices were fabricated which relate to the designed radiation treatment fields. Each of these customized fields/ complex treatment devices will be used on a daily basis during the radiation course. I have requested : 3D Simulation  I have requested a DVH of the following structures: Target volume, spinal cord, left parotid gland, right parotid gland.   PLAN:  The patient will receive 30 Gy in 12 fractions.  Special treatment procedure The patient will also receive concurrent chemotherapy during the treatment. The patient may therefore experience increased toxicity or side effects and the patient will be monitored for such problems. This may require extra lab work as necessary. This therefore constitutes a special treatment  procedure.   ________________________________   Jodelle Gross, MD, PhD

## 2018-10-25 NOTE — Telephone Encounter (Signed)
Oral Oncology Patient Advocate Encounter  Prior Authorization for Pomalyst has been approved.    PA# [id number] Effective dates: 10/23/18 through 10/22/19  Oral Oncology Clinic will continue to follow.   [place] Patient [place] Phone [phone] Fax [phone]

## 2018-10-26 LAB — ANAEROBIC CULTURE

## 2018-10-27 ENCOUNTER — Other Ambulatory Visit: Payer: Self-pay | Admitting: Oncology

## 2018-10-28 ENCOUNTER — Other Ambulatory Visit: Payer: Self-pay

## 2018-10-28 ENCOUNTER — Encounter (HOSPITAL_COMMUNITY): Payer: Self-pay | Admitting: Emergency Medicine

## 2018-10-28 ENCOUNTER — Inpatient Hospital Stay: Payer: BLUE CROSS/BLUE SHIELD

## 2018-10-28 ENCOUNTER — Telehealth: Payer: Self-pay | Admitting: *Deleted

## 2018-10-28 ENCOUNTER — Emergency Department (HOSPITAL_COMMUNITY): Payer: BLUE CROSS/BLUE SHIELD

## 2018-10-28 ENCOUNTER — Emergency Department (HOSPITAL_COMMUNITY)
Admission: EM | Admit: 2018-10-28 | Discharge: 2018-10-28 | Disposition: A | Discharge status: Home / Self Care | Payer: BLUE CROSS/BLUE SHIELD | Attending: Emergency Medicine | Admitting: Emergency Medicine

## 2018-10-28 ENCOUNTER — Other Ambulatory Visit: Payer: Self-pay | Admitting: Pharmacist

## 2018-10-28 ENCOUNTER — Ambulatory Visit: Payer: BLUE CROSS/BLUE SHIELD

## 2018-10-28 DIAGNOSIS — F419 Anxiety disorder, unspecified: Secondary | ICD-10-CM | POA: Insufficient documentation

## 2018-10-28 DIAGNOSIS — R079 Chest pain, unspecified: Secondary | ICD-10-CM

## 2018-10-28 DIAGNOSIS — R0789 Other chest pain: Secondary | ICD-10-CM | POA: Insufficient documentation

## 2018-10-28 DIAGNOSIS — C9000 Multiple myeloma not having achieved remission: Secondary | ICD-10-CM

## 2018-10-28 DIAGNOSIS — I10 Essential (primary) hypertension: Secondary | ICD-10-CM | POA: Insufficient documentation

## 2018-10-28 DIAGNOSIS — D649 Anemia, unspecified: Secondary | ICD-10-CM

## 2018-10-28 DIAGNOSIS — R0609 Other forms of dyspnea: Secondary | ICD-10-CM | POA: Diagnosis not present

## 2018-10-28 DIAGNOSIS — R000 Tachycardia, unspecified: Secondary | ICD-10-CM | POA: Diagnosis not present

## 2018-10-28 DIAGNOSIS — J9811 Atelectasis: Secondary | ICD-10-CM | POA: Diagnosis not present

## 2018-10-28 DIAGNOSIS — Z79899 Other long term (current) drug therapy: Secondary | ICD-10-CM | POA: Insufficient documentation

## 2018-10-28 HISTORY — DX: Abnormality of globulin: R77.1

## 2018-10-28 HISTORY — DX: Secondary malignant neoplasm of other parts of nervous system: C79.49

## 2018-10-28 HISTORY — DX: Other pancytopenia: D61.818

## 2018-10-28 HISTORY — DX: Collapsed vertebra, not elsewhere classified, site unspecified, initial encounter for fracture: M48.50XA

## 2018-10-28 LAB — CBC WITH DIFFERENTIAL (CANCER CENTER ONLY)
Abs Immature Granulocytes: 0.18 10*3/uL — ABNORMAL HIGH (ref 0.00–0.07)
Basophils Absolute: 0 10*3/uL (ref 0.0–0.1)
Basophils Relative: 0 %
EOS PCT: 0 %
Eosinophils Absolute: 0 10*3/uL (ref 0.0–0.5)
HCT: 37.1 % — ABNORMAL LOW (ref 39.0–52.0)
Hemoglobin: 12.7 g/dL — ABNORMAL LOW (ref 13.0–17.0)
Immature Granulocytes: 2 %
Lymphocytes Relative: 3 %
Lymphs Abs: 0.3 10*3/uL — ABNORMAL LOW (ref 0.7–4.0)
MCH: 32 pg (ref 26.0–34.0)
MCHC: 34.2 g/dL (ref 30.0–36.0)
MCV: 93.5 fL (ref 80.0–100.0)
Monocytes Absolute: 0.5 10*3/uL (ref 0.1–1.0)
Monocytes Relative: 4 %
Neutro Abs: 11 10*3/uL — ABNORMAL HIGH (ref 1.7–7.7)
Neutrophils Relative %: 91 %
Platelet Count: 214 10*3/uL (ref 150–400)
RBC: 3.97 MIL/uL — AB (ref 4.22–5.81)
RDW: 15.2 % (ref 11.5–15.5)
WBC: 12 10*3/uL — AB (ref 4.0–10.5)
nRBC: 0 % (ref 0.0–0.2)

## 2018-10-28 LAB — BASIC METABOLIC PANEL
Anion gap: 13 (ref 5–15)
BUN: 27 mg/dL — ABNORMAL HIGH (ref 6–20)
CO2: 24 mmol/L (ref 22–32)
Calcium: 9.6 mg/dL (ref 8.9–10.3)
Chloride: 100 mmol/L (ref 98–111)
Creatinine, Ser: 1.07 mg/dL (ref 0.61–1.24)
GFR calc Af Amer: >60 mL/min (ref >60)
Glucose, Bld: 146 mg/dL — ABNORMAL HIGH (ref 70–99)
Potassium: 4.2 mmol/L (ref 3.5–5.1)
Sodium: 137 mmol/L (ref 135–145)

## 2018-10-28 LAB — CMP (CANCER CENTER ONLY)
ALK PHOS: 79 U/L (ref 38–126)
ALT: 194 U/L — ABNORMAL HIGH (ref 0–44)
AST: 88 U/L — ABNORMAL HIGH (ref 15–41)
Albumin: 3.8 g/dL (ref 3.5–5.0)
Anion gap: 10 (ref 5–15)
BILIRUBIN TOTAL: 0.5 mg/dL (ref 0.3–1.2)
BUN: 25 mg/dL — ABNORMAL HIGH (ref 6–20)
CO2: 25 mmol/L (ref 22–32)
Calcium: 9.9 mg/dL (ref 8.9–10.3)
Chloride: 101 mmol/L (ref 98–111)
Creatinine: 1.18 mg/dL (ref 0.61–1.24)
GFR, Est AFR Am: >60 mL/min (ref >60)
GFR, Estimated: >60 mL/min (ref >60)
Glucose, Bld: 193 mg/dL — ABNORMAL HIGH (ref 70–99)
Potassium: 4.1 mmol/L (ref 3.5–5.1)
Sodium: 136 mmol/L (ref 135–145)
TOTAL PROTEIN: 6.7 g/dL (ref 6.5–8.1)

## 2018-10-28 LAB — TYPE AND SCREEN
ABO/RH(D): AB NEG
Antibody Screen: NEGATIVE

## 2018-10-28 LAB — I-STAT TROPONIN, ED
TROPONIN I, POC: 0 ng/mL (ref 0.00–0.08)
Troponin i, poc: 0 ng/mL (ref 0.00–0.08)

## 2018-10-28 LAB — CBC
HCT: 37 % — ABNORMAL LOW (ref 39.0–52.0)
Hemoglobin: 12 g/dL — ABNORMAL LOW (ref 13.0–17.0)
MCH: 30.5 pg (ref 26.0–34.0)
MCHC: 32.4 g/dL (ref 30.0–36.0)
MCV: 93.9 fL (ref 80.0–100.0)
NRBC: 0 % (ref 0.0–0.2)
Platelets: 209 10*3/uL (ref 150–400)
RBC: 3.94 MIL/uL — ABNORMAL LOW (ref 4.22–5.81)
RDW: 15.1 % (ref 11.5–15.5)
WBC: 10.2 10*3/uL (ref 4.0–10.5)

## 2018-10-28 LAB — ABO/RH: ABO/RH(D): AB NEG

## 2018-10-28 LAB — PRETREATMENT RBC PHENOTYPE

## 2018-10-28 MED ORDER — NITROGLYCERIN 0.4 MG SL SUBL
0.4000 mg | SUBLINGUAL_TABLET | SUBLINGUAL | Status: DC | PRN
  Administered 2018-10-28: 0.4 mg via SUBLINGUAL

## 2018-10-28 MED ORDER — IOPAMIDOL (ISOVUE-370) INJECTION 76%
100.0000 mL | Freq: Once | INTRAVENOUS | Status: AC | PRN
  Administered 2018-10-28: 100 mL via INTRAVENOUS

## 2018-10-28 MED ORDER — POMALIDOMIDE 4 MG PO CAPS: 4.0000 mg | ORAL_CAPSULE | Freq: Every day | ORAL | 0 refills | Status: DC

## 2018-10-28 MED ORDER — HYDROMORPHONE HCL 1 MG/ML IJ SOLN
1.0000 mg | Freq: Once | INTRAMUSCULAR | Status: AC
  Administered 2018-10-28: 1 mg via INTRAVENOUS
  Filled 2018-10-28: qty 1

## 2018-10-28 MED ORDER — HYDROMORPHONE HCL 2 MG PO TABS: 2.0000 mg | ORAL_TABLET | ORAL | 0 refills | Status: DC | PRN

## 2018-10-28 MED ORDER — ONDANSETRON HCL 4 MG/2ML IJ SOLN: 4.0000 mg | Freq: Once | INTRAMUSCULAR | Status: DC

## 2018-10-28 MED ORDER — POMALIDOMIDE 4 MG PO CAPS: 4.0000 mg | ORAL_CAPSULE | Freq: Every day | ORAL | 0 refills | Status: AC

## 2018-10-28 MED ORDER — DEXAMETHASONE 4 MG PO TABS: ORAL_TABLET | ORAL | 0 refills | Status: AC

## 2018-10-28 NOTE — Telephone Encounter (Signed)
Oral Oncology Pharmacist Encounter  Received Pomalyst 4 mg prescription from collaborative practice RN. Patient [employer] authorization has been updated with treatment change from Revlimid to Pomalyst, and new [employer] authorization number has been obtained.  Prescription for Pomalyst 4 mg capsules, take 1 capsule by mouth once daily, for 21 days on, 7 days off, repeated every 28 days has been E scribed to [facility] as this is where patient was receiving his Revlimid.  I called and left voicemail for patient's wife, Gwenette Greet, with above information, and offer to perform initial counseling. I will attempt to provide check-in and initial counseling when patient is in infusion tomorrow (10/29/2018) for his first daratumumab infusion.  Johny Drilling, PharmD, BCPS, BCOP  10/28/2018 11:20 AM Oral Oncology Clinic [phone]

## 2018-10-28 NOTE — Telephone Encounter (Signed)
Called patient back and informed him he can take dilaudid 2-4 mg every 4 hours as needed and he will be provided a new script tomorrow. Per Dr. Benay Spice, he can hold his prednisone dose today and Palliative Team referral is OK. Will fax referral today. Sent email to managed care to follow up on the approval of the daratumuub.  Sent in signed [place] documents to [place] and obtained authorization # [id number] for his 1st script of Pomalyist 4mg  taking one daily every 21 days with 7 day rest. Script forwarded to Johny Drilling, PharmD.

## 2018-10-28 NOTE — ED Triage Notes (Signed)
Pt transported from home by EMS c/o substernal chest pressure onset 1.5 hours ago, pt states he got up from sleep to use the bathroom felt heaviness across chest and up to throat. Denies [facility], denies n/v/d.  Pt took ASA 81mg  x 4 at home, pt is active chemo patient, MM and MM meningitis.

## 2018-10-28 NOTE — ED Notes (Signed)
Pt now yelling out in pain, pt stating nitro caused pain to be much worse.  MD aware.

## 2018-10-28 NOTE — ED Provider Notes (Signed)
[facility] EMERGENCY DEPARTMENT Provider Note   CSN: [id number] Arrival date & time: 10/28/18  5537     History   Chief Complaint Chief Complaint  Patient presents with  . Chest Pain    HPI Paul Green is a 57 y.o. male.  The history is provided by the patient.  Chest Pain    He has history of multiple myeloma, hypertension, hyperlipidemia and comes in complaining of chest pain.  He states that at about 2 AM, he got up from sleep to go to urinate and noted pressure feeling like someone sitting on his chest.  Discomfort was rated at 8/10.  It did seem to be worse if he lay flat, but was not affected by exertion or deep breathing.  There is no associated dyspnea but there was some mild nausea.  There was no diaphoresis.  He has not had discomfort like this before.  He did take aspirin at home prior to coming to the ED.  He is a non-smoker and there is no family history of premature coronary atherosclerosis.  Past Medical History:  Diagnosis Date  . Multiple myeloma ([place]) 01/2018    Patient Active Problem List   Diagnosis Date Noted  . Right facial numbness 10/18/2018  . Hyponatremia 10/18/2018  . Left shoulder pain 10/18/2018  . Hypertriglyceridemia 05/30/2018  . Metabolic syndrome 48/27/0786  . Essential hypertension 05/30/2018  . Allergic rhinitis 02/18/2018  . Goals of care, counseling/discussion 01/29/2018  . Multiple myeloma ([place]) 01/28/2018  . Multiple myeloma not having achieved remission ([place]) 01/28/2018  . Hypercalcemia 01/23/2018  . AKI (acute kidney injury) ([place]) 01/23/2018  . Back pain 01/23/2018  . Closed compression fracture of L1 lumbar vertebra 01/23/2018    Past Surgical History:  Procedure Laterality Date  . [place] Medications    Prior to Admission medications   Medication Sig Start Date End Date Taking? Authorizing Provider  acetaminophen (TYLENOL) 500 MG tablet Take 1,000 mg by mouth every 6 (six)  hours as needed for fever or headache.    [provider]  acyclovir (ZOVIRAX) 400 MG tablet Take 2 tablets (800 mg total) by mouth 3 (three) times daily. 10/19/18   Bonnielee Haff, MD  amLODipine (NORVASC) 5 MG tablet Take 1 tablet (5 mg total) by mouth daily. 07/29/18 10/27/18  Buford Dresser, MD  aspirin 81 MG chewable tablet Chew 1 tablet (81 mg total) by mouth daily. 01/27/18   Mariel Aloe, MD  Bortezomib (VELCADE IJ) Inject 1.75 mg as directed. On 3 weeks on 1 week off    [provider]  Dexamethasone (DECADRON PO) Take 40 mg by mouth once. On 3 weeks off 1 week     [provider]  docusate sodium (COLACE) 100 MG capsule Take 100 mg by mouth 2 (two) times daily.    [provider]  oxyCODONE (OXY IR/ROXICODONE) 5 MG immediate release tablet Take 5 mg by mouth every 6 (six) hours as needed for severe pain.    [provider]  Polyethylene Glycol 3350 (MIRALAX PO) Take 17 g by mouth daily.    [provider]  prochlorperazine (COMPAZINE) 10 MG tablet Take 10 mg by mouth. On 3 weeks off 1 week    [provider]  Zoledronic Acid 4 MG SOLR Inject 4 mg into the vein every 3 (three) months.     [provider]    Family History Family History  Problem Relation Age of Onset  . Skin cancer Mother   . Atrial fibrillation Mother   . Atrial fibrillation Father   . Melanoma Father   . Diabetes Sister   . Melanoma Brother   . Stroke Other   . CAD Neg Hx     Social History Social History   Tobacco Use  . Smoking status: Never Smoker  . Smokeless tobacco: Never Used  Substance Use Topics  . Alcohol use: Yes    Comment: occ  . Drug use: Never     Allergies   Methocarbamol   Review of Systems Review of Systems  Cardiovascular: Positive for chest pain.  All other systems reviewed and are negative.    Physical Exam Updated Vital Signs BP (!) 157/103 (BP Location: Right Arm)   Pulse 96   Temp  (!) 97.4 F (36.3 C) (Oral)   Resp (!) 24   Ht '5\' 6"'  (1.676 m)   Wt 83.9 kg   SpO2 99%   BMI 29.86 kg/m   Physical Exam Vitals signs and nursing note reviewed.    57 year old male, appears uncomfortable, but is in no acute distress. Vital signs are significant for elevated blood pressure and respiratory rate. Oxygen saturation is 99%, which is normal. Head is normocephalic and atraumatic. PERRLA, EOMI. Oropharynx is clear. Neck is nontender and supple without adenopathy or JVD. Back is nontender and there is no CVA tenderness. Lungs are clear without rales, wheezes, or rhonchi. Chest is nontender. Heart has regular rate and rhythm without murmur. Abdomen is soft, flat, nontender without masses or hepatosplenomegaly and peristalsis is normoactive. Extremities have no cyanosis or edema, full range of motion is present. Skin is warm and dry without rash. Neurologic: Mental status is normal, cranial nerves are intact, there are no motor or sensory deficits.  ED Treatments / Results  Labs (all labs ordered are listed, but only abnormal results are displayed) Labs Reviewed  BASIC METABOLIC PANEL - Abnormal; Notable for the following components:      Result Value   Glucose, Bld 146 (*)    BUN 27 (*)    All other components within normal limits  CBC - Abnormal; Notable for the following components:   RBC 3.94 (*)    Hemoglobin 12.0 (*)    HCT 37.0 (*)    All other components within normal limits  I-STAT TROPONIN, ED  I-STAT TROPONIN, ED    EKG EKG Interpretation  Date/Time:  Monday October 28 2018 03:47:15 EST Ventricular Rate:  91 PR Interval:    QRS Duration: 100 QT Interval:  376 QTC Calculation: 463 R Axis:   -31 Text Interpretation:  Sinus rhythm Left axis deviation Low voltage, precordial leads RSR' in V1 or V2, probably normal variant When compared with ECG of 04/09/2018, No significant change was found Confirmed by Delora Fuel ([id number]) on 10/28/2018 3:59:56  AM   Radiology Dg Chest 2 View  Result Date: 10/28/2018 CLINICAL DATA:  Acute onset of substernal chest pain and heaviness. EXAM: CHEST - 2 VIEW COMPARISON:  Chest radiograph performed 10/11/2018 FINDINGS: The lungs are well-aerated. Mild left basilar atelectasis is noted. There is no evidence of pleural effusion or pneumothorax. The heart is borderline normal in size. No acute osseous abnormalities are seen. IMPRESSION: Mild left basilar atelectasis noted. Lungs otherwise clear. Electronically Signed   By: Garald Balding M.D.   On: 10/28/2018 04:20    Procedures Procedures  Medications Ordered in ED Medications  nitroGLYCERIN (NITROSTAT) SL  tablet 0.4 mg (0.4 mg Sublingual Given 10/28/18 0425)     Initial Impression / Assessment and Plan / ED Course  I have reviewed the triage vital signs and the nursing notes.  Pertinent labs & imaging results that were available during my care of the patient were reviewed by me and considered in my medical decision making (see chart for details).  Chest discomfort of uncertain cause.  Will give therapeutic trial of nitroglycerin.  If he does not get relief, will use hydromorphone for pain since he has been taking that because of cancer related pain.  ECG shows no acute changes and chest x-ray is unremarkable.  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  4:34 AM Pain actually got worse after given nitroglycerin.  He also is now complaining of dyspnea.  He is very anxious.  He is given hydromorphone for pain.  He had excellent relief of both pain and dyspnea and also anxiety with hydromorphone.  I have recommended short-term hospital stay to monitor his respiratory status, but he is anxious to go home.  He was kept in the ED for delta troponin which is normal.  He has not had any further problems with dyspnea or anxiety.   He is discharged with instructions to take hydromorphone at home as needed, return if symptoms are not being adequately controlled.  He is supposed to have blood drawn this morning at the cancer center.  I did try to see if the blood could be drawn here to save him an extra venipuncture, but was unable to accomplish that.  Final Clinical Impressions(s) / ED Diagnoses   Final diagnoses:  Nonspecific chest pain  Multiple myeloma not having achieved remission (H[facility])  Normochromic normocytic anemia    ED Discharge Orders    None       Delora Fuel, MD 98/24/29 [phone]

## 2018-10-28 NOTE — Telephone Encounter (Signed)
Patient was provided education materials on [employer] and theses were briefly reviewed by nurse. Oral chemotherapy pharmacist will be calling patient/wife later today. Informed him that he will be taking Decadron 20 mg on day after each IV treatment. They are asking if he needs to take his last 40 mg dose prescribed by [facility] (due today)? Asking for refill on his dilaudid and if he can take it more frequently when he has a pain crisis as he did last night? Wife is also requesting Palliative Team referral. Asking for status of insurance approval of the treatment tomorrow?

## 2018-10-28 NOTE — ED Notes (Signed)
Dr. Glick at bedside.  

## 2018-10-28 NOTE — Discharge Instructions (Addendum)
If pain comes back, take your hydromorphone (Dilaudid). If you are not getting adequate pain relief, you can return to the emergency department.  Go to the cancer center to have your blood drawn.

## 2018-10-29 ENCOUNTER — Other Ambulatory Visit: Payer: Self-pay | Admitting: Oncology

## 2018-10-29 ENCOUNTER — Other Ambulatory Visit: Payer: Self-pay | Admitting: *Deleted

## 2018-10-29 ENCOUNTER — Inpatient Hospital Stay (HOSPITAL_BASED_OUTPATIENT_CLINIC_OR_DEPARTMENT_OTHER): Payer: BLUE CROSS/BLUE SHIELD | Admitting: Oncology

## 2018-10-29 ENCOUNTER — Inpatient Hospital Stay: Payer: BLUE CROSS/BLUE SHIELD

## 2018-10-29 ENCOUNTER — Ambulatory Visit: Payer: BLUE CROSS/BLUE SHIELD

## 2018-10-29 ENCOUNTER — Encounter: Payer: Self-pay | Admitting: *Deleted

## 2018-10-29 ENCOUNTER — Other Ambulatory Visit: Payer: BLUE CROSS/BLUE SHIELD

## 2018-10-29 VITALS — BP 129/92 | HR 113 | Temp 97.9°F | Resp 18

## 2018-10-29 DIAGNOSIS — C9000 Multiple myeloma not having achieved remission: Secondary | ICD-10-CM

## 2018-10-29 DIAGNOSIS — M25512 Pain in left shoulder: Secondary | ICD-10-CM

## 2018-10-29 DIAGNOSIS — H532 Diplopia: Secondary | ICD-10-CM | POA: Diagnosis not present

## 2018-10-29 DIAGNOSIS — Z79899 Other long term (current) drug therapy: Secondary | ICD-10-CM

## 2018-10-29 DIAGNOSIS — G038 Meningitis due to other specified causes: Secondary | ICD-10-CM

## 2018-10-29 MED ORDER — SODIUM CHLORIDE 0.9 % IV SOLN
16.2000 mg/kg | Freq: Once | INTRAVENOUS | Status: AC
  Administered 2018-10-29: 1400 mg via INTRAVENOUS
  Filled 2018-10-29: qty 60

## 2018-10-29 MED ORDER — HYDROMORPHONE HCL 1 MG/ML IJ SOLN
1.0000 mg | Freq: Once | INTRAMUSCULAR | Status: AC
  Administered 2018-10-29: 1 mg via INTRAVENOUS

## 2018-10-29 MED ORDER — DIPHENHYDRAMINE HCL 25 MG PO CAPS
50.0000 mg | ORAL_CAPSULE | Freq: Once | ORAL | Status: AC
  Administered 2018-10-29: 50 mg via ORAL

## 2018-10-29 MED ORDER — MONTELUKAST SODIUM 10 MG PO TABS
ORAL_TABLET | ORAL | Status: AC
  Filled 2018-10-29: qty 1

## 2018-10-29 MED ORDER — HYDROMORPHONE HCL 1 MG/ML IJ SOLN
INTRAMUSCULAR | Status: AC
  Filled 2018-10-29: qty 1

## 2018-10-29 MED ORDER — SODIUM CHLORIDE 0.9 % IV SOLN
20.0000 mg | Freq: Once | INTRAVENOUS | Status: AC
  Administered 2018-10-29: 20 mg via INTRAVENOUS
  Filled 2018-10-29: qty 2

## 2018-10-29 MED ORDER — ACETAMINOPHEN 325 MG PO TABS
650.0000 mg | ORAL_TABLET | Freq: Once | ORAL | Status: AC
  Administered 2018-10-29: 650 mg via ORAL

## 2018-10-29 MED ORDER — DIPHENHYDRAMINE HCL 25 MG PO CAPS
ORAL_CAPSULE | ORAL | Status: AC
  Filled 2018-10-29: qty 2

## 2018-10-29 MED ORDER — ACETAMINOPHEN 325 MG PO TABS
ORAL_TABLET | ORAL | Status: AC
  Filled 2018-10-29: qty 2

## 2018-10-29 MED ORDER — SODIUM CHLORIDE 0.9 % IV SOLN
Freq: Once | INTRAVENOUS | Status: AC
  Administered 2018-10-29: 09:00:00 via INTRAVENOUS
  Filled 2018-10-29: qty 250

## 2018-10-29 MED ORDER — ALPRAZOLAM 0.5 MG PO TABS: 0.5000 mg | ORAL_TABLET | Freq: Three times a day (TID) | ORAL | 0 refills | Status: AC | PRN

## 2018-10-29 MED ORDER — MONTELUKAST SODIUM 10 MG PO TABS
10.0000 mg | ORAL_TABLET | Freq: Once | ORAL | Status: AC
  Administered 2018-10-29: 10 mg via ORAL

## 2018-10-29 NOTE — Progress Notes (Signed)
Per Dr. Benay Spice ok to treat with elevated BP and Heart Rate.

## 2018-10-29 NOTE — Telephone Encounter (Signed)
Oral Chemotherapy Pharmacist Encounter   I spoke with patient and spouse, Paul Green, in infusion room for overview of: Pomalyst (pomalidomide) for the treatment of relapsed multiple myeloma in conjunction with daratumumab and dexamethasone, planned duration until disease progression or unacceptable toxicity.   Counseled patient on administration, dosing, side effects, monitoring, drug-food interactions, safe handling, storage, and disposal.  Patient will take Pomalyst 13m capsules, 1 capsule by mouth once daily, without regard to food, with a full glass of water.  Pomalyst will be given 21 days on, 7 days off, repeat every 28 days.  Patient will take dexamethasone 430mtablets, 5 tablets (2059mby mouth once weekly with breakfast, the day after daratumumab infusion.  Daratumumab will be infused at 16 mg/kg once weekly x 8 doses, then once every 2 weeks x 8 doses, then once every 4 weeks until discontinuation.  Pomalyst start date: TBD, pending medication acquisition Daratumumab start date: 10/29/2018  Adverse effects of Pomalyst include but are not limited to: nausea, constipation, diarrhea, abdominal pain, rash, fatigue, drug fever, peripheral edema, and decreased blood counts.    Reviewed with patient importance of keeping a medication schedule and plan for any missed doses.  Medication reconciliation performed and medication/allergy list updated.  Patient continues on acyclovir. Dexamethasone prescription [facility] to [facility] 10/28/[facility]. MauGwenette Greetll pick this up today. Patient counseled on importance of daily aspirin 30m36mr VTE prophylaxis.  Patient continues in a lot of pain. He is not really sleeping. We extensively discussed pain control.  They will increase PO hydromorphone to 4mg 26m dose as 2mg p62mdose is not adequately treating patient's pain. Patient will be administered IV hydromorphone 1mg du22mg infusion today for pain control.  Discussed prescription  for anti-anxiolytic with patient and MD. Prescription for alprazolam has been phoned in to patient's pharmacy by collaborative practice RN.  Palliative care referral has been placed through Hospice. MaureenGwenette Greeted of onboarding process for palliative care services. They will await call to schedule home visit.  I will follow-up with [facility] later today for status of Pomalyst prescription.  All questions answered.  [name] voiced understanding and appreciation.   They know to call the office with questions or concerns. Oral Oncology Clinic will continue to follow.  Jesse MJohny DrillingD, BCPS, BCOP  10/29/2018   10:08 AM Oral Oncology Clinic [phone]

## 2018-10-29 NOTE — Patient Instructions (Signed)
[facility] Discharge Instructions for Patients Receiving Chemotherapy  Today you received the following chemotherapy agents Daratumumab  To help prevent nausea and vomiting after your treatment, we encourage you to take your nausea medication as prescribed.   If you develop nausea and vomiting that is not controlled by your nausea medication, call the clinic.   BELOW ARE SYMPTOMS THAT SHOULD BE REPORTED IMMEDIATELY:  *FEVER GREATER THAN 100.5 F  *CHILLS WITH OR WITHOUT FEVER  NAUSEA AND VOMITING THAT IS NOT CONTROLLED WITH YOUR NAUSEA MEDICATION  *UNUSUAL SHORTNESS OF BREATH  *UNUSUAL BRUISING OR BLEEDING  TENDERNESS IN MOUTH AND THROAT WITH OR WITHOUT PRESENCE OF ULCERS  *URINARY PROBLEMS  *BOWEL PROBLEMS  UNUSUAL RASH Items with * indicate a potential emergency and should be followed up as soon as possible.  Feel free to call the clinic should you have any questions or concerns. The clinic phone number is [phone].  Please show the [facility] at check-in to the Emergency Department and triage nurse.  Daratumumab injection What is this medicine? DARATUMUMAB (dar a toom ue mab) is a monoclonal antibody. It is used to treat multiple myeloma. This medicine may be used for other purposes; ask your health care provider or pharmacist if you have questions. COMMON BRAND NAME(S): DARZALEX What should I tell my health care provider before I take this medicine? They need to know if you have any of these conditions: -infection (especially a virus infection such as chickenpox, herpes, or hepatitis B virus) -lung or breathing disease -an unusual or allergic reaction to daratumumab, other medicines, foods, dyes, or preservatives -pregnant or trying to get pregnant -breast-feeding How should I use this medicine? This medicine is for infusion into a vein. It is given by a health care professional in a hospital or clinic setting. Talk to your pediatrician  regarding the use of this medicine in children. Special care may be needed. Overdosage: If you think you have taken too much of this medicine contact a poison control center or emergency room at once. NOTE: This medicine is only for you. Do not share this medicine with others. What if I miss a dose? Keep appointments for follow-up doses as directed. It is important not to miss your dose. Call your doctor or health care professional if you are unable to keep an appointment. What may interact with this medicine? Interactions have not been studied. Give your health care provider a list of all the medicines, herbs, non-prescription drugs, or dietary supplements you use. Also tell them if you smoke, drink alcohol, or use illegal drugs. Some items may interact with your medicine. This list may not describe all possible interactions. Give your health care provider a list of all the medicines, herbs, non-prescription drugs, or dietary supplements you use. Also tell them if you smoke, drink alcohol, or use illegal drugs. Some items may interact with your medicine. What should I watch for while using this medicine? This drug may make you feel generally unwell. Report any side effects. Continue your course of treatment even though you feel ill unless your doctor tells you to stop. This medicine can cause serious allergic reactions. To reduce your risk you may need to take medicine before treatment with this medicine. Take your medicine as directed. This medicine can affect the results of blood tests to match your blood type. These changes can last for up to 6 months after the final dose. Your healthcare provider will do blood tests to match your blood  type before you start treatment. Tell all of your healthcare providers that you are being treated with this medicine before receiving a blood transfusion. This medicine can affect the results of some tests used to determine treatment response; extra tests may be  needed to evaluate response. Do not become pregnant while taking this medicine or for 3 months after stopping it. Women should inform their doctor if they wish to become pregnant or think they might be pregnant. There is a potential for serious side effects to an unborn child. Talk to your health care professional or pharmacist for more information. What side effects may I notice from receiving this medicine? Side effects that you should report to your doctor or health care professional as soon as possible: -allergic reactions like skin rash, itching or hives, swelling of the face, lips, or tongue -breathing problems -chills -cough -dizziness -feeling faint or lightheaded -headache -low blood counts - this medicine may decrease the number of white blood cells, red blood cells and platelets. You may be at increased risk for infections and bleeding. -nausea, vomiting -shortness of breath -signs of decreased platelets or bleeding - bruising, pinpoint red spots on the skin, black, tarry stools, blood in the urine -signs of decreased red blood cells - unusually weak or tired, feeling faint or lightheaded, falls -signs of infection - fever or chills, cough, sore throat, pain or difficulty passing urine -signs and symptoms of liver injury like dark yellow or brown urine; general ill feeling or flu-like symptoms; light-colored stools; loss of appetite; right upper belly pain; unusually weak or tired; yellowing of the eyes or skin Side effects that usually do not require medical attention (report to your doctor or health care professional if they continue or are bothersome): -back pain -constipation -loss of appetite -diarrhea -joint pain -muscle cramps -pain, tingling, numbness in the hands or feet -swelling of the ankles, feet, hands -tiredness -trouble sleeping This list may not describe all possible side effects. Call your doctor for medical advice about side effects. You may report side  effects to FDA at 1-800-FDA-1088. Where should I keep my medicine? Keep out of the reach of children. This drug is given in a hospital or clinic and will not be stored at home. NOTE: This sheet is a summary. It may not cover all possible information. If you have questions about this medicine, talk to your doctor, pharmacist, or health care provider.  2019 Elsevier/Gold Standard (2018-05-24 15:52:44)

## 2018-10-29 NOTE — Progress Notes (Signed)
[place] OFFICE PROGRESS NOTE   Diagnosis: Multiple myeloma  INTERVAL HISTORY:   Mr. Lalone returns for a scheduled visit.  He was admitted to [facility] 10/23/2018.  He received intrathecal methotrexate 10/24/2018.  A repeat CSF cytology confirmed involvement with a plasma cell neoplasm.  He reports tolerating the methotrexate well. A PET scan 10/24/2018 revealed multifocal bone lesions and soft tissue lesions at the left shoulder and right sixth rib. He completed 3 days of pulse Decadron beginning 10/25/2018.  Results from a bone marrow biopsy on 10/25/2018 are pending.  He is here today to begin daratumumab therapy.  He continues to have diplopia, pain at the left shoulder, and he has developed pain at the chin.   Objective:  Vital signs in last 24 hours:  There were no vitals taken for this visit.    HEENT: No thrush Resp: Lungs clear bilaterally Cardio: Regular rate and rhythm GI: No hepatosplenomegaly, nontender Vascular: No leg edema Neuro: The face appears symmetric, the motor exam is intact in the upper and lower extremities, right 6th nerve palsy    Lab Results:  Lab Results  Component Value Date   WBC 12.0 (H) 10/28/2018   HGB 12.7 (L) 10/28/2018   HCT 37.1 (L) 10/28/2018   MCV 93.5 10/28/2018   PLT 214 10/28/2018   NEUTROABS 11.0 (H) 10/28/2018    CMP  Lab Results  Component Value Date   NA 136 10/28/2018   K 4.1 10/28/2018   CL 101 10/28/2018   CO2 25 10/28/2018   GLUCOSE 193 (H) 10/28/2018   BUN 25 (H) 10/28/2018   CREATININE 1.18 10/28/2018   CALCIUM 9.9 10/28/2018   PROT 6.7 10/28/2018   ALBUMIN 3.8 10/28/2018   AST 88 (H) 10/28/2018   ALT 194 (H) 10/28/2018   ALKPHOS 79 10/28/2018   BILITOT 0.5 10/28/2018   GFRNONAA >60 10/28/2018   GFRAA >60 10/28/2018    No results found for: CEA1  Lab Results  Component Value Date   INR 1.13 01/25/2018    Imaging:  Dg Chest 2 View  Result Date: 10/28/2018 CLINICAL DATA:   Acute onset of substernal chest pain and heaviness. EXAM: CHEST - 2 VIEW COMPARISON:  Chest radiograph performed 10/11/2018 FINDINGS: The lungs are well-aerated. Mild left basilar atelectasis is noted. There is no evidence of pleural effusion or pneumothorax. The heart is borderline normal in size. No acute osseous abnormalities are seen. IMPRESSION: Mild left basilar atelectasis noted. Lungs otherwise clear. Electronically Signed   By: Garald Balding M.D.   On: 10/28/2018 04:20   Ct Angio Chest Pe W And/or Wo Contrast  Result Date: 10/28/2018 CLINICAL DATA:  Acute onset of generalized chest tightness. EXAM: CT ANGIOGRAPHY CHEST WITH CONTRAST TECHNIQUE: Multidetector CT imaging of the chest was performed using the standard protocol during bolus administration of intravenous contrast. Multiplanar CT image reconstructions and MIPs were obtained to evaluate the vascular anatomy. CONTRAST:  190m ISOVUE-370 IOPAMIDOL (ISOVUE-370) INJECTION 76% COMPARISON:  Chest radiograph performed earlier today at 4:08 a.m. FINDINGS: Cardiovascular:  There is no evidence of pulmonary embolus. The heart is borderline normal in size. The thoracic aorta is grossly unremarkable. The great vessels are within normal limits. Mediastinum/Nodes: The mediastinum is unremarkable in appearance. No mediastinal lymphadenopathy is seen. No pericardial effusion is identified. The visualized portions of the thyroid gland are unremarkable. No axillary lymphadenopathy is seen. The left axillary nodes are asymmetrically prominent though still normal in size range, measuring up to 1.3 cm in size. Lungs/Pleura:  Patchy bilateral atelectasis is noted. No pleural effusion or pneumothorax is seen. No masses are identified. Upper Abdomen: The visualized portions of the liver and spleen are unremarkable. The visualized portions of the gallbladder, pancreas, adrenal glands and kidneys are within normal limits. Musculoskeletal: A few vague lucencies about the  lower thoracic spine may reflect the patient's history of multiple myeloma. There is slight chronic loss of height at vertebral bodies T4 and T5. The asymmetric prominence of the musculature about the left scapula may simply reflect arm positioning. Would correlate for any symptoms about the left shoulder. Review of the MIP images confirms the above findings. IMPRESSION: 1. No evidence of pulmonary embolus. 2. Patchy bilateral atelectasis noted. 3. Asymmetric prominence of the musculature about the left scapula may simply reflect arm positioning. Would correlate for any symptoms about the left shoulder. 4. Mild asymmetric prominence of left axillary nodes, likely still within normal limits. 5. A few vague lucencies about the lower thoracic spine may reflect the patient's history of multiple myeloma. Electronically Signed   By: Garald Balding M.D.   On: 10/28/2018 05:51    Medications: I have reviewed the patient's current medications.   Assessment/Plan: 1. Multiple myeloma   Anemia/thrombocytopenia  Bone marrow biopsy 01/25/2018-hypercellular bone marrow with extensive involvement by plasma cell neoplasm; flow cytometry with a population of cells with plasmacytic differentiation (13%), no monoclonal B-cell population identified, T cells with nonspecific changes.  Cytogenetics-multiple rearrangements, FISH panel negative for cytogenetic abnormalities to be associated with multiple myeloma including loss of p53  01/24/2018 SPEP-noM spike,elevated serum lambda free light chains (432)  Dexamethasone 40 mg daily times 4 days beginning 01/25/2018  Velcade/dexamethasone weekly x3 followed by a 1 week break beginning3/26/2019(weekly dexamethasone initiated 02/05/2018, Revlimid initiated 02/09/2018)  Lambda light chains improved 02/26/2018  Cycle 2 RVD 03/05/2018 (Velcade dose reduced due to neutropenia), day 8 Velcade held and Revlimid discontinued secondary to recurrent severe neutropenia;day 15  Revlimid resumed x1 week, Velcade held;  Revlimid and Velcade placed on hold 04/02/2018 due to persistent neutropenia.  Bone marrow biopsy at [facility] 04/18/2018-hypercellular marrow, no evidence of myeloma  Cycle 3 RVD started 04/23/2018, Revlimid 5 mg dailyfor 14 days beginning 05/01/2018  Cycle 4 RVD 05/22/2018,Revlimid 5 mg daily for 14 days beginning 05/29/2018  Cycle 5 RVD 06/18/2018, Velcade escalated to standard dosing, Revlimid increased to 10 mg  Cycle 6 RVD 9/10/2019but discontinued after day 1 Velcade  Stem cell harvest at [facility] 08/12/2018  Cycle 7 RVD 08/20/2018  Cycle 8 RVD 09/17/2018, day 15 therapy held secondary to patient vacation  Cycle 9 RVD 10/15/2018, treatment discontinued secondary to neurologic symptoms 10/18/2018  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 Hypercalcemia status post pamidronate 01/24/2018, resolved 3. Diffuse abnormal bone marrow signal, compression fracture of L1 4. Back painsecondary to multiple myeloma, compression fracture.Improved. 5. Anorexia/weight loss-improved 6. 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 Severe neutropenia-recurrent after Revlimid resumed 03/19/2018, resolved 8. Bilateral shoulder pain, neck pain, left arm weakness 10/11/2018-MRI cervical spine- abnormal marrow signal at C2 and upper corner of C3, involvement of the transverse and intervertebral foramen, treated myeloma lesion at C4 9. Pneumonia 10/11/2018-treated with Augmentin and azithromycin 10.  Multiple cranial nerve abnormalities  beginning 10/18/2018-lumbar puncture 10/21/2018 consistent with involvement by plasma cell neoplasm      Disposition: Mr. Fotheringham appears unchanged compared to when I saw him last week.  He has been diagnosed with myelomatous meningitis.  He started treatment at [facility] with intrathecal methotrexate and pulsed Decadron.  He plans to continue weekly intrathecal methotrexate at [facility].  I discussed the case with Dr. Amalia Hailey.  He recommends beginning pomalidomide/daratumumab/Decadron.  We reviewed potential toxicities associated with this regimen.  Mr. Carrick agrees to proceed.  He will be treated with week 1 daratumumab today.  He is waiting to receive the pomalidomide.  He will be scheduled for daratumumab and an office visit in 1 week.  30 minutes were spent with the patient today.  The majority of the time was used for counseling and coordination of care.  Betsy Coder, MD  10/29/2018  8:13 AM

## 2018-10-29 NOTE — Progress Notes (Signed)
Wife requesting anti-anxiety medication for patient-anxious and not sleeping.

## 2018-10-29 NOTE — Progress Notes (Signed)
MD review of 10/28/18 labs: OK to treat with elevated AST and ALT.

## 2018-10-29 NOTE — Telephone Encounter (Signed)
Oral Oncology Pharmacist Encounter  I called [facility] specialty pharmacy at [phone] to follow-up on patient's Pomalyst prescription status. Representative stated that they are able to schedule delivery of the Pomalyst whenever they speak with the patient. I called patient's spouse, Gwenette Greet, and delivered above information. She is on her way back to the cancer center at this time and will have her husband call the dispensing pharmacy as soon as she is back with them. I provided contact phone number for dispensing pharmacy.  All questions answered. Mrs. Demarest expressed understanding and appreciation. She knows to call the office with any additional questions or concerns.  Johny Drilling, PharmD, BCPS, BCOP  10/29/2018 2:07 PM Oral Oncology Clinic [phone]

## 2018-10-30 ENCOUNTER — Ambulatory Visit: Payer: BLUE CROSS/BLUE SHIELD

## 2018-10-31 ENCOUNTER — Telehealth: Payer: Self-pay | Admitting: Oncology

## 2018-10-31 ENCOUNTER — Ambulatory Visit: Payer: BLUE CROSS/BLUE SHIELD

## 2018-10-31 ENCOUNTER — Telehealth: Payer: Self-pay | Admitting: *Deleted

## 2018-10-31 DIAGNOSIS — C9000 Multiple myeloma not having achieved remission: Secondary | ICD-10-CM | POA: Diagnosis not present

## 2018-10-31 DIAGNOSIS — C7949 Secondary malignant neoplasm of other parts of nervous system: Secondary | ICD-10-CM | POA: Diagnosis not present

## 2018-10-31 DIAGNOSIS — Z5112 Encounter for antineoplastic immunotherapy: Secondary | ICD-10-CM | POA: Diagnosis not present

## 2018-10-31 DIAGNOSIS — Z7682 Awaiting organ transplant status: Secondary | ICD-10-CM | POA: Diagnosis not present

## 2018-10-31 LAB — PATHOLOGIST SMEAR REVIEW

## 2018-10-31 NOTE — Telephone Encounter (Signed)
Scheduled appt per 12/24 los -pt to get an updated schedule next visit.   

## 2018-10-31 NOTE — Telephone Encounter (Signed)
TCT patient to follow up with him s/p his first Dazalex therapy on 10/29/18. Spoke with his wife.  They are currently in [facility] for IT treatment.  Mrs. Rapozo states that his platelets are low today @ 36k and he cannot have the procedure until his his platelets are up to 50k.   He will be transfused 1 unit of platelets today and then get his counts re-checked. If his platelets are up to 50k then he will have the IT treatment. Otherwise patient feels well. Pain is controlled with hydromorphone every 4 hours. He is on a bowel regime and is not having problems in that regard at this time. They are aware of his follow up appts with Dr. Benay Spice

## 2018-11-01 ENCOUNTER — Telehealth: Payer: Self-pay | Admitting: *Deleted

## 2018-11-01 ENCOUNTER — Ambulatory Visit: Payer: BLUE CROSS/BLUE SHIELD

## 2018-11-01 NOTE — Telephone Encounter (Signed)
Called to request that future intrathecal chemo administrations be done at [facility] due to travel and long days at [facility] for the procedure. Next treatment due 11/07/18 and she understands it may not be able to be done locally that week.  Left VM for wife that request will be forwarded to Dr.Sherrill. Most likely 11/14/18 will be the soonest it can be done here.

## 2018-11-01 NOTE — Telephone Encounter (Signed)
Oral Oncology Patient Advocate Encounter  Confirmed with [facility] that Pomalyst will deliver to the patient today, 11/01/18 with a $0 copay  [place] [facility] [place] Phone [phone] Fax [phone]

## 2018-11-03 NOTE — Telephone Encounter (Signed)
We will need to check cbc/diff on 12/30 and decide when to give next IT MTX, ,platelets will need to recover

## 2018-11-04 ENCOUNTER — Telehealth: Payer: Self-pay

## 2018-11-04 ENCOUNTER — Ambulatory Visit: Payer: BLUE CROSS/BLUE SHIELD

## 2018-11-04 ENCOUNTER — Inpatient Hospital Stay: Payer: BLUE CROSS/BLUE SHIELD

## 2018-11-04 ENCOUNTER — Other Ambulatory Visit: Payer: Self-pay | Admitting: *Deleted

## 2018-11-04 ENCOUNTER — Inpatient Hospital Stay (HOSPITAL_BASED_OUTPATIENT_CLINIC_OR_DEPARTMENT_OTHER): Payer: BLUE CROSS/BLUE SHIELD | Admitting: Oncology

## 2018-11-04 VITALS — BP 101/89 | HR 128 | Temp 98.6°F | Resp 20 | Ht 66.0 in | Wt 188.0 lb

## 2018-11-04 DIAGNOSIS — D6959 Other secondary thrombocytopenia: Secondary | ICD-10-CM | POA: Diagnosis not present

## 2018-11-04 DIAGNOSIS — R600 Localized edema: Secondary | ICD-10-CM

## 2018-11-04 DIAGNOSIS — H532 Diplopia: Secondary | ICD-10-CM

## 2018-11-04 DIAGNOSIS — C9000 Multiple myeloma not having achieved remission: Secondary | ICD-10-CM

## 2018-11-04 DIAGNOSIS — G038 Meningitis due to other specified causes: Secondary | ICD-10-CM

## 2018-11-04 DIAGNOSIS — M62830 Muscle spasm of back: Secondary | ICD-10-CM

## 2018-11-04 DIAGNOSIS — Z79899 Other long term (current) drug therapy: Secondary | ICD-10-CM

## 2018-11-04 LAB — CBC WITH DIFFERENTIAL (CANCER CENTER ONLY)
Abs Immature Granulocytes: 0 K/uL (ref 0.00–0.07)
Band Neutrophils: 5 %
Basophils Absolute: 0 K/uL (ref 0.0–0.1)
Basophils Relative: 0 %
Eosinophils Absolute: 0 K/uL (ref 0.0–0.5)
Eosinophils Relative: 0 %
HCT: 34.1 % — ABNORMAL LOW (ref 39.0–52.0)
Hemoglobin: 12 g/dL — ABNORMAL LOW (ref 13.0–17.0)
Lymphocytes Relative: 15 %
Lymphs Abs: 0.2 K/uL — ABNORMAL LOW (ref 0.7–4.0)
MCH: 31.3 pg (ref 26.0–34.0)
MCHC: 35.2 g/dL (ref 30.0–36.0)
MCV: 88.8 fL (ref 80.0–100.0)
Monocytes Absolute: 0 K/uL — ABNORMAL LOW (ref 0.1–1.0)
Monocytes Relative: 2 %
Neutro Abs: 1.1 K/uL — ABNORMAL LOW (ref 1.7–17.7)
Neutrophils Relative %: 78 %
Platelet Count: 21 K/uL — ABNORMAL LOW (ref 150–400)
RBC: 3.84 MIL/uL — ABNORMAL LOW (ref 4.22–5.81)
RDW: 15.8 % — ABNORMAL HIGH (ref 11.5–15.5)
WBC Count: 1.3 K/uL — ABNORMAL LOW (ref 4.0–10.5)
nRBC: 0 % (ref 0.0–0.2)

## 2018-11-04 LAB — CMP (CANCER CENTER ONLY)
ALT: 94 U/L — ABNORMAL HIGH (ref 0–44)
AST: 142 U/L — ABNORMAL HIGH (ref 15–41)
Albumin: 2.9 g/dL — ABNORMAL LOW (ref 3.5–5.0)
Alkaline Phosphatase: 246 U/L — ABNORMAL HIGH (ref 38–126)
Anion gap: 11 (ref 5–15)
BUN: 24 mg/dL — ABNORMAL HIGH (ref 6–20)
CO2: 24 mmol/L (ref 22–32)
Calcium: 9.9 mg/dL (ref 8.9–10.3)
Chloride: 93 mmol/L — ABNORMAL LOW (ref 98–111)
Creatinine: 0.96 mg/dL (ref 0.61–1.24)
GFR, Est AFR Am: >60 mL/min
GFR, Estimated: >60 mL/min
Glucose, Bld: 118 mg/dL — ABNORMAL HIGH (ref 70–99)
Potassium: 4.2 mmol/L (ref 3.5–5.1)
Sodium: 128 mmol/L — ABNORMAL LOW (ref 135–145)
Total Bilirubin: 1.1 mg/dL (ref 0.3–1.2)
Total Protein: 5.4 g/dL — ABNORMAL LOW (ref 6.5–8.1)

## 2018-11-04 MED ORDER — HYDROMORPHONE HCL 2 MG PO TABS: 2.0000 mg | ORAL_TABLET | ORAL | 0 refills | Status: AC | PRN

## 2018-11-04 NOTE — Progress Notes (Signed)
Per Dr. Benay Spice, needs CBC on 11/07/18 and to see NP on 11/11/18 at [facility]. Scheduling message sent. Patient and wife were instructed today that daratumumab and pomlyst are on hold until further notice. Dr. Benay Spice has discussed case with MD at [facility] and he will be seen there on 11/05/18.

## 2018-11-04 NOTE — Patient Instructions (Signed)
Thrombocytopenia  Thrombocytopenia means that you have a low number of platelets in your blood. Platelets are tiny cells in the blood. When you bleed, they clump together at the cut or injury to stop the bleeding. This is called blood clotting. Not having enough platelets can cause bleeding problems. Follow these instructions at home: General instructions  Check your skin and inside your mouth for bruises or blood as told by your doctor.  Check to see if there is blood in your spit (sputum), pee (urine), and poop (stool). Do this as told by your doctor.  Ask your doctor if you can drink alcohol.  Take over-the-counter and prescription medicines only as told by your doctor.  Tell all of your doctors that you have this condition. Be sure to tell your dentist and eye doctor too. Activity  Do not do activities that can cause bumps or bruises until your doctor says it is okay.  Be careful not to cut yourself: ? When you shave. ? When you use scissors, needles, knives, or other tools.  Be careful not to burn yourself: ? When you use an iron. ? When you cook. Contact a doctor if:  You have bruises and you do not know why. Get help right away if:  You are bleeding anywhere on your body.  You have blood in your spit, pee, or poop. This information is not intended to replace advice given to you by your health care provider. Make sure you discuss any questions you have with your health care provider. Document Released: 10/12/2011 Document Revised: 06/25/2016 Document Reviewed: 04/26/2015 Elsevier Interactive Patient Education  2019 [employer]   Neutropenia Neutropenia is a condition that occurs when you have a lower-than-normal level of a type of white blood cell (neutrophil) in your body. Neutrophils are made in the spongy center of large bones (bone marrow) and they fight infections. Neutrophils are your body's main defense against bacterial and fungal infections. The fewer  neutrophils you have and the longer your body remains without them, the greater your risk of getting a severe infection. What are the causes? This condition can occur if your body uses up or destroys neutrophils faster than your bone marrow can make them. This problem may happen because of:  Bacterial or fungal infection.  Allergic disorders.  Reactions to some medicines.  Autoimmune disease.  An enlarged spleen. This condition can also occur if your bone marrow does not produce enough neutrophils. This problem may be caused by:  Cancer.  Cancer treatments, such as radiation or chemotherapy.  Viral infections.  Medicines, such as phenytoin.  Vitamin B12 deficiency.  Diseases of the bone marrow.  Environmental toxins, such as insecticides. What are the signs or symptoms? This condition does not usually cause symptoms. If symptoms are present, they are usually caused by an underlying infection. Symptoms of an infection may include:  Fever.  Chills.  Swollen glands.  Oral or anal ulcers.  Cough and shortness of breath.  Rash.  Skin infection.  Fatigue. How is this diagnosed? Your health care provider may suspect neutropenia if you have:  A condition that may cause neutropenia.  Symptoms of infection, especially fever.  Frequent and unusual infections. You will have a medical history and physical exam. Tests will also be done, such as:  A complete blood count (CBC).  A procedure to collect a sample of bone marrow for examination (bone marrow biopsy).  A chest X-ray.  A urine culture.  A blood culture. How is this  treated? Treatment depends on the underlying cause and severity of your condition. Mild neutropenia may not require treatment. Treatment may include medicines, such as:  Antibiotic medicine given through an IV tube.  Antiviral medicines.  Antifungal medicines.  A medicine to increase neutrophil production (colony-stimulating factor). You  may get this drug through an IV tube or by injection.  Steroids given through an IV tube. If an underlying condition is causing neutropenia, you may need treatment for that condition. If medicines you are taking are causing neutropenia, your health care provider may have you stop taking those medicines. Follow these instructions at home: Medicines   Take over-the-counter and prescription medicines only as told by your health care provider.  Get a seasonal flu shot (influenza vaccine). Lifestyle  Do not eat unpasteurized foods.Do not eat unwashed raw fruits or vegetables.  Avoid exposure to groups of people or children.  Avoid being around people who are sick.  Avoid being around dirt or dust, such as in construction areas or gardens.  Do not provide direct care for pets. Avoid animal droppings. Do not clean litter boxes and bird cages. Hygiene   Bathe daily.  Clean the area between the genitals and the anus (perineal area) after you urinate or have a bowel movement. If you are male, wipe from front to back.  Brush your teeth with a soft toothbrush before and after meals.  Do not use a razor that has a blade. Use an electric razor to remove hair.  Wash your hands often. Make sure others who come in contact with you also wash their hands. If soap and water are not available, use hand sanitizer. General instructions  Do not have sex unless your health care provider has approved.  Take actions to avoid cuts and burns. For example: ? Be cautious when you use knives. Always cut away from yourself. ? Keep knives in protective sheaths or guards when not in use. ? Use oven mitts when you cook with a hot stove, oven, or grill. ? Stand a safe distance away from open fires.  Avoid people who received a vaccine in the past 30 days if that vaccine contained a live version of the germ (live vaccine). You should not get a live vaccine. Common live vaccines are varicella, measles, mumps,  and rubella.  Do not share food utensils.  Do not use tampons, enemas, or rectal suppositories unless your health care provider has approved.  Keep all appointments as told by your health care provider. This is important. Contact a health care provider if:  You have a fever.  You have chills or you start to shake.  You have: ? A sore throat. ? A warm, red, or tender area on your skin. ? A cough. ? Frequent or painful urination. ? Vaginal discharge or itching.  You develop: ? Sores in your mouth or anus. ? Swollen lymph nodes. ? Red streaks on the skin. ? A rash.  You feel: ? Nauseous or you vomit. ? Very fatigued. ? Short of breath. This information is not intended to replace advice given to you by your health care provider. Make sure you discuss any questions you have with your health care provider. Document Released: 04/14/2002 Document Revised: 06/19/2018 Document Reviewed: 05/05/2015 Elsevier Interactive Patient Education  2019 [employer].

## 2018-11-04 NOTE — Progress Notes (Signed)
Per Dr. Benay Spice: OK to treat today without CMP results. Please wait for CBC results. Per Dr. Benay Spice: OK to treat with pulse 114 MD reviewed CBC results-will not treat today. Calling Dr.Tuchman at [facility] to discuss case. Dr. Benay Spice spoke with MD at [facility]. Hold daratumumab/Pomalyst this week. They will see him tomorrow at [facility]

## 2018-11-04 NOTE — Progress Notes (Signed)
[facility] OFFICE PROGRESS NOTE   Diagnosis: Multiple myeloma  INTERVAL HISTORY:   Paul Green completed a first treatment with daratumumab on 10/29/2018.  He reports tolerating the treatment well.  No sign of allergic reaction or respiratory distress.  He began pomalidomide on 11/01/2018.  He was seen at [facility] 10/31/2018 for intrathecal methotrexate.  The platelet count returned low.  He was transfused platelets and a post transfusion platelet count returned at 59,000.  He received the intrathecal methotrexate.  The CSF cytology revealed no plasma cells.  He reports developing a headache for 2 days following the intrathecal methotrexate.  The headache then resolved.  He had back spasms lasting for several days.  He has been taking Dilaudid every 4 hours for relief of pain.  No change in neurologic symptoms.  Objective:  Vital signs in last 24 hours:  Blood pressure 101/89, pulse (!) 128, temperature 98.6 F (37 C), temperature source Oral, resp. rate 20, height '5\' 6"'  (1.676 m), weight 188 lb (85.3 kg), SpO2 96 %.    HEENT: No thrush, mild conjunctival erythema Resp: Lungs clear bilaterally Cardio: Regular rate and rhythm GI: No hepatosplenomegaly, nontender Vascular: Trace pitting edema at the lower leg bilaterally Neuro: Alert, follows commands, right 6th nerve palsy, the tongue deviates to the right   Lab Results:  Lab Results  Component Value Date   WBC 1.3 (L) 11/04/2018   HGB 12.0 (L) 11/04/2018   HCT 34.1 (L) 11/04/2018   MCV 88.8 11/04/2018   PLT 21 (L) 11/04/2018   NEUTROABS 1.1 (L) 11/04/2018    CMP  Lab Results  Component Value Date   NA 128 (L) 11/04/2018   K 4.2 11/04/2018   CL 93 (L) 11/04/2018   CO2 24 11/04/2018   GLUCOSE 118 (H) 11/04/2018   BUN 24 (H) 11/04/2018   CREATININE 0.96 11/04/2018   CALCIUM 9.9 11/04/2018   PROT 5.4 (L) 11/04/2018   ALBUMIN 2.9 (L) 11/04/2018   AST 142 (H) 11/04/2018   ALT 94 (H) 11/04/2018   ALKPHOS 246 (H) 11/04/2018   BILITOT 1.1 11/04/2018   GFRNONAA >60 11/04/2018   GFRAA >60 11/04/2018    Medications: I have reviewed the patient's current medications.   Assessment/Plan: 1. Multiple myeloma   Anemia/thrombocytopenia  Bone marrow biopsy 01/25/2018-hypercellular bone marrow with extensive involvement by plasma cell neoplasm; flow cytometry with a population of cells with plasmacytic differentiation (13%), no monoclonal B-cell population identified, T cells with nonspecific changes.  Cytogenetics-multiple rearrangements, FISH panel negative for cytogenetic abnormalities to be associated with multiple myeloma including loss of p53  01/24/2018 SPEP-noM spike,elevated serum lambda free light chains (432)  Dexamethasone 40 mg daily times 4 days beginning 01/25/2018  Velcade/dexamethasone weekly x3 followed by a 1 week break beginning3/26/2019(weekly dexamethasone initiated 02/05/2018, Revlimid initiated 02/09/2018)  Lambda light chains improved 02/26/2018  Cycle 2 RVD 03/05/2018 (Velcade dose reduced due to neutropenia), day 8 Velcade held and Revlimid discontinued secondary to recurrent severe neutropenia;day 15 Revlimid resumed x1 week, Velcade held;  Revlimid and Velcade placed on hold 04/02/2018 due to persistent neutropenia.  Bone marrow biopsy at [facility] 04/18/2018-hypercellular marrow, no evidence of myeloma  Cycle 3 RVD started 04/23/2018, Revlimid 5 mg dailyfor 14 days beginning 05/01/2018  Cycle 4 RVD 05/22/2018,Revlimid 5 mg daily for 14 days beginning 05/29/2018  Cycle 5 RVD 06/18/2018, Velcade escalated to standard dosing, Revlimid increased to 10 mg  Cycle 6 RVD 9/10/2019but discontinued after day 1 Velcade  Stem cell harvest at [facility] 08/12/2018  Cycle 7 RVD 08/20/2018  Cycle 8 RVD 09/17/2018, day 15 therapy held secondary to patient vacation  Cycle 9 RVD 10/15/2018, treatment discontinued secondary to neurologic symptoms 10/18/2018  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   [facility] scan [facility] 10/24/2018-multifocal heterogenous intensity throughout the skeleton, soft tissue lesions of the left shoulder and right sixth rib, intense uptake in the spleen, focus of uptake in the gastric fundus  Weekly intrathecal methotrexate 10/24/2018, 10/31/2018  Bone marrow biopsy 10/25/2018- hypercellular marrow with involvement by a plasma cell neoplasm  3 days of pulse Decadron (40 mg) beginning 10/25/2018  Pomalidomide/daratumumab/Decadron cycle 1- 10/29/2018 (pomalidomide started 11/01/2018)  Pomalidomide and daratumumab placed on hold 11/04/2018 secondary to thrombocytopenia 2. Hypercalcemia status post pamidronate 01/24/2018, resolved 3. Diffuse abnormal bone marrow signal, compression fracture of L1 4. Pain secondary to multiple myeloma involving the bones and CNS 5. Anorexia/weight loss-improved 6. 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 Severe neutropenia-recurrent after Revlimid resumed 03/19/2018, resolved 8. Bilateral shoulder pain, neck pain, left arm weakness 10/11/2018-MRI cervical spine- abnormal marrow signal at C2 and upper corner of C3, involvement of the transverse and intervertebral foramen, treated myeloma lesion at C4 9. Pneumonia 10/11/2018-treated with Augmentin and azithromycin 10. Multiple cranial nerve abnormalities beginning 10/18/2018-lumbar puncture 10/21/2018 consistent with involvement by plasma cell neoplasm 11. 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  He has developed severe thrombocytopenia since starting intrathecal methotrexate and pomalidomide/daratumumab/Decadron.  The thrombocytopenia may be related to daratumumab or intrathecal methotrexate.  He had thrombocytopenia prior to starting pomalidomide.  I  discussed the case with Dr. Amalia Hailey.  We decided to place treatment on hold.  He will be seen at [facility] on 11/05/2018.  He will return for a lab visit and scheduled daratumumab here on 11/11/2018.  We will ask him to return later this week for a repeat platelet count.  30 minutes were spent with the patient today.  The majority of the time was used for counseling and coordination of care.  Betsy Coder, MD  11/04/2018  5:14 PM

## 2018-11-04 NOTE — Telephone Encounter (Signed)
Phone call received from patient's wife to schedule visit with Palliative Nurse Practitioner. Scheduled for 11/05/18

## 2018-11-04 NOTE — Progress Notes (Signed)
Patient reports more lethargy and balance is "off". Had 15 hour episode of severe back pain over the weekend, that suddenly resolved. Reports having headache x 2 days after each IT treatment. Asking about having these in [place] when it can be arranged and did express interest in having port placed due to multiple sticks.

## 2018-11-04 NOTE — Telephone Encounter (Signed)
Phone call placed to patient to schedule visit with Palliative Care. Unable to leave message

## 2018-11-05 ENCOUNTER — Ambulatory Visit: Payer: BLUE CROSS/BLUE SHIELD

## 2018-11-05 ENCOUNTER — Other Ambulatory Visit: Payer: BLUE CROSS/BLUE SHIELD | Admitting: Nurse Practitioner

## 2018-11-05 ENCOUNTER — Encounter: Payer: Self-pay | Admitting: Nurse Practitioner

## 2018-11-05 ENCOUNTER — Telehealth: Payer: Self-pay | Admitting: Oncology

## 2018-11-05 DIAGNOSIS — C7949 Secondary malignant neoplasm of other parts of nervous system: Secondary | ICD-10-CM | POA: Diagnosis not present

## 2018-11-05 DIAGNOSIS — D61818 Other pancytopenia: Secondary | ICD-10-CM | POA: Diagnosis not present

## 2018-11-05 DIAGNOSIS — R269 Unspecified abnormalities of gait and mobility: Secondary | ICD-10-CM

## 2018-11-05 DIAGNOSIS — R51 Headache: Secondary | ICD-10-CM | POA: Diagnosis not present

## 2018-11-05 DIAGNOSIS — R05 Cough: Secondary | ICD-10-CM | POA: Diagnosis not present

## 2018-11-05 DIAGNOSIS — G4700 Insomnia, unspecified: Secondary | ICD-10-CM

## 2018-11-05 DIAGNOSIS — Z79899 Other long term (current) drug therapy: Secondary | ICD-10-CM | POA: Diagnosis not present

## 2018-11-05 DIAGNOSIS — D801 Nonfamilial hypogammaglobulinemia: Secondary | ICD-10-CM | POA: Diagnosis not present

## 2018-11-05 DIAGNOSIS — E778 Other disorders of glycoprotein metabolism: Secondary | ICD-10-CM

## 2018-11-05 DIAGNOSIS — R918 Other nonspecific abnormal finding of lung field: Secondary | ICD-10-CM | POA: Diagnosis not present

## 2018-11-05 DIAGNOSIS — G893 Neoplasm related pain (acute) (chronic): Secondary | ICD-10-CM

## 2018-11-05 DIAGNOSIS — R609 Edema, unspecified: Secondary | ICD-10-CM | POA: Diagnosis not present

## 2018-11-05 DIAGNOSIS — C9000 Multiple myeloma not having achieved remission: Secondary | ICD-10-CM | POA: Diagnosis not present

## 2018-11-05 DIAGNOSIS — Z515 Encounter for palliative care: Secondary | ICD-10-CM

## 2018-11-05 DIAGNOSIS — R000 Tachycardia, unspecified: Secondary | ICD-10-CM | POA: Diagnosis not present

## 2018-11-05 DIAGNOSIS — R5383 Other fatigue: Secondary | ICD-10-CM | POA: Diagnosis not present

## 2018-11-05 DIAGNOSIS — D696 Thrombocytopenia, unspecified: Secondary | ICD-10-CM | POA: Diagnosis not present

## 2018-11-05 DIAGNOSIS — R1311 Dysphagia, oral phase: Secondary | ICD-10-CM

## 2018-11-05 NOTE — Progress Notes (Addendum)
[facility] Telephone: [phone] Fax: [phone]  PATIENT NAME: Paul Green DOB: 1960/12/21 MRN: [id number]  PRIMARY CARE PROVIDER:   [name], MD  REFERRING PROVIDER:  [name], MD [place], [place] [address]  RESPONSIBLE PARTY:   Aldahir Litaker (spouse)  [phone]   ASSESSMENT/RRECOMMENDATIONS  Persistent chronic cough with chest congestion -patient with fine crackles in RLL -CXR -incentive spirometry for home use  Insomnia -Consider trazadone 183m at bedtime or quetiapine 535mat bedtime for sleep  Chronic pain 2/2 bone metastasis with recent acute pain crisis -Patient's pain is currently controlled on current regimen of hydromorphone 33m67m2-4 tabs) by mouth every 4 hours as needed for severe pain -may need upward titration or use of immediate release oxycodone liquid during acute pain crisis -may consider addition of long acting opioid   -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 year old male with multiple medical problems including multiple myeloma with leptomeningeal metastases, chronic pain, gait ataxia, oral dysphagia, insomnia. Palliative Care was asked to help with symptom management and to address goals of care.   I spent 60 minutes providing this consultation,  from 10:00 to 11:00. More than 50% of the  time in this consultation was spent coordinating communication.    CODE STATUS: full  PPS: 70% HOSPICE ELIGIBILITY/DIAGNOSIS: TBD  PAST MEDICAL HISTORY:  Past Medical History:  Diagnosis Date  . Hypoglobulinemia   . Le[facility]eal metastases ([facility] . [facility]oma ([facility]   myelomatous meningitis  . Pancytopenia ([facility] . Vertebral compression fracture ([facility])     SOCIAL HX:  Social History   Tobacco Use  . Smoking status: Never Smoker  . Smokeless tobacco: Never Used  Substance Use Topics  . Alcohol use: Yes    Comment: occ    ALLERGIES:  Allergies  Allergen Reactions  . Methocarbamol Other (See Comments)    Caused headaches     PERTINENT MEDICATIONS:  Outpatient Encounter Medications as of 11/05/2018  Medication Sig  . acetaminophen (TYLENOL) 500 MG tablet Take 1,000 mg by mouth every 6 (six) hours as needed for fever or headache.  . aMarland Kitchenyclovir (ZOVIRAX) 400 MG tablet Take 2 tablets (800 mg total) by mouth 3 (three) times daily. (Patient taking differently: Take 400 mg by mouth daily. )  . ALPRAZolam (XANAX) 0.5 MG tablet Take 1 tablet (0.5 mg total) by mouth every 8 (eight) hours as needed for anxiety.  . dMarland Kitchenxamethasone (DECADRON) 4 MG tablet Take #5 tablets (42m38mn day after each IV chemo treatment  . docusate sodium (COLACE) 100 MG capsule Take 100 mg by mouth daily as needed for mild constipation.  . HYMarland KitchenROmorphone (DILAUDID) 2 MG tablet Take 1-2 tablets (2-4 mg total) by mouth every 4 (four) hours as needed for severe pain.  . onMarland Kitchenansetron (ZOFRAN-ODT) 4 MG disintegrating tablet Take 4 mg by mouth 3 (three) times daily as needed for nausea or vomiting.  .Marland Kitchen  polyethylene glycol (MIRALAX / GLYCOLAX) packet Take 17 g by mouth daily as needed.  . pomalidomide (POMALYST) 4 MG capsule Take 1 capsule (4 mg total) by mouth daily. Take with water on days 1-21. Repeat every 28 days.   No facility-administered encounter medications on file as of 11/05/2018.      PHYSICAL EXAM:   General: NAD, WD/WN Cardiovascular: regular rate and rhythm Pulmonary: respirations even and unlabored; RLL with fine crackles otherwise clear fields with good air movement Abdomen: soft, nontender, + bowel sounds GU: no suprapubic tenderness Extremities: 2+ bilateral pitting edema Skin: bruising of arms Neurological: Weakness but otherwise nonfocal  Tarini Carrier G [place], NP

## 2018-11-05 NOTE — Telephone Encounter (Signed)
Scheduled appt per 12/30 sch message - unable to reach patient - left message for patient with appt date and time

## 2018-11-07 ENCOUNTER — Ambulatory Visit: Payer: BLUE CROSS/BLUE SHIELD

## 2018-11-07 ENCOUNTER — Telehealth: Payer: Self-pay | Admitting: Oncology

## 2018-11-07 ENCOUNTER — Inpatient Hospital Stay: Payer: BLUE CROSS/BLUE SHIELD

## 2018-11-07 DIAGNOSIS — D4989 Neoplasm of unspecified behavior of other specified sites: Secondary | ICD-10-CM | POA: Diagnosis not present

## 2018-11-07 DIAGNOSIS — C7949 Secondary malignant neoplasm of other parts of nervous system: Secondary | ICD-10-CM | POA: Diagnosis not present

## 2018-11-07 DIAGNOSIS — Z5111 Encounter for antineoplastic chemotherapy: Secondary | ICD-10-CM | POA: Diagnosis not present

## 2018-11-07 DIAGNOSIS — C9000 Multiple myeloma not having achieved remission: Secondary | ICD-10-CM | POA: Diagnosis not present

## 2018-11-07 NOTE — Telephone Encounter (Signed)
Wife called to follow up on status of getting his daratumumab approved by his [employer]. She received letter in mail that his 1st treatment was denied due to not being approved. This RN had called [facility] on 11/05/18 to request a return call to complete a peer to peer. Called again today and was told MD must call himself and wait to be connected to physician.

## 2018-11-07 NOTE — Telephone Encounter (Signed)
Adjusted infusion time to be after f/u 1/6 (inf log - MX). Left message for patient confirming 1/6 appointments.

## 2018-11-08 ENCOUNTER — Ambulatory Visit: Payer: BLUE CROSS/BLUE SHIELD

## 2018-11-08 ENCOUNTER — Telehealth: Payer: Self-pay | Admitting: *Deleted

## 2018-11-08 NOTE — Telephone Encounter (Signed)
Call from [facility] to confirm patient will have labs on 11/11/18. Reported that he had platelet transfusion and received his IT treatment yesterday as well. Called wife to confirm his appointments for 11/11/18. She is asking if adding an antidepressant could help him and his pain? She will discuss this with MD on Monday.

## 2018-11-11 ENCOUNTER — Encounter: Payer: Self-pay | Admitting: Nurse Practitioner

## 2018-11-11 ENCOUNTER — Ambulatory Visit: Payer: BLUE CROSS/BLUE SHIELD

## 2018-11-11 ENCOUNTER — Inpatient Hospital Stay (HOSPITAL_BASED_OUTPATIENT_CLINIC_OR_DEPARTMENT_OTHER): Payer: BLUE CROSS/BLUE SHIELD | Admitting: Nurse Practitioner

## 2018-11-11 ENCOUNTER — Inpatient Hospital Stay: Payer: BLUE CROSS/BLUE SHIELD | Attending: Oncology

## 2018-11-11 ENCOUNTER — Inpatient Hospital Stay: Payer: BLUE CROSS/BLUE SHIELD

## 2018-11-11 ENCOUNTER — Encounter: Payer: Self-pay | Admitting: *Deleted

## 2018-11-11 VITALS — BP 128/88 | HR 108 | Temp 98.0°F | Resp 18

## 2018-11-11 VITALS — BP 112/78 | HR 117 | Temp 97.6°F | Resp 18 | Ht 66.0 in | Wt 186.6 lb

## 2018-11-11 DIAGNOSIS — C9000 Multiple myeloma not having achieved remission: Secondary | ICD-10-CM

## 2018-11-11 DIAGNOSIS — Z5112 Encounter for antineoplastic immunotherapy: Secondary | ICD-10-CM | POA: Diagnosis not present

## 2018-11-11 DIAGNOSIS — D61818 Other pancytopenia: Secondary | ICD-10-CM | POA: Diagnosis not present

## 2018-11-11 DIAGNOSIS — Z79899 Other long term (current) drug therapy: Secondary | ICD-10-CM | POA: Insufficient documentation

## 2018-11-11 DIAGNOSIS — M545 Low back pain: Secondary | ICD-10-CM | POA: Insufficient documentation

## 2018-11-11 DIAGNOSIS — R110 Nausea: Secondary | ICD-10-CM | POA: Diagnosis not present

## 2018-11-11 DIAGNOSIS — K5900 Constipation, unspecified: Secondary | ICD-10-CM | POA: Diagnosis not present

## 2018-11-11 LAB — CBC WITH DIFFERENTIAL (CANCER CENTER ONLY)
Abs Immature Granulocytes: 0.04 10*3/uL (ref 0.00–0.07)
Basophils Absolute: 0 10*3/uL (ref 0.0–0.1)
Basophils Relative: 0 %
Eosinophils Absolute: 0 10*3/uL (ref 0.0–0.5)
Eosinophils Relative: 1 %
HCT: 25 % — ABNORMAL LOW (ref 39.0–52.0)
Hemoglobin: 8.8 g/dL — ABNORMAL LOW (ref 13.0–17.0)
Immature Granulocytes: 5 %
LYMPHS ABS: 0.4 10*3/uL — AB (ref 0.7–4.0)
Lymphocytes Relative: 49 %
MCH: 31.3 pg (ref 26.0–34.0)
MCHC: 35.2 g/dL (ref 30.0–36.0)
MCV: 89 fL (ref 80.0–100.0)
Monocytes Absolute: 0.1 10*3/uL (ref 0.1–1.0)
Monocytes Relative: 10 %
Neutro Abs: 0.3 10*3/uL — CL (ref 1.7–7.7)
Neutrophils Relative %: 35 %
Platelet Count: 11 10*3/uL — ABNORMAL LOW (ref 150–400)
RBC: 2.81 MIL/uL — ABNORMAL LOW (ref 4.22–5.81)
RDW: 15.5 % (ref 11.5–15.5)
WBC Count: 0.9 10*3/uL — CL (ref 4.0–10.5)
nRBC: 0 % (ref 0.0–0.2)

## 2018-11-11 LAB — CMP (CANCER CENTER ONLY)
ALT: 31 U/L (ref 0–44)
AST: 52 U/L — ABNORMAL HIGH (ref 15–41)
Albumin: 3 g/dL — ABNORMAL LOW (ref 3.5–5.0)
Alkaline Phosphatase: 112 U/L (ref 38–126)
Anion gap: 13 (ref 5–15)
BUN: 24 mg/dL — ABNORMAL HIGH (ref 6–20)
CO2: 23 mmol/L (ref 22–32)
Calcium: 9.4 mg/dL (ref 8.9–10.3)
Chloride: 97 mmol/L — ABNORMAL LOW (ref 98–111)
Creatinine: 1.79 mg/dL — ABNORMAL HIGH (ref 0.61–1.24)
GFR, EST NON AFRICAN AMERICAN: 41 mL/min — AB (ref >60)
GFR, Est AFR Am: 47 mL/min — ABNORMAL LOW (ref >60)
Glucose, Bld: 139 mg/dL — ABNORMAL HIGH (ref 70–99)
Potassium: 3.6 mmol/L (ref 3.5–5.1)
Sodium: 133 mmol/L — ABNORMAL LOW (ref 135–145)
TOTAL PROTEIN: 5.7 g/dL — AB (ref 6.5–8.1)
Total Bilirubin: 0.6 mg/dL (ref 0.3–1.2)

## 2018-11-11 LAB — CULTURE, FUNGUS WITHOUT SMEAR

## 2018-11-11 MED ORDER — HYDROMORPHONE HCL 1 MG/ML IJ SOLN
INTRAMUSCULAR | Status: AC
  Filled 2018-11-11: qty 1

## 2018-11-11 MED ORDER — SODIUM CHLORIDE 0.9% IV SOLUTION
250.0000 mL | Freq: Once | INTRAVENOUS | Status: AC
  Administered 2018-11-11: 250 mL via INTRAVENOUS
  Filled 2018-11-11: qty 250

## 2018-11-11 MED ORDER — DIPHENHYDRAMINE HCL 25 MG PO CAPS
ORAL_CAPSULE | ORAL | Status: AC
  Filled 2018-11-11: qty 2

## 2018-11-11 MED ORDER — HYDROMORPHONE HCL 1 MG/ML IJ SOLN
1.0000 mg | Freq: Once | INTRAMUSCULAR | Status: AC
  Administered 2018-11-11: 1 mg via INTRAVENOUS

## 2018-11-11 MED ORDER — LORAZEPAM 0.5 MG PO TABS: 0.5000 mg | ORAL_TABLET | Freq: Every evening | ORAL | 0 refills | Status: AC | PRN

## 2018-11-11 MED ORDER — SODIUM CHLORIDE 0.9 % IV SOLN
20.0000 mg | Freq: Once | INTRAVENOUS | Status: AC
  Administered 2018-11-11: 20 mg via INTRAVENOUS
  Filled 2018-11-11: qty 2

## 2018-11-11 MED ORDER — SODIUM CHLORIDE 0.9 % IV SOLN
Freq: Once | INTRAVENOUS | Status: AC
  Administered 2018-11-11: 10:00:00 via INTRAVENOUS
  Filled 2018-11-11: qty 250

## 2018-11-11 MED ORDER — SODIUM CHLORIDE 0.9 % IV SOLN
1400.0000 mg | Freq: Once | INTRAVENOUS | Status: AC
  Administered 2018-11-11: 1400 mg via INTRAVENOUS
  Filled 2018-11-11: qty 60

## 2018-11-11 MED ORDER — HYDROMORPHONE HCL 1 MG/ML IJ SOLN
1.0000 mg | Freq: Once | INTRAMUSCULAR | Status: AC
  Administered 2018-11-11: 1 mg via INTRAVENOUS
  Filled 2018-11-11: qty 1

## 2018-11-11 MED ORDER — DIPHENHYDRAMINE HCL 25 MG PO CAPS
50.0000 mg | ORAL_CAPSULE | Freq: Once | ORAL | Status: AC
  Administered 2018-11-11: 50 mg via ORAL

## 2018-11-11 MED ORDER — ACETAMINOPHEN 325 MG PO TABS
650.0000 mg | ORAL_TABLET | Freq: Once | ORAL | Status: AC
  Administered 2018-11-11: 650 mg via ORAL

## 2018-11-11 MED ORDER — ACETAMINOPHEN 325 MG PO TABS
ORAL_TABLET | ORAL | Status: AC
  Filled 2018-11-11: qty 2

## 2018-11-11 NOTE — Progress Notes (Addendum)
[facility] OFFICE PROGRESS NOTE   Diagnosis: Multiple myeloma  INTERVAL HISTORY:   Paul Green returns as scheduled.  Pomalidomide and daratumumab placed on hold 11/04/2018 secondary to thrombocytopenia.  He received intrathecal cytarabine 11/07/2018.  He was transfused platelets prior to the lumbar puncture.  He began having low back pain yesterday.  No leg weakness or numbness.  He notes constipation with last bowel movement 2 days ago.  No incontinence of bowel or bladder.  He is taking Dilaudid every 4 hours as well as periodic Tylenol.  He is taking pain medication for pain in various locations.  He has had no headache for the past 3 days.  He has mild nausea.  He denies bleeding.  No fever or chills.  His wife reports a persistent cough.  He continues to have double vision.  He has now being followed by an ophthalmologist.  He is having sleep issues.  Xanax does not help.  Objective:  Vital signs in last 24 hours:  Blood pressure 112/78, pulse (!) 117, temperature 97.6 F (36.4 C), temperature source Oral, resp. rate 18, height '5\' 6"'  (1.676 m), weight 186 lb 9.6 oz (84.6 kg), SpO2 98 %.    HEENT: Mild white coating over tongue.   Resp: Lungs clear bilaterally. Cardio: Regular rate and rhythm. GI: Abdomen soft and nontender.  No hepatosplenomegaly. Vascular: 1+ pitting edema at the lower legs bilaterally. Neuro: Alert and oriented.  Follows commands.  Right 6th nerve palsy.  Tongue deviates to the right. Skin: Lower legs with dry skin.  Large ecchymosis right forearm.   Lab Results:  Lab Results  Component Value Date   WBC 0.9 (LL) 11/11/2018   HGB 8.8 (L) 11/11/2018   HCT 25.0 (L) 11/11/2018   MCV 89.0 11/11/2018   PLT 11 (L) 11/11/2018   NEUTROABS PENDING 11/11/2018    Imaging:  No results found.  Medications: I have reviewed the patient's current medications.  Assessment/Plan: 1. Multiple myeloma   Anemia/thrombocytopenia  Bone marrow biopsy  01/25/2018-hypercellular bone marrow with extensive involvement by plasma cell neoplasm; flow cytometry with a population of cells with plasmacytic differentiation (13%), no monoclonal B-cell population identified, T cells with nonspecific changes.  Cytogenetics-multiple rearrangements, FISH panel negative for cytogenetic abnormalities to be associated with multiple myeloma including loss of p53  01/24/2018 SPEP-noM spike,elevated serum lambda free light chains (432)  Dexamethasone 40 mg daily times 4 days beginning 01/25/2018  Velcade/dexamethasone weekly x3 followed by a 1 week break beginning3/26/2019(weekly dexamethasone initiated 02/05/2018, Revlimid initiated 02/09/2018)  Lambda light chains improved 02/26/2018  Cycle 2 RVD 03/05/2018 (Velcade dose reduced due to neutropenia), day 8 Velcade held and Revlimid discontinued secondary to recurrent severe neutropenia;day 15 Revlimid resumed x1 week, Velcade held;  Revlimid and Velcade placed on hold 04/02/2018 due to persistent neutropenia.  Bone marrow biopsy at [facility] 04/18/2018-hypercellular marrow, no evidence of myeloma  Cycle 3 RVD started 04/23/2018, Revlimid 5 mg dailyfor 14 days beginning 05/01/2018  Cycle 4 RVD 05/22/2018,Revlimid 5 mg daily for 14 days beginning 05/29/2018  Cycle 5 RVD 06/18/2018, Velcade escalated to standard dosing, Revlimid increased to 10 mg  Cycle 6 RVD 9/10/2019but discontinued after day 1 Velcade  Stem cell harvest at [facility] 08/12/2018  Cycle 7 RVD 08/20/2018  Cycle 8 RVD 09/17/2018, day 15 therapy held secondary to patient vacation  Cycle 9 RVD 10/15/2018, treatment discontinued secondary to neurologic symptoms 10/18/2018  Myelomatous meningitis confirmed on lumbar puncture 10/21/2018   PET scan at [facility] 10/24/2018-multifocal heterogenous intensity  throughout the skeleton, soft tissue lesions of the left shoulder and right sixth rib, intense uptake in the spleen, focus of uptake in the gastric  fundus  Weekly intrathecal methotrexate 10/24/2018, 10/31/2018  Bone marrow biopsy 10/25/2018- hypercellular marrow with involvement by a plasma cell neoplasm  3 days of pulse Decadron (40 mg) beginning 10/25/2018  Pomalidomide/daratumumab/Decadron cycle 1- 10/29/2018 (pomalidomide started 11/01/2018)  Pomalidomide and daratumumab placed on hold 11/04/2018 secondary to thrombocytopenia  Pomalidomide and daratumumab resumed 11/11/2018 2. Hypercalcemia status post pamidronate 01/24/2018, resolved 3. Diffuse abnormal bone marrow signal, compression fracture of L1 4. Pain secondary to multiple myeloma involving the bones and CNS 5. Anorexia/weight loss-improved 6. 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 Severe neutropenia-recurrent after Revlimid resumed 03/19/2018, resolved 8. Bilateral shoulder pain, neck pain, left arm weakness 10/11/2018-MRI cervical spine- abnormal marrow signal at C2 and upper corner of C3, involvement of the transverse and intervertebral foramen, treated myeloma lesion at C4 9. Pneumonia 10/11/2018-treated with Augmentin and azithromycin 10. Multiple cranial nerve abnormalities beginning 10/18/2018-lumbar puncture 10/21/2018 consistent with involvement by plasma cell neoplasm 11. Thrombocytopenia- secondary to intrathecal methotrexate, daratumumab, or multiple myeloma involving the bone marrow   Disposition: Paul Green appears unchanged.  He continues to have significant pancytopenia.  This is felt to be due to disease progression.  Dr. Benay Spice has spoken with Dr. Amalia Hailey.  The plan is to resume pomalidomide and daratumumab today.  Paul Green and his wife are in agreement.  We reviewed the CBC from today.  He has severe thrombocytopenia.  He will receive 1 unit of platelets with a 1 hour post count.  He also has severe neutropenia.  They understand to call for fever, chills, other signs of infection, bleeding.  Peripheral access is  becoming an issue.  Dr. Benay Spice discussed this with Dr. Amalia Hailey.  Arrangements will be made for him to have a Port-A-Cath placed at [facility] when he is there later this week.  For the sleep issue he will discontinue Xanax and try Ativan 0.5 mg 1 to 2 tablets at bedtime as needed.  He will follow-up at [facility] as scheduled later this week.  He will return for follow-up here as scheduled 11/18/2018.  He will contact the office in the interim as outlined above or with any other problems.  Patient seen with Dr. Benay Spice.  25 minutes were spent face-to-face at today's visit with the majority of that time involved in counseling/coordination of care.    Ned Card ANP/GNP-BC   11/11/2018  9:21 AM  This was a shared visit with Ned Card.  Mr. Gaymon was interviewed and examined.  He appears unchanged.  He has progressive pancytopenia.  I discussed the case with Dr. Amalia Hailey.  He feels the cytopenias are related to tumor involving the bone marrow complicated by treatment.  He recommends continuing daratumumab and pomalidomide. Paul Green will receive platelets today.  We will check a posttransfusion platelet count.  He is scheduled to be seen at [facility] within the next few days.  Dr. Amalia Hailey will attempt to arrange for Port-A-Cath placement at [facility].  He will return for an office visit in 1 week.  Julieanne Manson, MD Julieanne Manson, MD

## 2018-11-11 NOTE — Patient Instructions (Signed)
[facility] Discharge Instructions for Patients Receiving Chemotherapy  Today you received the following chemotherapy agents: Daratumumab (Darzalex)  To help prevent nausea and vomiting after your treatment, we encourage you to take your nausea medication as directed.    If you develop nausea and vomiting that is not controlled by your nausea medication, call the clinic.   BELOW ARE SYMPTOMS THAT SHOULD BE REPORTED IMMEDIATELY:  *FEVER GREATER THAN 100.5 F  *CHILLS WITH OR WITHOUT FEVER  NAUSEA AND VOMITING THAT IS NOT CONTROLLED WITH YOUR NAUSEA MEDICATION  *UNUSUAL SHORTNESS OF BREATH  *UNUSUAL BRUISING OR BLEEDING  TENDERNESS IN MOUTH AND THROAT WITH OR WITHOUT PRESENCE OF ULCERS  *URINARY PROBLEMS  *BOWEL PROBLEMS  UNUSUAL RASH Items with * indicate a potential emergency and should be followed up as soon as possible.  Feel free to call the clinic should you have any questions or concerns. The clinic phone number is [phone].  Please show the [facility] at check-in to the Emergency Department and triage nurse.  Platelet Transfusion A platelet transfusion is a procedure in which you receive donated platelets through an IV. Platelets are tiny pieces of blood cells. When you get an injury, platelets clump together in the area to form a blood clot. This helps stop bleeding and is the beginning of the healing process. If you have too few platelets, your blood may have trouble clotting. This may cause you to bleed and bruise very easily. You may need a platelet transfusion if you have a condition that causes a low number of platelets (thrombocytopenia). A platelet transfusion may be used to stop or prevent excessive bleeding. Tell a health care provider about:  Any reactions you have had during previous transfusions.  Any allergies you have.  All medicines you are taking, including vitamins, herbs, eye drops, creams, and over-the-counter  medicines.  Any blood disorders you have.  Any surgeries you have had.  Any medical conditions you have.  Whether you are pregnant or may be pregnant. What are the risks? Generally, this is a safe procedure. However, problems may occur, including:  Fever.  Infection.  Allergic reaction to the donor platelets.  Your body's disease-fighting system (immune system) attacking the donor platelets (hemolytic reaction). This is rare.  A rare reaction that causes lung damage (transfusion-related acute lung injury). What happens before the procedure? Medicines  Ask your health care provider about: ? Changing or stopping your regular medicines. This is especially important if you are taking diabetes medicines or blood thinners. ? Taking medicines such as aspirin and ibuprofen. These medicines can thin your blood. Do not take these medicines unless your health care provider tells you to take them. ? Taking over-the-counter medicines, vitamins, herbs, and supplements. General instructions  You will have a blood test to determine your blood type. Your blood type determines what kind of platelets you will be given.  Follow instructions from your health care provider about eating or drinking restrictions.  If you have had an allergic reaction to a transfusion in the past, you may be given medicine to help prevent a reaction.  Your temperature, blood pressure, pulse, and breathing will be monitored. What happens during the procedure?   An IV will be inserted into one of your veins.  For your safety, two health care providers will verify your identity along with the donor platelets about to be infused.  A bag of donor platelets will be connected to your IV. The platelets will flow into  your bloodstream. This usually takes 30-60 minutes.  Your temperature, blood pressure, pulse, and breathing will be monitored during the transfusion. This helps detect early signs of any reaction.  You  will also be monitored for other symptoms that may indicate a reaction, including chills, hives, or itching.  If you have signs of a reaction at any time, your transfusion will be stopped, and you may be given medicine to help manage the reaction.  When your transfusion is complete, your IV will be removed.  Pressure may be applied to the IV site for a few minutes to stop any bleeding.  The IV site will be covered with a bandage (dressing). The procedure may vary among health care providers and hospitals. What happens after the procedure?  Your blood pressure, temperature, pulse, and breathing will be monitored until you leave the hospital or clinic.  You may have some bruising and soreness at your IV site. Follow these instructions at home: Medicines  Take over-the-counter and prescription medicines only as told by your health care provider.  Talk with your health care provider before you take any medicines that contain aspirin or NSAIDs. These medicines increase your risk for dangerous bleeding. General instructions  Change or remove your dressing as told by your health care provider.  Return to your normal activities as told by your health care provider. Ask your health care provider what activities are safe for you.  Do not take baths, swim, or use a hot tub until your health care provider approves. Ask your health care provider if you may take showers.  Check your IV site every day for signs of infection. Check for: ? Redness, swelling, or pain. ? Fluid or blood. If fluid or blood drains from your IV site, use your hands to press down firmly on a bandage covering the area for a minute or two. Doing this should stop the bleeding. ? Warmth. ? Pus or a bad smell.  Keep all follow-up visits as told by your health care provider. This is important. Contact a health care provider if you have:  A headache that does not go away with medicine.  Hives, rash, or itchy skin.  Nausea  or vomiting.  Unusual tiredness or weakness.  Signs of infection at your IV site. Get help right away if:  You have a fever or chills.  You urinate less often than usual.  Your urine is darker colored than normal.  You have any of the following: ? Trouble breathing. ? Pain in your back, abdomen, or chest. ? Cool, clammy skin. ? A fast heartbeat. Summary  Platelets are tiny pieces of blood cells that clump together to form a blood clot when you have an injury. If you have too few platelets, your blood may have trouble clotting.  A platelet transfusion is a procedure in which you receive donated platelets through an IV.  A platelet transfusion may be used to stop or prevent excessive bleeding.  After the procedure, check your IV site every day for signs of infection, including redness, swelling, pain, or warmth. This information is not intended to replace advice given to you by your health care provider. Make sure you discuss any questions you have with your health care provider. Document Released: 08/20/2007 Document Revised: 11/28/2017 Document Reviewed: 11/28/2017 Elsevier Interactive Patient Education  2019 [employer].

## 2018-11-11 NOTE — Progress Notes (Signed)
Call from Peter Congo, [place] with [employer] regarding authorization for daratumumab (Code [phone] [phone]). Left her number [phone] ext [phone]. Case re-opened and supporting documentation needs to be received by 11/14/18.Marland Kitchen She is faxing an acknowledgement letter to office at [phone].  Forwarded information to [employer] in managed  Care.

## 2018-11-11 NOTE — Progress Notes (Signed)
Per Marlynn Perking: OK to treat with abnormal lab work and elevated heart rate. Pt will also be receiving 1 unit of platelets today.   Per Lattie Haw and Dr. Benay Spice: OK to draw post platelet CBC after 30 min (pt doesn't have to wait an hour)

## 2018-11-12 ENCOUNTER — Ambulatory Visit: Payer: BLUE CROSS/BLUE SHIELD

## 2018-11-12 ENCOUNTER — Telehealth: Payer: Self-pay | Admitting: *Deleted

## 2018-11-12 LAB — BPAM PLATELET PHERESIS
Blood Product Expiration Date: 202001072359
ISSUE DATE / TIME: 202001061627
Unit Type and Rh: 8400

## 2018-11-12 LAB — PREPARE PLATELET PHERESIS: Unit division: 0

## 2018-11-12 NOTE — Telephone Encounter (Addendum)
Paul Green had couple episodes of significant shortness of breath: one going up stairs to take a shower and then later when he was bending over. Doing better now and had no chest pain during this. She is aware of his anemia (Hgb 8.8 on 1/6). Having no fever or chills, just very fatigued. Just wanted to report the incidents. Going back to [facility] on 1/8 and 1/9 and will have labs checked again.

## 2018-11-13 ENCOUNTER — Telehealth: Payer: Self-pay | Admitting: *Deleted

## 2018-11-13 DIAGNOSIS — J9811 Atelectasis: Secondary | ICD-10-CM | POA: Diagnosis not present

## 2018-11-13 DIAGNOSIS — R000 Tachycardia, unspecified: Secondary | ICD-10-CM | POA: Diagnosis not present

## 2018-11-13 DIAGNOSIS — R9431 Abnormal electrocardiogram [ECG] [EKG]: Secondary | ICD-10-CM | POA: Diagnosis not present

## 2018-11-13 DIAGNOSIS — R0602 Shortness of breath: Secondary | ICD-10-CM | POA: Diagnosis not present

## 2018-11-13 DIAGNOSIS — R05 Cough: Secondary | ICD-10-CM | POA: Diagnosis not present

## 2018-11-13 DIAGNOSIS — Z6830 Body mass index (BMI) 30.0-30.9, adult: Secondary | ICD-10-CM | POA: Diagnosis not present

## 2018-11-13 DIAGNOSIS — C9000 Multiple myeloma not having achieved remission: Secondary | ICD-10-CM | POA: Diagnosis not present

## 2018-11-13 DIAGNOSIS — N179 Acute kidney failure, unspecified: Secondary | ICD-10-CM | POA: Diagnosis not present

## 2018-11-13 DIAGNOSIS — E872 Acidosis: Secondary | ICD-10-CM | POA: Diagnosis not present

## 2018-11-13 DIAGNOSIS — J9601 Acute respiratory failure with hypoxia: Secondary | ICD-10-CM | POA: Diagnosis not present

## 2018-11-13 DIAGNOSIS — R140 Abdominal distension (gaseous): Secondary | ICD-10-CM | POA: Diagnosis not present

## 2018-11-13 DIAGNOSIS — R109 Unspecified abdominal pain: Secondary | ICD-10-CM | POA: Diagnosis not present

## 2018-11-13 DIAGNOSIS — Z6831 Body mass index (BMI) 31.0-31.9, adult: Secondary | ICD-10-CM | POA: Diagnosis not present

## 2018-11-13 DIAGNOSIS — C9002 Multiple myeloma in relapse: Secondary | ICD-10-CM | POA: Diagnosis not present

## 2018-11-13 DIAGNOSIS — R0789 Other chest pain: Secondary | ICD-10-CM | POA: Diagnosis not present

## 2018-11-13 NOTE — Telephone Encounter (Signed)
Wife called to report he was admitted to [facility] today and is now in ICU. Hgb went to 6.0, platelets 11,000 and getting short of breath and sats dropping. Says Dr. Amalia Hailey not sure what is going on. Just wanted to give update.

## 2018-11-14 DIAGNOSIS — R0902 Hypoxemia: Secondary | ICD-10-CM | POA: Diagnosis not present

## 2018-11-14 DIAGNOSIS — C9002 Multiple myeloma in relapse: Secondary | ICD-10-CM | POA: Diagnosis not present

## 2018-11-14 DIAGNOSIS — I517 Cardiomegaly: Secondary | ICD-10-CM | POA: Diagnosis not present

## 2018-11-14 DIAGNOSIS — N179 Acute kidney failure, unspecified: Secondary | ICD-10-CM | POA: Diagnosis not present

## 2018-11-14 DIAGNOSIS — R918 Other nonspecific abnormal finding of lung field: Secondary | ICD-10-CM | POA: Diagnosis not present

## 2018-11-14 DIAGNOSIS — M7989 Other specified soft tissue disorders: Secondary | ICD-10-CM | POA: Diagnosis not present

## 2018-11-14 DIAGNOSIS — E872 Acidosis: Secondary | ICD-10-CM | POA: Diagnosis not present

## 2018-11-14 DIAGNOSIS — J9601 Acute respiratory failure with hypoxia: Secondary | ICD-10-CM | POA: Diagnosis not present

## 2018-11-15 DIAGNOSIS — N179 Acute kidney failure, unspecified: Secondary | ICD-10-CM | POA: Diagnosis not present

## 2018-11-15 DIAGNOSIS — E872 Acidosis: Secondary | ICD-10-CM | POA: Diagnosis not present

## 2018-11-15 DIAGNOSIS — J9601 Acute respiratory failure with hypoxia: Secondary | ICD-10-CM | POA: Diagnosis not present

## 2018-11-15 DIAGNOSIS — C9002 Multiple myeloma in relapse: Secondary | ICD-10-CM | POA: Diagnosis not present

## 2018-11-15 MED ORDER — CEFEPIME HCL 2 GM/100ML IV SOLN
2.00 | INTRAVENOUS | Status: DC
Start: 2018-11-15 — End: 2018-11-15

## 2018-11-15 MED ORDER — LORAZEPAM 1 MG PO TABS
.50 | ORAL_TABLET | ORAL | Status: DC
Start: ? — End: 2018-11-15

## 2018-11-15 MED ORDER — POLYETHYLENE GLYCOL 3350 17 G PO PACK
17.00 | PACK | ORAL | Status: DC
Start: 2018-11-18 — End: 2018-11-15

## 2018-11-15 MED ORDER — SODIUM CHLORIDE 0.9 % IV SOLN
75.00 | INTRAVENOUS | Status: DC
Start: ? — End: 2018-11-15

## 2018-11-15 MED ORDER — MORPHINE SULFATE 4 MG/ML IJ SOLN
2.00 | INTRAMUSCULAR | Status: DC
Start: ? — End: 2018-11-15

## 2018-11-15 MED ORDER — GENERIC EXTERNAL MEDICATION
2.00 | Status: DC
Start: 2018-11-18 — End: 2018-11-15

## 2018-11-15 MED ORDER — GENERIC EXTERNAL MEDICATION
Status: DC
Start: ? — End: 2018-11-15

## 2018-11-15 MED ORDER — LIDOCAINE 5 % EX PTCH
1.00 | MEDICATED_PATCH | CUTANEOUS | Status: DC
Start: 2018-11-18 — End: 2018-11-15

## 2018-11-15 MED ORDER — THIAMINE HCL 100 MG/ML IJ SOLN
200.00 | INTRAMUSCULAR | Status: DC
Start: 2018-11-15 — End: 2018-11-15

## 2018-11-15 MED ORDER — POMALIDOMIDE 4 MG PO CAPS
4.00 | ORAL_CAPSULE | ORAL | Status: DC
Start: 2018-11-15 — End: 2018-11-15

## 2018-11-15 MED ORDER — LORAZEPAM 2 MG/ML IJ SOLN
.50 | INTRAMUSCULAR | Status: DC
Start: ? — End: 2018-11-15

## 2018-11-15 MED ORDER — ONDANSETRON 4 MG PO TBDP
4.00 | ORAL_TABLET | ORAL | Status: DC
Start: ? — End: 2018-11-15

## 2018-11-15 MED ORDER — HYDROMORPHONE HCL 2 MG PO TABS
2.00 | ORAL_TABLET | ORAL | Status: DC
Start: ? — End: 2018-11-15

## 2018-11-15 MED ORDER — POTASSIUM CHLORIDE CRYS ER 10 MEQ PO TBCR
40.00 | EXTENDED_RELEASE_TABLET | ORAL | Status: DC
Start: 2018-11-15 — End: 2018-11-15

## 2018-11-15 MED ORDER — GENERIC EXTERNAL MEDICATION
2.00 | Status: DC
Start: 2018-11-15 — End: 2018-11-15

## 2018-11-15 MED ORDER — ACYCLOVIR 800 MG PO TABS
400.00 | ORAL_TABLET | ORAL | Status: DC
Start: 2018-11-19 — End: 2018-11-15

## 2018-11-15 MED ORDER — AMLODIPINE BESYLATE 5 MG PO TABS
5.00 | ORAL_TABLET | ORAL | Status: DC
Start: 2018-11-16 — End: 2018-11-15

## 2018-11-18 ENCOUNTER — Inpatient Hospital Stay: Payer: BLUE CROSS/BLUE SHIELD | Admitting: Nurse Practitioner

## 2018-11-18 ENCOUNTER — Inpatient Hospital Stay: Payer: BLUE CROSS/BLUE SHIELD

## 2018-11-18 ENCOUNTER — Telehealth: Payer: Self-pay | Admitting: *Deleted

## 2018-11-18 MED ORDER — MORPHINE SULFATE ER 15 MG PO TBCR
30.00 | EXTENDED_RELEASE_TABLET | ORAL | Status: DC
Start: 2018-11-18 — End: 2018-11-18

## 2018-11-18 MED ORDER — HYDROMORPHONE HCL 2 MG PO TABS
2.00 | ORAL_TABLET | ORAL | Status: DC
Start: ? — End: 2018-11-18

## 2018-11-18 MED ORDER — MORPHINE SULFATE 4 MG/ML IJ SOLN
4.00 | INTRAMUSCULAR | Status: DC
Start: ? — End: 2018-11-18

## 2018-11-18 MED ORDER — LOPERAMIDE HCL 2 MG PO CAPS
2.00 | ORAL_CAPSULE | ORAL | Status: DC
Start: ? — End: 2018-11-18

## 2018-11-18 MED ORDER — GENERIC EXTERNAL MEDICATION
10.00 | Status: DC
Start: 2018-11-18 — End: 2018-11-18

## 2018-11-18 MED ORDER — LOPERAMIDE HCL 2 MG PO CAPS
4.00 | ORAL_CAPSULE | ORAL | Status: DC
Start: ? — End: 2018-11-18

## 2018-11-18 MED ORDER — MORPHINE SULFATE 4 MG/ML IJ SOLN
4.00 | INTRAMUSCULAR | Status: DC
Start: 2018-11-18 — End: 2018-11-18

## 2018-11-18 MED ORDER — ONDANSETRON 4 MG PO TBDP
4.00 | ORAL_TABLET | ORAL | Status: DC
Start: ? — End: 2018-11-18

## 2018-11-18 MED ORDER — MORPHINE SULFATE 2 MG/ML IJ SOLN
2.00 | INTRAMUSCULAR | Status: DC
Start: ? — End: 2018-11-18

## 2018-11-18 MED ORDER — PROCHLORPERAZINE MALEATE 10 MG PO TABS
10.00 | ORAL_TABLET | ORAL | Status: DC
Start: ? — End: 2018-11-18

## 2018-11-18 NOTE — Telephone Encounter (Signed)
Daughter called to report Saxton is being transferred today to [facility] in [place] for end of life care. No further treatment options and Amonte is in pain and tired and is ready to give up. Thanked Dr. Benay Spice and staff for all the wonderful care and genuine caring attitude. Informed her that Dr. Benay Spice will be notified and he does plan to make rounds there tomorrow. Also informed by manage care that the daratumumab treatment was finally approved with insurance.

## 2018-11-19 ENCOUNTER — Inpatient Hospital Stay: Payer: BLUE CROSS/BLUE SHIELD

## 2018-11-21 ENCOUNTER — Encounter: Payer: Self-pay | Admitting: Radiation Oncology

## 2018-11-21 ENCOUNTER — Other Ambulatory Visit: Payer: Self-pay | Admitting: Oncology

## 2018-11-21 DIAGNOSIS — C9000 Multiple myeloma not having achieved remission: Secondary | ICD-10-CM

## 2018-11-21 NOTE — Progress Notes (Signed)
  Radiation Oncology         [phone] ________________________________  Name: Paul Green MRN: [id number]  Date: 11/21/2018  DOB: [birth year]  End of Treatment Note  Diagnosis:   58 y.o. male with Multiple Myeloma not having achieved remission with pain and radiculopathy on the left along the C6 dermatome     Indication for treatment:  Palliative       Site/Planned dose:   Cervical Spine (C3) / 30 Gy in 12 fractions  Narrative: Due to additional clinical findings, the patient did not receive any radiation treatment.  Plan: The patient will return to radiation oncology clinic PRN.  ------------------------------------------------  Jodelle Gross, MD, PhD  This document serves as a record of services personally performed by Kyung Rudd, MD. It was created on his behalf by Rae Lips, a trained medical scribe. The creation of this record is based on the scribe's personal observations and the provider's statements to them. This document has been checked and approved by the attending provider.

## 2018-11-25 ENCOUNTER — Other Ambulatory Visit: Payer: BLUE CROSS/BLUE SHIELD

## 2018-11-25 ENCOUNTER — Ambulatory Visit: Payer: BLUE CROSS/BLUE SHIELD

## 2018-12-05 ENCOUNTER — Other Ambulatory Visit: Payer: Self-pay | Admitting: Nurse Practitioner

## 2018-12-05 DIAGNOSIS — C9000 Multiple myeloma not having achieved remission: Secondary | ICD-10-CM

## 2018-12-07 DEATH — deceased

## 2020-04-13 IMAGING — CR DG CHEST 2V
2 series · 2 of 2 positions shown · non-contrast
Comparison: 01/23/2018

CLINICAL DATA: 57-year-old male with multiple myeloma and cough

EXAM:
CHEST - 2 VIEW

[w chest pa]
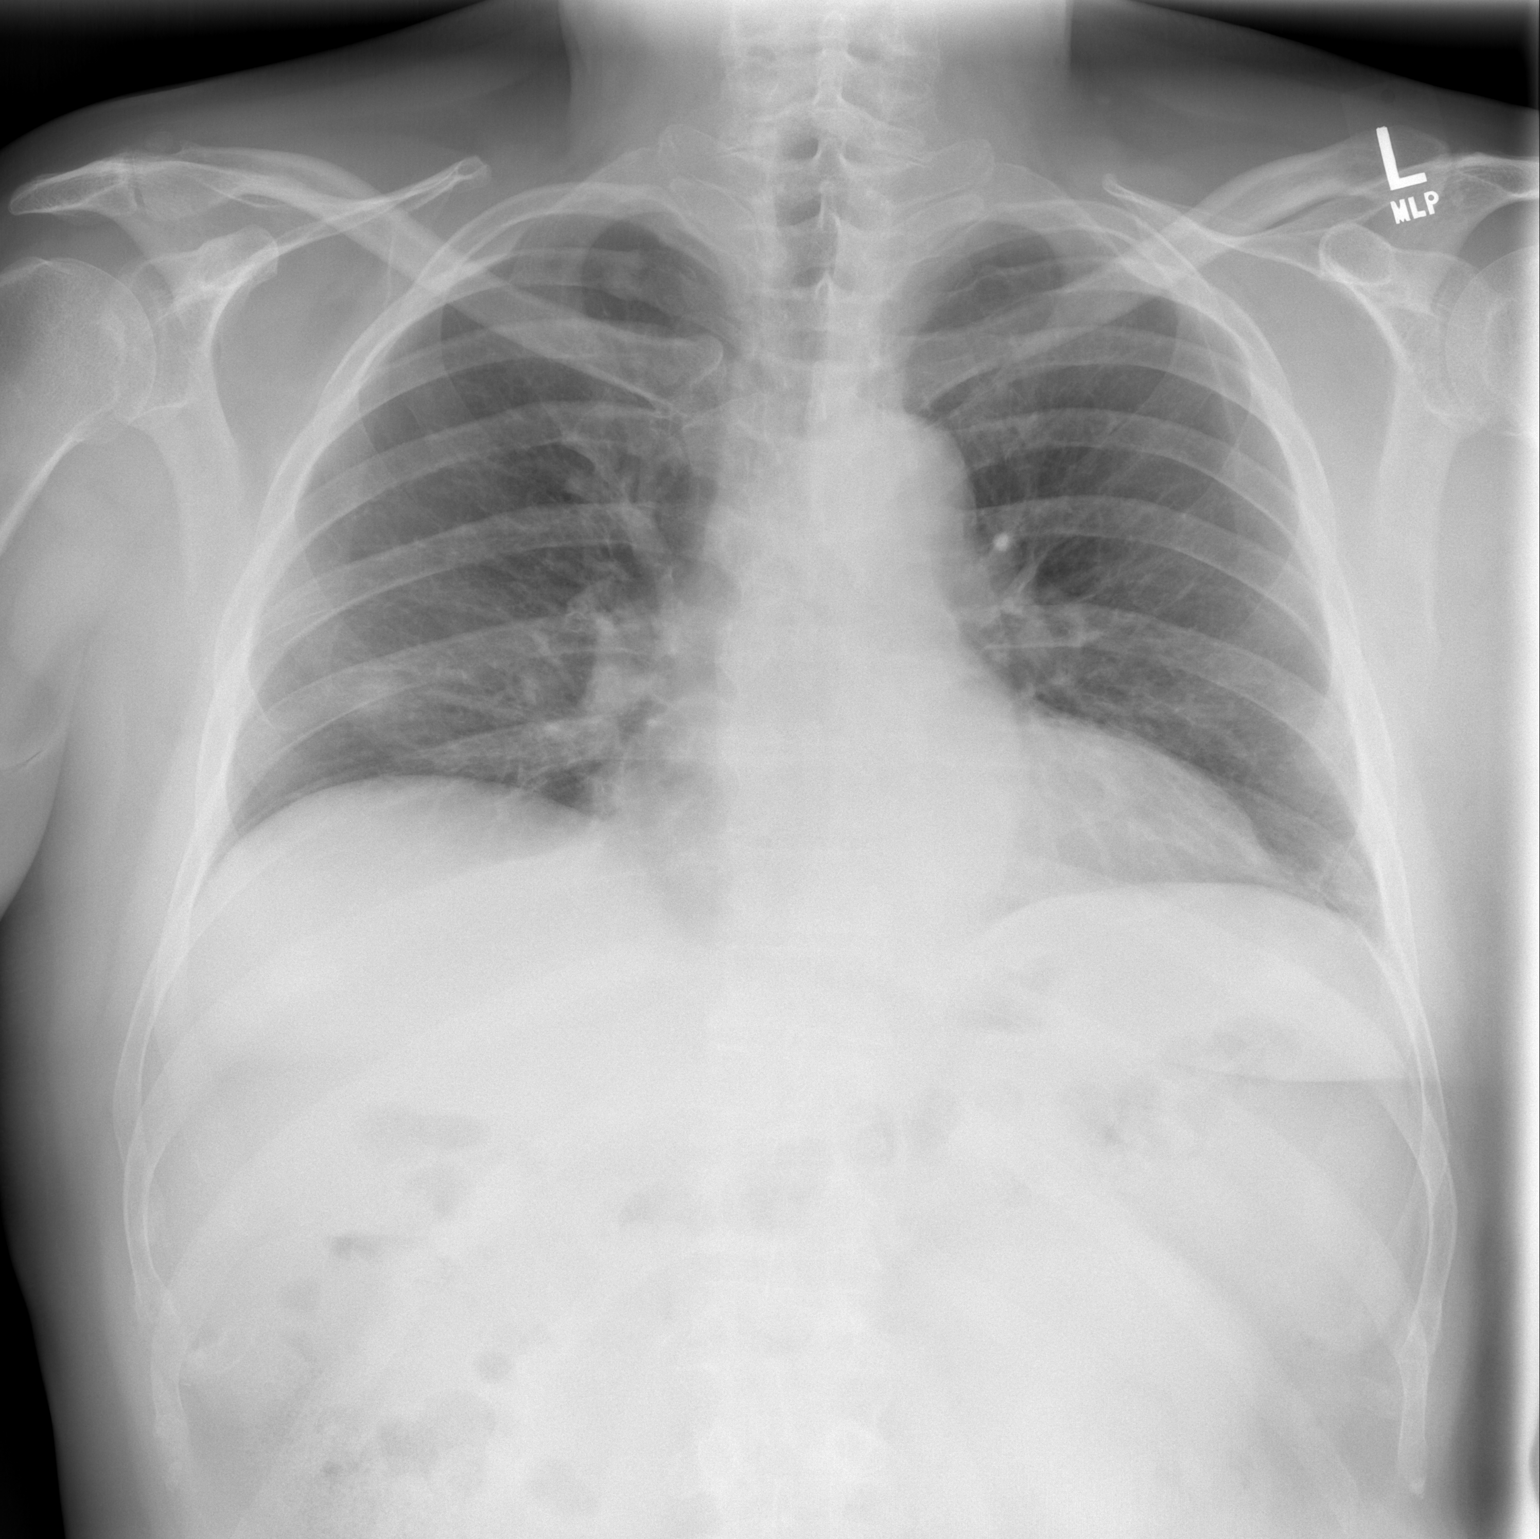

[w chest lat]
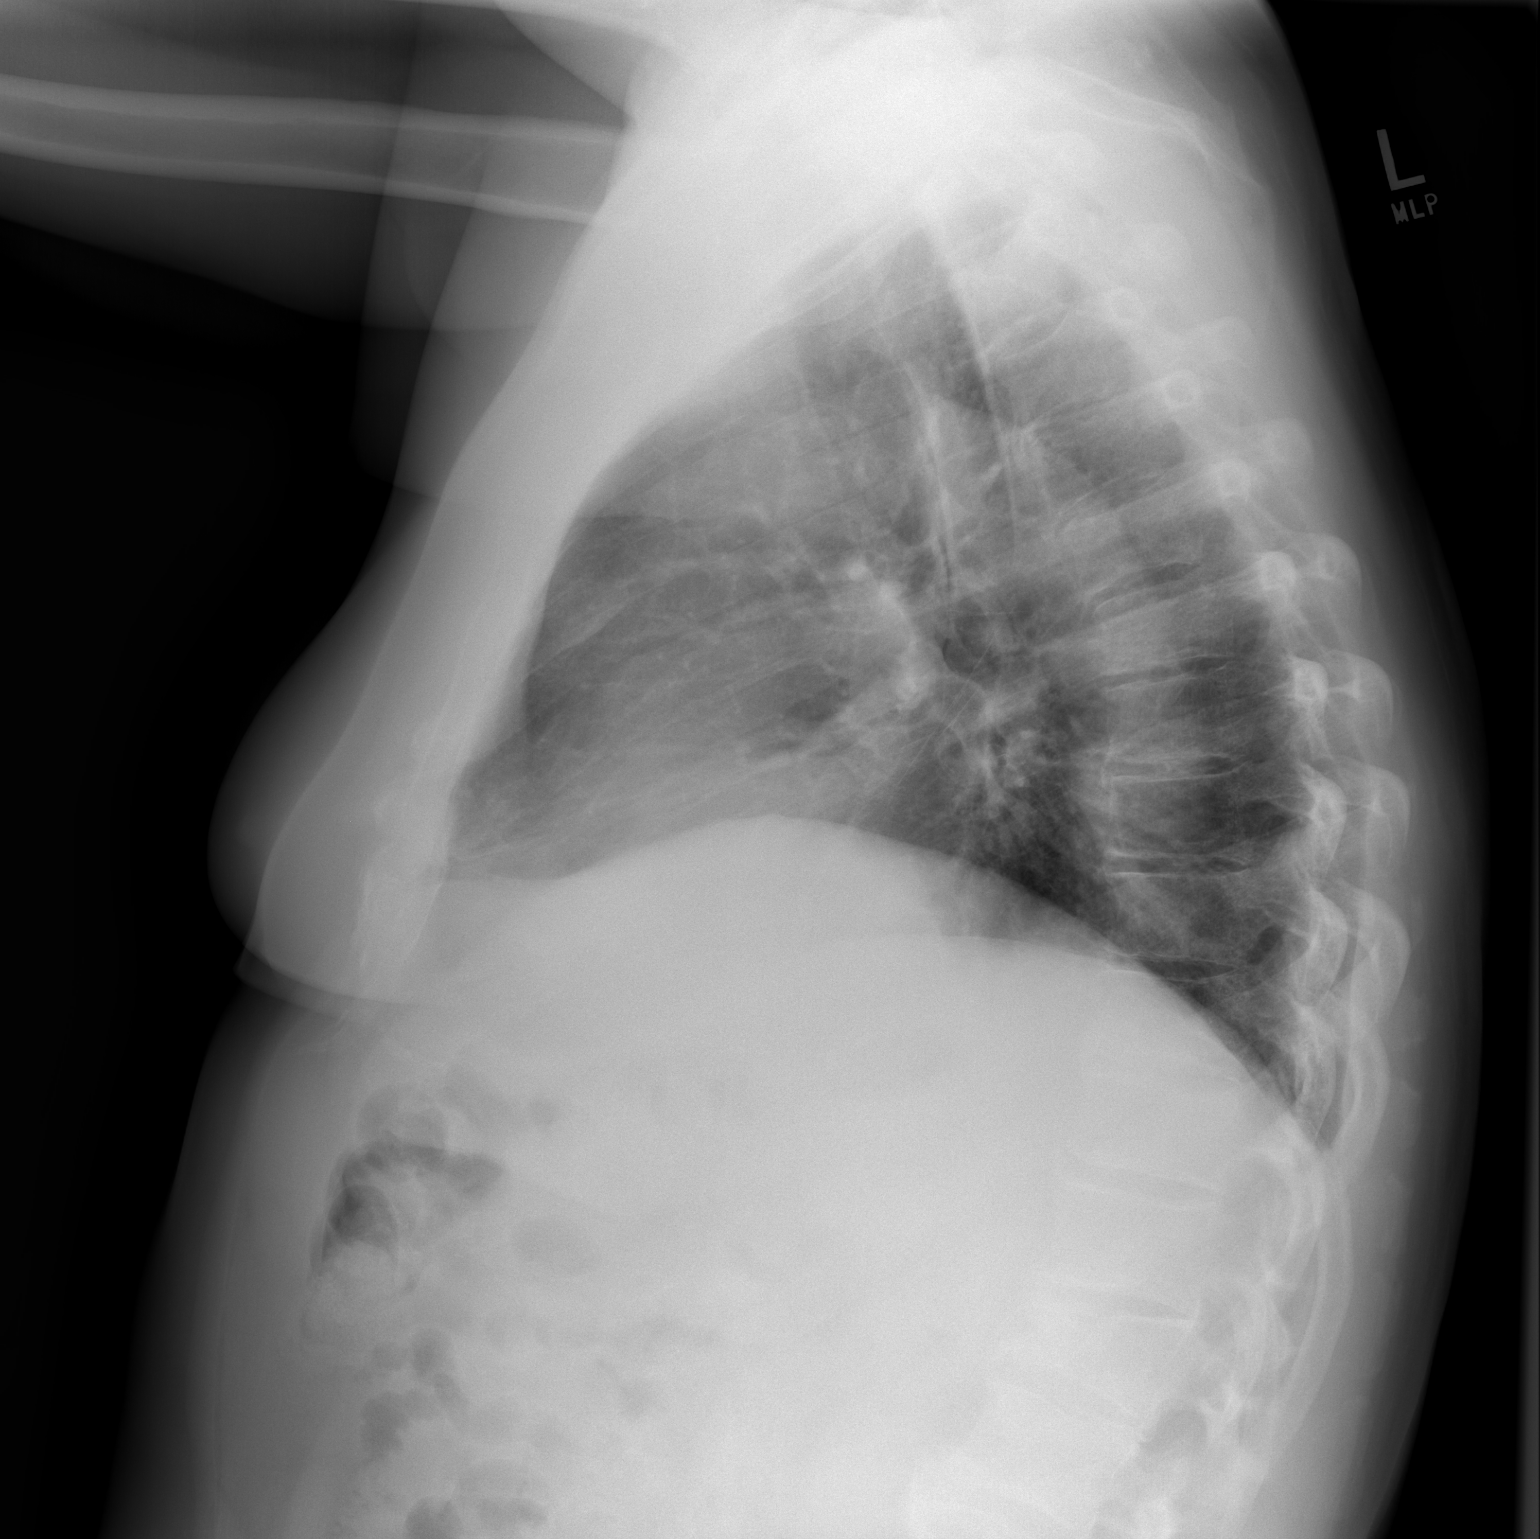

[2 of 2 positions shown; findings below may reference images not displayed]

FINDINGS: Cardiomediastinal silhouette unchanged in size and contour. Low lung
volumes. Patchy airspace opacity in the right lower lung. No
pneumothorax. No pleural effusion.

No displaced fracture.  Degenerative changes.
IMPRESSION: Low lung volumes, with airspace opacity in the right lower lung
concerning for infection given the history. Followup PA and lateral
chest X-ray is recommended in 3-4 weeks following treatment to
assure resolution.

## 2020-04-23 IMAGING — RF DG FLUORO GUIDE NDL PLC/BX
4 series · 4 of 4 positions shown · non-contrast
Comparison: none

CLINICAL DATA: Multiple myeloma with focal neurologic deficit.
Clinical concern for CNS involvement.

[Series 1: fluoro_iodine 2fps_bw · 0.20mm/px · 1 of 1 slices shown (1 of 2)]
[im 1/1]
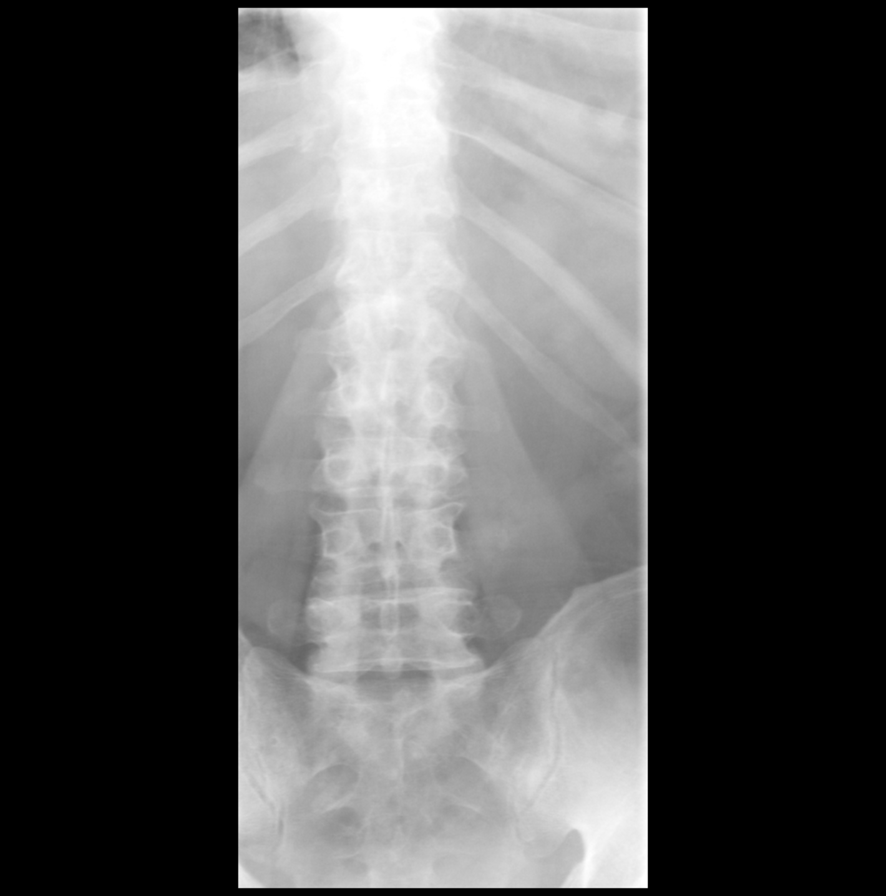

[Series 2: fluoro_iodine 2fps_bw · 0.20mm/px · 1 of 1 slices shown (2 of 2)]
[im 1/1]
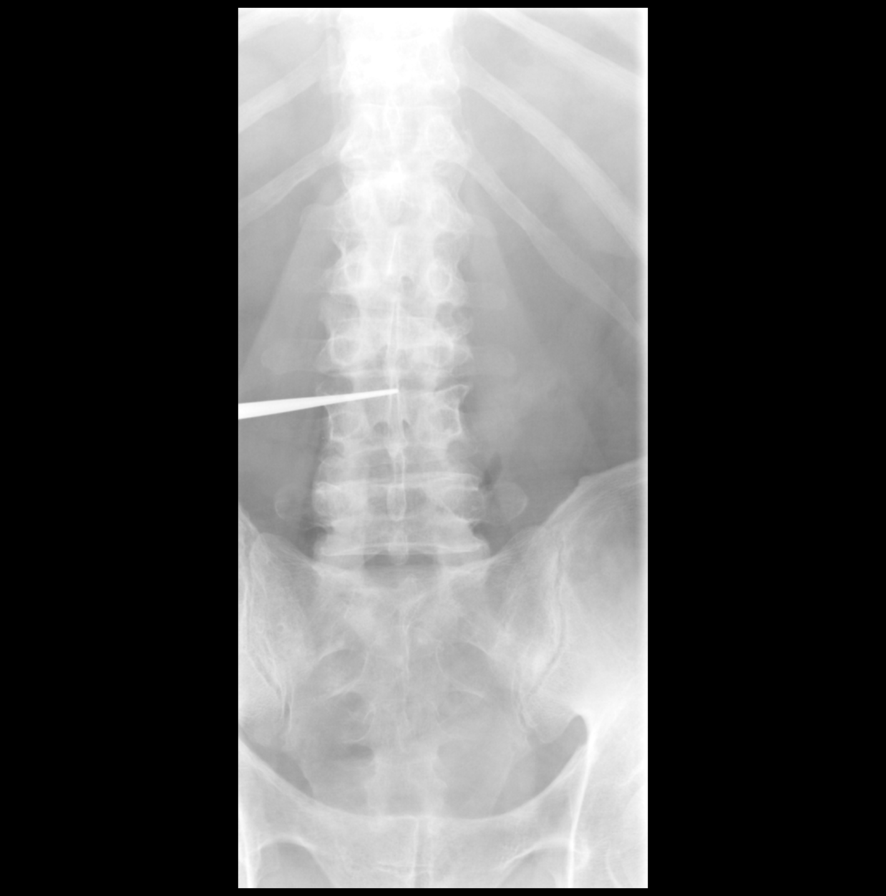

[Series 3: cp_standard · 0.31mm/px · 1 of 1 slices shown (1 of 2)]
[im 1/1]
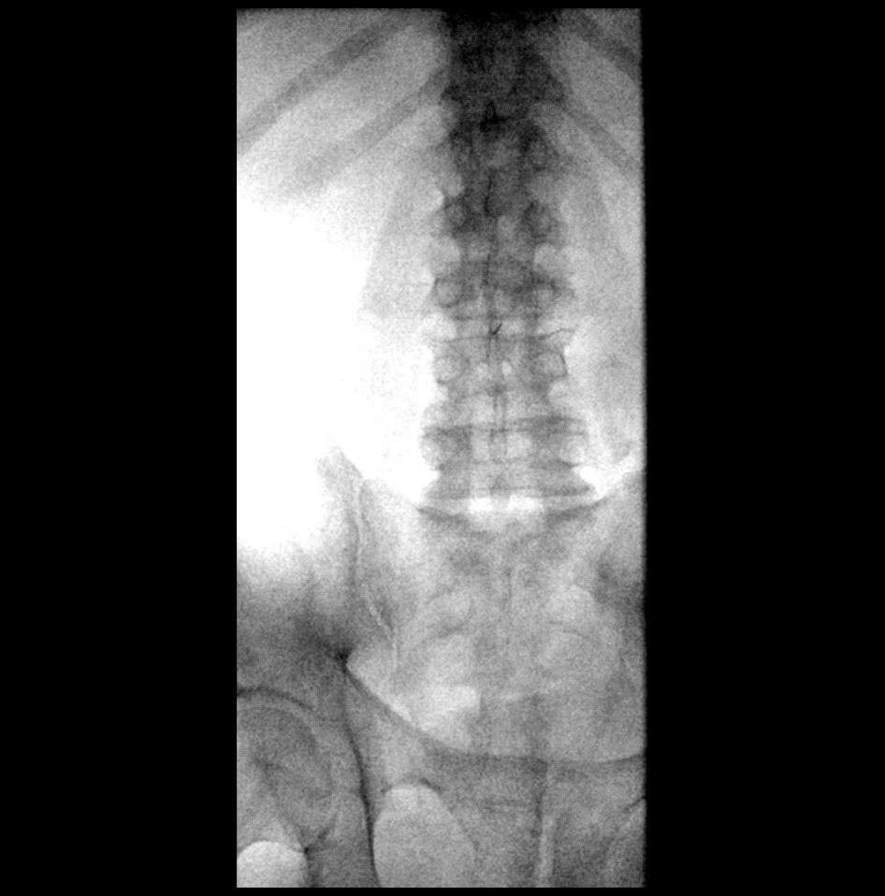

[Series 4: cp_standard · 0.29mm/px · 1 of 1 slices shown (2 of 2)]
[im 1/1]
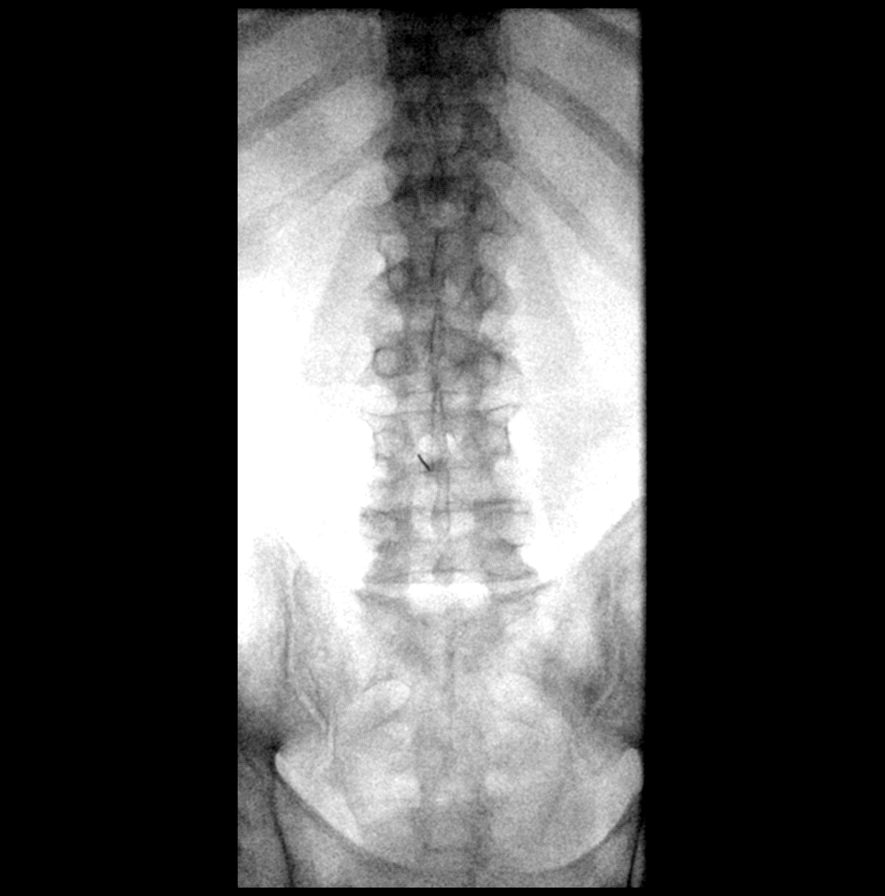

[4 of 4 positions shown; findings below may reference images not displayed]

EXAM:
DIAGNOSTIC LUMBAR PUNCTURE UNDER FLUOROSCOPIC GUIDANCE

FLUOROSCOPY TIME:  Fluoroscopy Time:  0 minutes an 40 seconds

Radiation Exposure Index (if provided by the fluoroscopic device):
8.9 mGy

Number of Acquired Spot Images:

PROCEDURE:
Prior to beginning the procedure, risks, benefits, and alternatives
were discussed with the patient. Emphasize risks included bleeding,
infection, inability to stain CSF, headache, and spinal hematoma.
Patient voiced understanding and wished to proceed. Written informed
consent was obtained.

Patient time-out was performed prior to fluoroscopic localization.

With the patient prone, fluoroscopic guidance was used to localize
the L3-4 and L4-5 levels. The lower back was prepped with Betadine.
1% Lidocaine was used for local anesthesia. Initial attempt was
performed at the L3-4 level using a 20 gauge needle, but spinal
canal could not be localized. Subsequent repositioning of the needle
at L4-5 resulted in return of clear CSF on the first pass. Opening
pressure of 10 cm water. 9 ml of CSF were obtained for laboratory
studies. The patient tolerated the procedure well and there were no
apparent complications.
IMPRESSION: 1. Successful fluoro guided lumbar puncture at L4-5 with 9 cc clear
CSF obtained.
2. Patient tolerated the procedure well without immediate
complications.
